# Patient Record
Sex: Male | Born: 1935
Health system: Southern US, Community
[De-identification: ages and names within clinical notes are randomized; demographics above are authoritative.]

## PROBLEM LIST (undated history)

## (undated) DIAGNOSIS — I2510 Atherosclerotic heart disease of native coronary artery without angina pectoris: Secondary | ICD-10-CM

## (undated) DIAGNOSIS — Z87442 Personal history of urinary calculi: Secondary | ICD-10-CM

## (undated) DIAGNOSIS — Z9289 Personal history of other medical treatment: Secondary | ICD-10-CM

## (undated) DIAGNOSIS — J189 Pneumonia, unspecified organism: Secondary | ICD-10-CM

## (undated) DIAGNOSIS — Z951 Presence of aortocoronary bypass graft: Secondary | ICD-10-CM

## (undated) DIAGNOSIS — E785 Hyperlipidemia, unspecified: Secondary | ICD-10-CM

## (undated) DIAGNOSIS — I10 Essential (primary) hypertension: Secondary | ICD-10-CM

## (undated) DIAGNOSIS — I4891 Unspecified atrial fibrillation: Secondary | ICD-10-CM

## (undated) HISTORY — DX: Unspecified atrial fibrillation: I48.91

## (undated) HISTORY — PX: ESOPHAGOGASTRODUODENOSCOPY: SHX1529

## (undated) HISTORY — DX: Atherosclerotic heart disease of native coronary artery without angina pectoris: I25.10

## (undated) HISTORY — DX: Essential (primary) hypertension: I10

## (undated) HISTORY — PX: TONSILLECTOMY AND ADENOIDECTOMY: SUR1326

## (undated) HISTORY — DX: Hyperlipidemia, unspecified: E78.5

## (undated) HISTORY — DX: Presence of aortocoronary bypass graft: Z95.1

---

## 1990-01-27 HISTORY — PX: CORONARY ARTERY BYPASS GRAFT: SHX141

## 2013-05-27 DIAGNOSIS — K922 Gastrointestinal hemorrhage, unspecified: Secondary | ICD-10-CM

## 2013-05-27 DIAGNOSIS — I639 Cerebral infarction, unspecified: Secondary | ICD-10-CM

## 2013-05-27 HISTORY — DX: Gastrointestinal hemorrhage, unspecified: K92.2

## 2013-05-27 HISTORY — DX: Cerebral infarction, unspecified: I63.9

## 2020-03-22 ENCOUNTER — Other Ambulatory Visit (HOSPITAL_COMMUNITY): Payer: Self-pay | Admitting: Internal Medicine

## 2020-03-22 DIAGNOSIS — R0600 Dyspnea, unspecified: Secondary | ICD-10-CM

## 2020-03-22 DIAGNOSIS — R0609 Other forms of dyspnea: Secondary | ICD-10-CM

## 2020-04-16 ENCOUNTER — Ambulatory Visit (HOSPITAL_COMMUNITY): Payer: Medicare Other | Attending: Cardiovascular Disease

## 2020-04-16 ENCOUNTER — Other Ambulatory Visit: Payer: Self-pay

## 2020-04-16 DIAGNOSIS — R0600 Dyspnea, unspecified: Secondary | ICD-10-CM | POA: Diagnosis present

## 2020-04-16 DIAGNOSIS — R0609 Other forms of dyspnea: Secondary | ICD-10-CM

## 2020-04-16 LAB — ECHOCARDIOGRAM COMPLETE
P 1/2 time: 588 msec
S' Lateral: 3.2 cm

## 2020-04-23 ENCOUNTER — Telehealth: Payer: Self-pay

## 2020-04-23 NOTE — Telephone Encounter (Signed)
This will need to be forwarded to the Medical records department at [facility] office.

## 2020-04-23 NOTE — Telephone Encounter (Signed)
Patient's wife called and wanted to see if we can fax over his Echo results to his cardiologist Dr. Daryel November. Fax # is [phone].

## 2020-05-15 ENCOUNTER — Other Ambulatory Visit: Payer: Self-pay | Admitting: Internal Medicine

## 2020-05-15 DIAGNOSIS — R0609 Other forms of dyspnea: Secondary | ICD-10-CM

## 2020-05-15 DIAGNOSIS — R0600 Dyspnea, unspecified: Secondary | ICD-10-CM

## 2020-05-28 ENCOUNTER — Other Ambulatory Visit: Payer: Self-pay

## 2020-05-28 ENCOUNTER — Ambulatory Visit
Admission: RE | Admit: 2020-05-28 | Discharge: 2020-05-28 | Disposition: A | Discharge status: Home / Self Care | Payer: Medicare Other | Source: Ambulatory Visit | Attending: Internal Medicine | Admitting: Internal Medicine

## 2020-05-28 DIAGNOSIS — R0609 Other forms of dyspnea: Secondary | ICD-10-CM

## 2020-05-28 DIAGNOSIS — R0600 Dyspnea, unspecified: Secondary | ICD-10-CM

## 2020-06-28 ENCOUNTER — Ambulatory Visit (INDEPENDENT_AMBULATORY_CARE_PROVIDER_SITE_OTHER): Payer: Medicare Other | Admitting: Pulmonary Disease

## 2020-06-28 ENCOUNTER — Encounter: Payer: Self-pay | Admitting: Pulmonary Disease

## 2020-06-28 ENCOUNTER — Other Ambulatory Visit: Payer: Self-pay

## 2020-06-28 VITALS — BP 120/72 | HR 58 | Temp 98.1°F | Ht 72.0 in | Wt 205.6 lb

## 2020-06-28 DIAGNOSIS — R269 Unspecified abnormalities of gait and mobility: Secondary | ICD-10-CM | POA: Insufficient documentation

## 2020-06-28 DIAGNOSIS — E785 Hyperlipidemia, unspecified: Secondary | ICD-10-CM | POA: Insufficient documentation

## 2020-06-28 DIAGNOSIS — D6859 Other primary thrombophilia: Secondary | ICD-10-CM | POA: Insufficient documentation

## 2020-06-28 DIAGNOSIS — E559 Vitamin D deficiency, unspecified: Secondary | ICD-10-CM | POA: Insufficient documentation

## 2020-06-28 DIAGNOSIS — I6990 Unspecified sequelae of unspecified cerebrovascular disease: Secondary | ICD-10-CM | POA: Insufficient documentation

## 2020-06-28 DIAGNOSIS — J84112 Idiopathic pulmonary fibrosis: Secondary | ICD-10-CM | POA: Diagnosis not present

## 2020-06-28 DIAGNOSIS — J449 Chronic obstructive pulmonary disease, unspecified: Secondary | ICD-10-CM

## 2020-06-28 DIAGNOSIS — N281 Cyst of kidney, acquired: Secondary | ICD-10-CM | POA: Insufficient documentation

## 2020-06-28 DIAGNOSIS — I4891 Unspecified atrial fibrillation: Secondary | ICD-10-CM | POA: Insufficient documentation

## 2020-06-28 DIAGNOSIS — R0600 Dyspnea, unspecified: Secondary | ICD-10-CM | POA: Insufficient documentation

## 2020-06-28 DIAGNOSIS — M21372 Foot drop, left foot: Secondary | ICD-10-CM | POA: Insufficient documentation

## 2020-06-28 DIAGNOSIS — N401 Enlarged prostate with lower urinary tract symptoms: Secondary | ICD-10-CM | POA: Insufficient documentation

## 2020-06-28 DIAGNOSIS — R0609 Other forms of dyspnea: Secondary | ICD-10-CM | POA: Insufficient documentation

## 2020-06-28 DIAGNOSIS — R609 Edema, unspecified: Secondary | ICD-10-CM | POA: Insufficient documentation

## 2020-06-28 DIAGNOSIS — D692 Other nonthrombocytopenic purpura: Secondary | ICD-10-CM | POA: Insufficient documentation

## 2020-06-28 DIAGNOSIS — I10 Essential (primary) hypertension: Secondary | ICD-10-CM | POA: Insufficient documentation

## 2020-06-28 MED ORDER — ANORO ELLIPTA 62.5-25 MCG/INH IN AEPB: 1.0000 | INHALATION_SPRAY | Freq: Every day | RESPIRATORY_TRACT | 0 refills | Status: DC

## 2020-06-28 NOTE — Progress Notes (Signed)
Subjective:   PATIENT ID: Calvin Brown GENDER: male DOB: 04/27/1933, MRN: [id number]   HPI  Chief Complaint  Patient presents with   Consult    Referred by PCP Dr. Thornell Mule for increased SOB for the last year. Had a CT scan back on 05/28/20 that was abnormal. Denies any coughing or chest pain.     Reason for Visit: New consult for ILD  [name] is a 87 year old male former smoker with CAD s/p CABG, COPD, HTN, CVA, atrial fibrillation Xarelto.  He was referred by his PCP, Dr. Thornell Mule, for findings of ILD on recent CT Chest. Note from 05/04/20 reviewed. Shortness suspected from deconditioning. Wife provides additional history documented below.  He reports gradually shortness of breath that worsened in the last year. Occasional wheezing at night. He has had limited activity including walking, biking. Less active in the last few years. He is participating in physical therapy for left foot drop (7 years ago) stumbling.   Reportedly had COPD on PFTs 10 years day. He has been taking Anoro 6 weeks ago and has not needed to use his albuterol. Wheezing may be improved on inhaler.  Social History: Retired Investment banker, operational  I have personally reviewed patient's past medical/family/social history, allergies, current medications.  Past Medical History:  Diagnosis Date   CAD (coronary artery disease)    CVA (cerebral vascular accident) (HCC) 05/27/2013   GI bleed 05/27/2013   HTN (hypertension)    Hx of CABG    Possibly 2015?     Family History  Problem Relation Age of Onset   Vision loss Mother    Breast cancer Mother 9   Hypertension Father    Heart attack Sister 19     Social History   Occupational History   Not on file  Tobacco Use   Smoking status: Former Smoker    Packs/day: 2.00    Types: Cigarettes    Start date: 01/28/1955    Quit date: 01/28/1967    Years since quitting: 53.4   Smokeless tobacco: Never Used  Substance and Sexual Activity   Alcohol use: Not on  file   Drug use: Not on file   Sexual activity: Not on file    No Known Allergies   Outpatient Medications Prior to Visit  Medication Sig Dispense Refill   amLODipine (NORVASC) 5 MG tablet 1 tablet     lisinopril-hydrochlorothiazide (ZESTORETIC) 20-12.5 MG tablet Take 1 tablet by mouth daily.     Rivaroxaban (XARELTO) 15 MG TABS tablet Take 15 mg by mouth daily.     rosuvastatin (CRESTOR) 20 MG tablet Take 20 mg by mouth daily.     tamsulosin (FLOMAX) 0.4 MG CAPS capsule Take 0.4 mg by mouth daily.     umeclidinium-vilanterol (ANORO ELLIPTA) 62.5-25 MCG/INH AEPB 1 puff daily.     No facility-administered medications prior to visit.    Review of Systems  Constitutional:  Negative for chills, diaphoresis, fever, malaise/fatigue and weight loss.  HENT:  Negative for congestion, ear pain and sore throat.   Respiratory:  Positive for shortness of breath. Negative for cough, hemoptysis, sputum production and wheezing.   Cardiovascular:  Positive for leg swelling. Negative for chest pain and palpitations.  Gastrointestinal:  Negative for abdominal pain, heartburn and nausea.  Genitourinary:  Negative for frequency.  Musculoskeletal:  Negative for joint pain and myalgias.  Skin:  Negative for itching and rash.  Neurological:  Negative for dizziness, weakness and headaches.  Endo/Heme/Allergies:  Does not bruise/bleed  easily.  Psychiatric/Behavioral:  Negative for depression. The patient is not nervous/anxious.     Objective:   Vitals:   06/28/20 0929  BP: 120/72  Pulse: (!) 58  Temp: 98.1 F (36.7 C)  TempSrc: Temporal  SpO2: 98%  Weight: 205 lb 9.6 oz (93.3 kg)  Height: 6' (1.829 m)   SpO2: 98 % (on RA)  Physical Exam: General: Well-appearing, no acute distress HENT: [place], AT Eyes: EOMI, no scleral icterus Respiratory: Clear to auscultation bilaterally.  No crackles, wheezing or rales Cardiovascular: RRR, -M/R/G, no JVD Extremities:-Edema,-tenderness Neuro: AAO x4,  CNII-XII grossly intact Skin: Intact, no rashes or bruising Psych: Normal mood, normal affect  Data Reviewed:  Imaging: CT chest 05/28/2020-mild subpleural groundglass predominantly in the bases.  Bibasilar reticulation and interstitial thickening associated with bronchiectasis.  Mild honeycombing in the lung bases bilaterally.  Posterior right upper lobe tree-in-bud with micro nodularity noted  PFT: None on file  Labs: OSH 03/13/20 Absolute Eos 500     Assessment & Plan:   Discussion: 28 year former smoker (24 pack-years) who presents with one year history of gradually worsening shortness of breath and wheezing. We reviewed his CT together in clinic which demonstrates mild fibrosis and honeycombing in the lung bases bilaterally associated with bronchiectasis consistent with IPF. He does have tree-in-bud nodularity in the RUL which could represent atypical infection. Will re-evaluate in 6-12 months pending bronchodilator response. If symptoms persistent, we will consider repeat imaging.  Suspected obstructive and restrictive defect --CONTINUE ANORO ONE puff ONCE a day. Sample provided. --CONTINUE Albuterol as needed for shortness of breath or wheezing  Idiopathic pulmonary fibrosis --Consider anti-fibrotics pending PFT results --Defer auto-immune work-up. No biopsy indicated based on imaging which is diagnostic  Health Maintenance  There is no immunization history on file for this patient. CT Lung Screen - not qualified. Quit 53 years ago  Orders Placed This Encounter  Procedures   Pulmonary function test    Standing Status:   Future    Standing Expiration Date:   06/28/2021    Order Specific Question:   Where should this test be performed?    Answer:   [facility]    Order Specific Question:   Full PFT: includes the following: basic spirometry, spirometry pre & post bronchodilator, diffusion capacity (DLCO), lung volumes    Answer:   Full PFT   Meds ordered this  encounter  Medications   umeclidinium-vilanterol (ANORO ELLIPTA) 62.5-25 MCG/INH AEPB    Sig: Inhale 1 puff into the lungs daily.    Dispense:  2 each    Refill:  0    Order Specific Question:   Lot Number?    Answer:   [id number]    Order Specific Question:   Expiration Date?    Answer:   07/26/2021    Order Specific Question:   Manufacturer?    Answer:   [employer] [[id number]]    Return in about 1 month (around 07/28/2020).  I have spent a total time of 45-minutes on the day of the appointment reviewing prior documentation, coordinating care and discussing medical diagnosis and plan with the patient/family. Imaging, labs and tests included in this note have been reviewed and interpreted independently by me.  Matisse Roskelley Mechele Collin, MD [facility] 06/28/2020 9:53 AM  Office Number [phone]

## 2020-06-28 NOTE — Patient Instructions (Addendum)
Suspected obstructive and restrictive defect --CONTINUE ANORO ONE puff ONCE a day. Sample provided. --CONTINUE Albuterol as needed for shortness of breath or wheezing  Idiopathic pulmonary fibrosis --Consider anti-fibrotics pending PFT results --Defer auto-immune work-up. No biopsy indicated based on imaging which is diagnostic  Follow-up after PFTs in July

## 2020-07-18 DIAGNOSIS — J84112 Idiopathic pulmonary fibrosis: Secondary | ICD-10-CM | POA: Insufficient documentation

## 2020-08-07 ENCOUNTER — Ambulatory Visit (INDEPENDENT_AMBULATORY_CARE_PROVIDER_SITE_OTHER): Payer: Medicare Other | Admitting: Pulmonary Disease

## 2020-08-07 ENCOUNTER — Encounter: Payer: Self-pay | Admitting: Pulmonary Disease

## 2020-08-07 ENCOUNTER — Other Ambulatory Visit: Payer: Self-pay

## 2020-08-07 VITALS — BP 114/68 | HR 58 | Temp 97.7°F | Ht 70.5 in | Wt 205.8 lb

## 2020-08-07 DIAGNOSIS — J449 Chronic obstructive pulmonary disease, unspecified: Secondary | ICD-10-CM | POA: Diagnosis not present

## 2020-08-07 DIAGNOSIS — J84112 Idiopathic pulmonary fibrosis: Secondary | ICD-10-CM

## 2020-08-07 DIAGNOSIS — R0600 Dyspnea, unspecified: Secondary | ICD-10-CM

## 2020-08-07 DIAGNOSIS — R0609 Other forms of dyspnea: Secondary | ICD-10-CM

## 2020-08-07 LAB — PULMONARY FUNCTION TEST
DL/VA % pred: 90 %
DL/VA: 3.44 ml/min/mmHg/L
DLCO cor % pred: 71 %
DLCO cor: 17.15 ml/min/mmHg
DLCO unc % pred: 71 %
DLCO unc: 17.15 ml/min/mmHg
FEF 25-75 Post: 2.17 L/sec
FEF 25-75 Pre: 1.59 L/sec
FEF2575-%Change-Post: 36 %
FEF2575-%Pred-Post: 122 %
FEF2575-%Pred-Pre: 89 %
FEV1-%Change-Post: 9 %
FEV1-%Pred-Post: 88 %
FEV1-%Pred-Pre: 81 %
FEV1-Post: 2.42 L
FEV1-Pre: 2.22 L
FEV1FVC-%Change-Post: 2 %
FEV1FVC-%Pred-Pre: 105 %
FEV6-%Change-Post: 5 %
FEV6-%Pred-Post: 86 %
FEV6-%Pred-Pre: 81 %
FEV6-Post: 3.12 L
FEV6-Pre: 2.95 L
FEV6FVC-%Change-Post: 0 %
FEV6FVC-%Pred-Post: 105 %
FEV6FVC-%Pred-Pre: 106 %
FVC-%Change-Post: 6 %
FVC-%Pred-Post: 81 %
FVC-%Pred-Pre: 76 %
FVC-Post: 3.19 L
FVC-Pre: 2.99 L
Post FEV1/FVC ratio: 76 %
Post FEV6/FVC ratio: 98 %
Pre FEV1/FVC ratio: 74 %
Pre FEV6/FVC Ratio: 99 %
RV % pred: 95 %
RV: 2.67 L
TLC % pred: 81 %
TLC: 5.82 L

## 2020-08-07 MED ORDER — FLUTICASONE FUROATE-VILANTEROL 100-25 MCG/INH IN AEPB: 1.0000 | INHALATION_SPRAY | Freq: Every day | RESPIRATORY_TRACT | 3 refills | Status: DC

## 2020-08-07 MED ORDER — FLUTICASONE FUROATE-VILANTEROL 200-25 MCG/INH IN AEPB: 1.0000 | INHALATION_SPRAY | Freq: Every day | RESPIRATORY_TRACT | 0 refills | Status: DC

## 2020-08-07 NOTE — Patient Instructions (Addendum)
Idiopathic pulmonary fibrosis PFTs with mildly reduced DLCO (gas exchange). No evidence of COPD. Removed COPD from problem list --Defer auto-immune work-up. No biopsy indicated based on imaging which is diagnostic --No indication for oxygen therapy --No indication for antifibrotics at this time --Recommend annual PFTs to monitor for progression  Chronic bronchitis manifesting as dyspnea and wheezing --START Breo 100-25 mcg ONE puff ONCE a day --CONTINUE Albuterol as needed for shortness of breath and wheezing  Follow-up with me in 3 months

## 2020-08-07 NOTE — Progress Notes (Signed)
Subjective:   PATIENT ID: Calvin Brown GENDER: male DOB: 07-Mar-1933, MRN: [id number]   HPI  Chief Complaint  Patient presents with   Follow-up    PFT performed today.  Pt states he has been doing fine since last visit and denies any complaints with breathing. Pt still becomes SOB with activities; denies any real complaints of cough.    Reason for Visit: Follow-up  [name] is a 87 year old male former smoker with CAD s/p CABG, COPD, HTN, CVA, atrial fibrillation Xarelto.  Synopsis: He was referred by his PCP, Dr. Thornell Mule, for findings of ILD on recent CT Chest. Note from 05/04/20 reviewed. Shortness suspected from deconditioning. Reportedly had COPD on PFTs 10 years ago.  He reports gradually shortness of breath that worsened in the last year. Occasional wheezing at night. He has had limited activity including walking, biking. Less active in the last few years. He is participating in physical therapy for left foot drop (7 years ago) stumbling.   Since our last visit, he does not feel anoro makes a difference. He has tired it for at least two months. He has shortness of breath with exertion. His wife reports that this limits his activity with short distances. Albuterol will help with wheezing sometimes  Social History: Retired Investment banker, operational  Past Medical History:  Diagnosis Date   CAD (coronary artery disease)    CVA (cerebral vascular accident) (HCC) 05/27/2013   GI bleed 05/27/2013   HTN (hypertension)    Hx of CABG    Possibly 2015?    No Known Allergies   Outpatient Medications Prior to Visit  Medication Sig Dispense Refill   amLODipine (NORVASC) 5 MG tablet 1 tablet     lisinopril-hydrochlorothiazide (ZESTORETIC) 20-12.5 MG tablet Take 1 tablet by mouth daily.     Rivaroxaban (XARELTO) 15 MG TABS tablet Take 15 mg by mouth daily.     rosuvastatin (CRESTOR) 20 MG tablet Take 20 mg by mouth daily.     tamsulosin (FLOMAX) 0.4 MG CAPS capsule Take 0.4 mg by mouth  daily.     umeclidinium-vilanterol (ANORO ELLIPTA) 62.5-25 MCG/INH AEPB 1 puff daily.     umeclidinium-vilanterol (ANORO ELLIPTA) 62.5-25 MCG/INH AEPB Inhale 1 puff into the lungs daily. 2 each 0   No facility-administered medications prior to visit.    Review of Systems  Constitutional:  Negative for chills, diaphoresis, fever, malaise/fatigue and weight loss.  HENT:  Negative for congestion.   Respiratory:  Positive for shortness of breath and wheezing. Negative for cough, hemoptysis and sputum production.   Cardiovascular:  Negative for chest pain, palpitations and leg swelling.    Objective:   Vitals:   08/07/20 1045  BP: 114/68  Pulse: (!) 58  Temp: 97.7 F (36.5 C)  TempSrc: Oral  SpO2: 98%  Weight: 205 lb 12.8 oz (93.4 kg)  Height: 5' 10.5" (1.791 m)      Physical Exam: General: Well-appearing, no acute distress HENT: [place], AT Eyes: EOMI, no scleral icterus Respiratory: Clear to auscultation bilaterally.  No crackles, wheezing or rales Cardiovascular: RRR, -M/R/G, no JVD Extremities:-Edema,-tenderness Neuro: AAO x4, CNII-XII grossly intact Psych: Normal mood, normal affect  Data Reviewed:  Imaging: CT chest 05/28/2020-mild subpleural groundglass predominantly in the bases.  Bibasilar reticulation and interstitial thickening associated with bronchiectasis.  Mild honeycombing in the lung bases bilaterally.  Posterior right upper lobe tree-in-bud with micro nodularity noted  PFT: 08/07/20 FVC 3.19 (81%) FEV1 2.42 (88%) Ratio 74  TLC 81% DLCO 71% Interpretation:  No evidence of obstructive or restrictive defect. However DLCO mildly reduced in setting of low-normal TLC. This would consistent with his known ILD   Echocardiogram: 07/15/16 [facility] report) - EF 55-60%. Normal valves. Mild AI, mild MR, mild PI and mild TR. Mild left atrial and mild left ventricular enlargement. Normal estimated PA systolic pressure  Labs: OSH 03/13/20 Absolute Eos 500  Assessment &  Plan:   Discussion: 87 year old male former smoker (24 pack-years) who presents for follow-up. Initially presented with one year history of gradually worsening shortness of breath and wheezing. CT with mild bibasilar fibrosis and honeycombing associated with traction bronchiectasis consistent with IPF. Tree-in-bud nodularity in RUL noted which could represent atypical infection. Tolerating bronchodilators however Anoro not effective. Will start ICS/LABA. If inhalers do not provide relief, consider bronchoscopy in 3-6 months  Idiopathic pulmonary fibrosis PFTs with mildly reduced DLCO (gas exchange). No evidence of COPD. Removed COPD from problem list --Defer auto-immune work-up. No biopsy indicated based on imaging which is diagnostic --No indication for oxygen therapy --No indication for antifibrotics at this time --Recommend annual PFTs to monitor for progression  Chronic bronchitis manifesting as dyspnea and wheezing --START Breo 100-25 mcg ONE puff ONCE a day --CONTINUE Albuterol as needed for shortness of breath and wheezing  Health Maintenance  There is no immunization history on file for this patient. CT Lung Screen - not qualified. Quit 53 years ago  No orders of the defined types were placed in this encounter.  Meds ordered this encounter  Medications   fluticasone furoate-vilanterol (BREO ELLIPTA) 100-25 MCG/INH AEPB    Sig: Inhale 1 puff into the lungs daily.    Dispense:  60 each    Refill:  3   fluticasone furoate-vilanterol (BREO ELLIPTA) 200-25 MCG/INH AEPB    Sig: Inhale 1 puff into the lungs daily.    Dispense:  14 each    Refill:  0    Order Specific Question:   Lot Number?    Answer:   [id number]    Order Specific Question:   Expiration Date?    Answer:   10/27/2020    Order Specific Question:   Manufacturer?    Answer:   [employer] [[id number]]    Order Specific Question:   Quantity    Answer:   1    No follow-ups on file.  I have spent a total time of  35-minutes on the day of the appointment reviewing prior documentation, coordinating care and discussing medical diagnosis and plan with the patient/family. Past medical history, allergies, medications were reviewed. Pertinent imaging, labs and tests included in this note have been reviewed and interpreted independently by me.  Shamonica Schadt Mechele Collin, MD [facility] 08/07/2020 8:29 AM  Office Number [phone]

## 2020-08-07 NOTE — Progress Notes (Signed)
PFT done today. 

## 2020-10-08 ENCOUNTER — Ambulatory Visit: Payer: Medicare Other | Admitting: Cardiology

## 2020-10-08 ENCOUNTER — Other Ambulatory Visit: Payer: Self-pay

## 2020-10-08 ENCOUNTER — Encounter: Payer: Self-pay | Admitting: Cardiology

## 2020-10-08 VITALS — BP 150/74 | HR 63 | Temp 98.0°F | Resp 16 | Ht 71.0 in | Wt 204.0 lb

## 2020-10-08 DIAGNOSIS — Z8719 Personal history of other diseases of the digestive system: Secondary | ICD-10-CM

## 2020-10-08 DIAGNOSIS — I2510 Atherosclerotic heart disease of native coronary artery without angina pectoris: Secondary | ICD-10-CM

## 2020-10-08 DIAGNOSIS — I10 Essential (primary) hypertension: Secondary | ICD-10-CM

## 2020-10-08 DIAGNOSIS — R0600 Dyspnea, unspecified: Secondary | ICD-10-CM

## 2020-10-08 DIAGNOSIS — J84112 Idiopathic pulmonary fibrosis: Secondary | ICD-10-CM

## 2020-10-08 DIAGNOSIS — Z87891 Personal history of nicotine dependence: Secondary | ICD-10-CM

## 2020-10-08 DIAGNOSIS — E782 Mixed hyperlipidemia: Secondary | ICD-10-CM

## 2020-10-08 DIAGNOSIS — R0609 Other forms of dyspnea: Secondary | ICD-10-CM

## 2020-10-08 DIAGNOSIS — Z7901 Long term (current) use of anticoagulants: Secondary | ICD-10-CM

## 2020-10-08 DIAGNOSIS — I700 Atherosclerosis of aorta: Secondary | ICD-10-CM

## 2020-10-08 DIAGNOSIS — Z951 Presence of aortocoronary bypass graft: Secondary | ICD-10-CM

## 2020-10-08 DIAGNOSIS — R0989 Other specified symptoms and signs involving the circulatory and respiratory systems: Secondary | ICD-10-CM

## 2020-10-08 DIAGNOSIS — I4891 Unspecified atrial fibrillation: Secondary | ICD-10-CM

## 2020-10-08 MED ORDER — LISINOPRIL 40 MG PO TABS
40.0000 mg | ORAL_TABLET | Freq: Every day | ORAL | 0 refills | Status: DC
Start: 2020-10-08 — End: 2020-12-31

## 2020-10-08 MED ORDER — HYDROCHLOROTHIAZIDE 25 MG PO TABS: 25.0000 mg | ORAL_TABLET | Freq: Every morning | ORAL | 0 refills | Status: DC

## 2020-10-08 MED ORDER — RIVAROXABAN 20 MG PO TABS: 20.0000 mg | ORAL_TABLET | Freq: Every day | ORAL | 0 refills | Status: DC

## 2020-10-08 NOTE — Progress Notes (Signed)
Date:  10/08/2020   ID:  [name], DOB Jan 15, 1934, MRN [id number]  PCP:  Charlane Ferretti, DO  Cardiologist:  Tessa Lerner, DO, [facility] (established care 10/08/2020) Former Cardiology Providers: Dr. Hyacinth Meeker  REASON FOR CONSULT: CAD and atrial fibrillation management  REQUESTING PHYSICIAN:  Charlane Ferretti, DO [address] [place],  [place] [address]  Chief Complaint  Patient presents with   Coronary Artery Disease   New Patient (Initial Visit)    HPI  Calvin Brown is a 87 y.o. male who presents to the office with a chief complaint of " management of heart disease, atrial fibrillation, hypertension and shortness of breath." Patient's past medical history and cardiovascular risk factors include: Hypertension,, hyperlipidemia, CVA in 2015, atrial fibrillation, CAD status post CABG, history of GI bleed secondary to AVM in 2015, aortic atherosclerosis, former smoker, idiopathic pulmonary fibrosis, advanced age.  He is referred to the office at the request of Charlane Ferretti, DO for evaluation of management of CAD and atrial fibrillation.  Patient presents today accompanied by his wife [name] at today's visit.  He is referred to the office for management of CAD and atrial fibrillation.  He is also requesting assistance with blood pressure management.  He originally lived in [place] and was under the care of Dr. Hyacinth Meeker his cardiologist and now is reestablishing care in [place] to be closer to his grandkids.  Patient has been experiencing shortness of breath for the last 1 year.  The symptoms are usually brought on by effort related activities such as walking and overexerting.  He has not had any near-syncope or syncopal events.  Denies orthopnea or paroxysmal nocturnal dyspnea.  He does have lower extremity swelling left worse than the right but he is also had vein grafting on the left side.  Given his shortness of breath with effort related activities he underwent myocardial perfusion imaging with  Dr. Hyacinth Meeker back in [place] in June 2022.  As per the patient he was recommended to undergo left heart catheterization to further evaluate his coronary arteries and patency of his grafts but he chose not to go forward.  Patient was diagnosed with coronary artery disease and underwent two-vessel bypass surgery back in 1992 at the age of 38.  He has not had any PCI's since then and has been managed medically.  His last left heart catheterization per patient was approximately 5 to 6 years ago.  In the work-up of his shortness of breath he has been evaluated by pulmonary medicine has been diagnosed with idiopathic pulmonary fibrosis.  He was prescribed a course of steroids; however, he has not completed it secondary to elevated blood pressures.  In addition, he was diagnosed with atrial fibrillation back in 2015 when he presented with CVA.  Since then he has been on Xarelto for thromboembolic prophylaxis.  He subsequently had a GI bleed secondary to AVMs and then the dose of Xarelto was reduced to 15 mg p.o. q. supper and he takes Pepcid with it for GI protection.  He denies prior cardioversion or atrial fibrillation ablation.  Besides that one episode of GI bleeding back in 2015 he denies any further episodes or history of intracranial bleeding.  Unfortunately, he does have a left foot drop and is predisposed to falls, last fall being June 2022.  With regards to blood pressure management medications reviewed.  Patient does bring his blood pressure log for review.  Morning SBP ranges between 130-143 mmHg and diastolic blood pressures are between 70 to 80 mmHg.  Evening SBP ranges between 545-625 mmHg and diastolic blood pressures range between 60-80 mmHg.  FUNCTIONAL STATUS: No structured exercise program or daily routine.   ALLERGIES: No Known Allergies  MEDICATION LIST PRIOR TO VISIT: Current Meds  Medication Sig   amLODipine (NORVASC) 10 MG tablet Take 10 mg by mouth daily.   hydrochlorothiazide  (HYDRODIURIL) 25 MG tablet Take 1 tablet (25 mg total) by mouth every morning.   lisinopril (ZESTRIL) 40 MG tablet Take 1 tablet (40 mg total) by mouth daily at 10 pm.   rivaroxaban (XARELTO) 20 MG TABS tablet Take 1 tablet (20 mg total) by mouth daily with supper.   rosuvastatin (CRESTOR) 20 MG tablet Take 20 mg by mouth daily.   tamsulosin (FLOMAX) 0.4 MG CAPS capsule Take 0.4 mg by mouth daily.   [DISCONTINUED] lisinopril-hydrochlorothiazide (ZESTORETIC) 20-12.5 MG tablet Take 1 tablet by mouth daily.   [DISCONTINUED] Rivaroxaban (XARELTO) 15 MG TABS tablet Take 15 mg by mouth daily.     PAST MEDICAL HISTORY: Past Medical History:  Diagnosis Date   Atrial fibrillation ([place])    CAD (coronary artery disease)    CVA (cerebral vascular accident) ([place]) 05/27/2013   GI bleed 05/27/2013   HTN (hypertension)    Hx of CABG    Possibly 2015?   Hyperlipidemia     PAST SURGICAL HISTORY: Past Surgical History:  Procedure Laterality Date   CORONARY ARTERY BYPASS GRAFT  1992   ESOPHAGOGASTRODUODENOSCOPY     TONSILLECTOMY AND ADENOIDECTOMY      FAMILY HISTORY: The patient family history includes Breast cancer (age of onset: 60) in his mother; Heart attack (age of onset: 21) in his sister; Hypertension in his father; Vision loss in his mother.  SOCIAL HISTORY:  The patient  reports that he quit smoking about 53 years ago. His smoking use included cigarettes. He started smoking about 65 years ago. He has a 24.00 pack-year smoking history. He has never used smokeless tobacco. He reports that he does not currently use alcohol. He reports that he does not use drugs.  REVIEW OF SYSTEMS: Review of Systems  Constitutional: Negative for chills and fever.  HENT:  Negative for hoarse voice and nosebleeds.   Eyes:  Negative for discharge, double vision and pain.  Cardiovascular:  Positive for dyspnea on exertion and leg swelling. Negative for chest pain, claudication, near-syncope, orthopnea,  palpitations, paroxysmal nocturnal dyspnea and syncope.  Respiratory:  Negative for hemoptysis and shortness of breath.   Musculoskeletal:  Negative for muscle cramps and myalgias.  Gastrointestinal:  Negative for abdominal pain, constipation, diarrhea, hematemesis, hematochezia, melena, nausea and vomiting.  Neurological:  Negative for dizziness and light-headedness.   PHYSICAL EXAM: Vitals with BMI 10/08/2020 08/07/2020 06/28/2020  Height $Remov'5\' 11"'beyLvt$  5' 10.5" $Remove'6\' 0"'RBjTDfv$   Weight 204 lbs 205 lbs 13 oz 205 lbs 10 oz  BMI 28.46 63.8 93.73  Systolic 428 768 115  Diastolic 74 68 72  Pulse 63 58 58    CONSTITUTIONAL: Well-developed and well-nourished. No acute distress.  SKIN: Skin is warm and dry. No rash noted. No cyanosis. No pallor. No jaundice HEAD: Normocephalic and atraumatic.  EYES: No scleral icterus MOUTH/THROAT: Moist oral membranes.  NECK: No JVD present. No thyromegaly noted. Right carotid bruits  LYMPHATIC: No visible cervical adenopathy.  CHEST Normal respiratory effort. No intercostal retractions.  Surgical scar is well-healed. LUNGS: Clear to auscultation bilaterally.  No stridor. No wheezes. No rales.  CARDIOVASCULAR: Irregularly irregular, variable B2-I2, soft holosystolic murmur heard at the apex, no gallops  or rubs. ABDOMINAL: Soft, nontender, nondistended, positive bowel sounds in all 4 quadrants, no apparent ascites.  EXTREMITIES: No peripheral edema, bilateral pitting edema left worse than the right, 2+ DP and PT pulses, chronic venous insufficiency.  So all these medications are going to [facility] HEMATOLOGIC: No significant bruising NEUROLOGIC: Oriented to person, place, and time. Nonfocal. Normal muscle tone.  PSYCHIATRIC: Normal mood and affect. Normal behavior. Cooperative  CARDIAC DATABASE: EKG: 10/08/2020 atrial fibrillation, 58 bpm, without underlying injury pattern.  Echocardiogram: 04/16/2020: LVEF 55-60%, inferior basal hypokinesis, mildly dilated LV cavity, mild LVH, RV  size and function normal, severely dilated left atrium, mild MR, moderate TR, mild AI, mild to moderate aortic valve sclerosis without stenosis, ascending aorta 39 mm.   Stress Testing: Last MPI June 2022 with Dr. Sabra Heck in [facility], will request records.   Heart Catheterization: 5 to 6 years ago, per patient we will request records.  LABORATORY DATA: No flowsheet data found.  No flowsheet data found.  Lipid Panel  No results found for: CHOL, TRIG, HDL, CHOLHDL, VLDL, LDLCALC, LDLDIRECT, LABVLDL  No components found for: NTPROBNP No results for input(s): PROBNP in the last 8760 hours. No results for input(s): TSH in the last 8760 hours.  BMP No results for input(s): NA, K, CL, CO2, GLUCOSE, BUN, CREATININE, CALCIUM, GFRNONAA, GFRAA in the last 8760 hours.  HEMOGLOBIN A1C No results found for: HGBA1C, MPG  External Labs: Collected: 09/03/2020 provided by referring physician Creatinine 1.2 mg/dL. eGFR: 57 mL/min per 1.73 m Sodium 138, potassium 4.2, chloride 102, bicarb 28 AST 22, ALT 17 Hemoglobin 13.5 g/dL, hematocrit 38.4% Lipid profile: Total cholesterol 143, triglycerides 70, HDL 47, LDL 82, non-HDL 96.   Apolipoprotein B 58  TSH 2.11  IMPRESSION:    ICD-10-CM   1. Dyspnea on exertion  R06.00 Pro b natriuretic peptide (BNP)    2. Coronary artery disease involving native coronary artery of native heart without angina pectoris  I25.10 EKG 12-Lead    3. Hx of CABG  Z95.1     4. Right carotid bruit  R09.89 PCV CAROTID DUPLEX (BILATERAL)    5. Atrial fibrillation, unspecified type ([place])  I48.91     6. Long term (current) use of anticoagulants  Z79.01 rivaroxaban (XARELTO) 20 MG TABS tablet    7. Mixed hyperlipidemia  E78.2     8. Benign hypertension  I10 hydrochlorothiazide (HYDRODIURIL) 25 MG tablet    lisinopril (ZESTRIL) 40 MG tablet    9. History of GI bleed  Z87.19     10. Atherosclerosis of aorta (H[facility])  I70.0     11. Former smoker  Z87.891      25. IPF (idiopathic pulmonary fibrosis) (H[facility])  J84.112        RECOMMENDATIONS: Zadyn Yardley is a 87 y.o. male whose past medical history and cardiac risk factors include: Hypertension,, hyperlipidemia, CVA in 2015, atrial fibrillation, CAD status post CABG, history of GI bleed secondary to AVM in 2015, aortic atherosclerosis, former smoker, idiopathic pulmonary fibrosis, advanced age.  Dyspnea on exertion Multifactorial: Idiopathic pulmonary fibrosis, questionable progressive CAD with a history of CABG, former smoker, elevated blood pressures Patient recently had an ischemic evaluation with Dr. Sabra Heck in [place] in June 2022.  We will request records Blood pressure medications titrated as noted below. Most recent echocardiogram from March 2022 independently reviewed and summarized above. Will defer management of pulmonary fibrosis to his pulmonologist. Check BNP  Coronary artery disease involving native artery of native heart without angina pectoris Echocardiogram  notes preserved LVEF with regional wall motion abnormalities. Patient states that his last cath about 5 to 6 years ago noted that the back artery is occluded. He had a recent MPI in June 2022 and was recommended to have a cath the patient did not proceed forward. I have asked him to seek medical attention by going to the closest ER via EMS if his symptoms increase in intensity, frequency, duration, or has typical chest pain. Not on beta-blockers as he is bradycardic. Not on aspirin given his history of GI bleed and AVMs.  Currently on Xarelto. Continue statin therapy  Right carotid bruit Check carotid duplex  Atrial fibrillation, unspecified type ([place]) Rate control: N/A, already bradycardic. Rhythm control: N/A. Thromboembolic prophylaxis: Xarelto CHA2DS2-VASc SCORE is 6 which correlates to 9.6% risk of stroke per year, ( Age, hypertension, history of stroke, aortic atherosclerosis).   Long term (current) use of  anticoagulants Currently on Xarelto 15 mg p.o. every morning. Given his creatinine clearance recommend Xarelto 20 mg p.o. q. supper.  New prescription provided. Does not endorse evidence of bleeding. History of GI bleeding back in 2015 due to AVMs. Recommend considering being evaluated for watchman device given his elevated CHA2DS2-VASc score and history of GI bleed.  Patient and wife to consider.  Mixed hyperlipidemia Outside labs independently reviewed. Currently on Crestor 20 mg p.o. nightly.  Does not endorse any myalgias. Recommend goal LDL less than 70 mg/dL.  Benign hypertension Patient is requesting assistance with blood pressure management. Currently taking all of his medication pills and nights. Recommended taking amlodipine in the morning.  And will increase hydrochlorothiazide to 25 mg p.o. every morning Increase lisinopril to 40 mg p.o. every afternoon Blood work in 1 week to evaluate kidney function and electrolytes  History of GI bleed History of AVMs. Last episode of GI bleed in 2015. Currently on oral anticoagulation due to elevated CHA2DS2-VASc score. With regards to alternatives to oral anticoagulation discussed left atrial appendage closure device i.e. watchman.  Patient and wife will consider.  IPF (idiopathic pulmonary fibrosis) ([place]) Recommended that he follows up with pulmonary medicine provider regularly as idiopathic pulmonary fibrosis can sometimes lead to pulmonary hypertension.  Most recent echocardiogram notes normal RVSP.  FINAL MEDICATION LIST END OF ENCOUNTER: Meds ordered this encounter  Medications   rivaroxaban (XARELTO) 20 MG TABS tablet    Sig: Take 1 tablet (20 mg total) by mouth daily with supper.    Dispense:  90 tablet    Refill:  0   hydrochlorothiazide (HYDRODIURIL) 25 MG tablet    Sig: Take 1 tablet (25 mg total) by mouth every morning.    Dispense:  90 tablet    Refill:  0   lisinopril (ZESTRIL) 40 MG tablet    Sig: Take 1 tablet  (40 mg total) by mouth daily at 10 pm.    Dispense:  90 tablet    Refill:  0     Medications Discontinued During This Encounter  Medication Reason   albuterol (VENTOLIN HFA) 108 (90 Base) MCG/ACT inhaler Error   fluticasone furoate-vilanterol (BREO ELLIPTA) 100-25 MCG/INH AEPB Error   fluticasone furoate-vilanterol (BREO ELLIPTA) 200-25 MCG/INH AEPB Error   Rivaroxaban (XARELTO) 15 MG TABS tablet Discontinued by provider   lisinopril-hydrochlorothiazide (ZESTORETIC) 20-12.5 MG tablet Discontinued by provider     Current Outpatient Medications:    amLODipine (NORVASC) 10 MG tablet, Take 10 mg by mouth daily., Disp: , Rfl:    hydrochlorothiazide (HYDRODIURIL) 25 MG tablet, Take 1 tablet (25 mg  total) by mouth every morning., Disp: 90 tablet, Rfl: 0   lisinopril (ZESTRIL) 40 MG tablet, Take 1 tablet (40 mg total) by mouth daily at 10 pm., Disp: 90 tablet, Rfl: 0   rivaroxaban (XARELTO) 20 MG TABS tablet, Take 1 tablet (20 mg total) by mouth daily with supper., Disp: 90 tablet, Rfl: 0   rosuvastatin (CRESTOR) 20 MG tablet, Take 20 mg by mouth daily., Disp: , Rfl:    tamsulosin (FLOMAX) 0.4 MG CAPS capsule, Take 0.4 mg by mouth daily., Disp: , Rfl:   Orders Placed This Encounter  Procedures   Pro b natriuretic peptide (BNP)   EKG 12-Lead   PCV CAROTID DUPLEX (BILATERAL)    There are no Patient Instructions on file for this visit.   --Continue cardiac medications as reconciled in final medication list. --Return in about 6 weeks (around 11/19/2020) for Follow up, Dyspnea, CAD, A. fib. Or sooner if needed. --Continue follow-up with your primary care physician regarding the management of your other chronic comorbid conditions.  Patient's questions and concerns were addressed to his satisfaction. He voices understanding of the instructions provided during this encounter.   This note was created using a voice recognition software as a result there may be grammatical errors inadvertently  enclosed that do not reflect the nature of this encounter. Every attempt is made to correct such errors.  Rex Kras, [place], [facility]  Pager: [phone] Office: [phone]

## 2020-10-09 ENCOUNTER — Other Ambulatory Visit: Payer: Self-pay | Admitting: Pulmonary Disease

## 2020-10-12 ENCOUNTER — Other Ambulatory Visit: Payer: Self-pay

## 2020-10-12 DIAGNOSIS — R0600 Dyspnea, unspecified: Secondary | ICD-10-CM

## 2020-10-12 DIAGNOSIS — R0609 Other forms of dyspnea: Secondary | ICD-10-CM

## 2020-10-18 LAB — PRO B NATRIURETIC PEPTIDE: NT-Pro BNP: 581 pg/mL — ABNORMAL HIGH (ref 0–486)

## 2020-10-23 ENCOUNTER — Ambulatory Visit: Payer: Medicare Other

## 2020-10-23 ENCOUNTER — Other Ambulatory Visit: Payer: Self-pay

## 2020-10-23 DIAGNOSIS — R0989 Other specified symptoms and signs involving the circulatory and respiratory systems: Secondary | ICD-10-CM

## 2020-11-22 ENCOUNTER — Other Ambulatory Visit: Payer: Self-pay

## 2020-11-22 ENCOUNTER — Encounter: Payer: Self-pay | Admitting: Cardiology

## 2020-11-22 ENCOUNTER — Other Ambulatory Visit: Payer: Self-pay | Admitting: Cardiology

## 2020-11-22 ENCOUNTER — Ambulatory Visit: Payer: Medicare Other | Admitting: Cardiology

## 2020-11-22 VITALS — BP 159/73 | HR 65 | Temp 98.0°F | Resp 16 | Ht 71.0 in | Wt 206.0 lb

## 2020-11-22 DIAGNOSIS — Z87891 Personal history of nicotine dependence: Secondary | ICD-10-CM

## 2020-11-22 DIAGNOSIS — R0609 Other forms of dyspnea: Secondary | ICD-10-CM

## 2020-11-22 DIAGNOSIS — J84112 Idiopathic pulmonary fibrosis: Secondary | ICD-10-CM

## 2020-11-22 DIAGNOSIS — I6523 Occlusion and stenosis of bilateral carotid arteries: Secondary | ICD-10-CM

## 2020-11-22 DIAGNOSIS — Z8719 Personal history of other diseases of the digestive system: Secondary | ICD-10-CM

## 2020-11-22 DIAGNOSIS — Z951 Presence of aortocoronary bypass graft: Secondary | ICD-10-CM

## 2020-11-22 DIAGNOSIS — I4819 Other persistent atrial fibrillation: Secondary | ICD-10-CM

## 2020-11-22 DIAGNOSIS — E782 Mixed hyperlipidemia: Secondary | ICD-10-CM

## 2020-11-22 DIAGNOSIS — I700 Atherosclerosis of aorta: Secondary | ICD-10-CM

## 2020-11-22 DIAGNOSIS — I2510 Atherosclerotic heart disease of native coronary artery without angina pectoris: Secondary | ICD-10-CM

## 2020-11-22 DIAGNOSIS — I10 Essential (primary) hypertension: Secondary | ICD-10-CM

## 2020-11-22 DIAGNOSIS — Z7901 Long term (current) use of anticoagulants: Secondary | ICD-10-CM

## 2020-11-22 MED ORDER — ROSUVASTATIN CALCIUM 40 MG PO TABS: 40.0000 mg | ORAL_TABLET | Freq: Every day | ORAL | 0 refills | Status: DC

## 2020-11-22 MED ORDER — ISOSORB DINITRATE-HYDRALAZINE 20-37.5 MG PO TABS: 1.0000 | ORAL_TABLET | Freq: Three times a day (TID) | ORAL | 2 refills | Status: DC

## 2020-11-22 NOTE — Progress Notes (Signed)
Date:  11/22/2020   ID:  Calvin Brown, DOB 1933/09/19, MRN [id number]  PCP:  Charlane Ferretti, DO  Cardiologist:  Tessa Lerner, DO, [facility] (established care 10/08/2020) Former Cardiology Providers: Dr. Hyacinth Meeker  Date: 11/22/20 Last Office Visit: 10/08/2020  Chief Complaint  Patient presents with   Dyspnea on exertion   Coronary Artery Disease   Atrial Fibrillation   Follow-up    6 week   Hypertension    HPI  Calvin Brown is a 87 y.o. male who presents to the office with a chief complaint of "reevaluation of shortness of breath and CAD." Patient's past medical history and cardiovascular risk factors include: Hypertension, hyperlipidemia, CVA in 2015, persistent atrial fibrillation, CAD status post CABG in 1992 (LIMA to the LAD and SVG to OM) at age of 71, history of GI bleed secondary to AVM in 2015, aortic atherosclerosis, former smoker, idiopathic pulmonary fibrosis, advanced age.  He is referred to the office at the request of Charlane Ferretti, DO for evaluation of management of CAD and atrial fibrillation.  Patient presents today accompanied by his wife Calvin Brown at today's visit.  He is referred to the office for management of CAD and atrial fibrillation.  He is also requesting assistance with blood pressure management.  While living in [place] he was under the care of Dr. Hyacinth Meeker and recently moved to [facility] to be closer to grandkids.  At the last office visit patient states that he was experiencing shortness of breath for approximately 1 year and recently underwent nuclear stress test with Dr. Hyacinth Meeker back in April 2022.  I did get outside records and reviewed them with the patient during today's encounter and the findings are noted below.  As per Dr. Frances Nickels last office note he had undergone two-vessel CABG LIMA to the LAD and SVG to OM.  His recent nuclear stress test from April 2022 was reported as mild anterolateral hypokinesis with fixed inferior lateral perfusion defect with some  reversibility suggestive of peri-infarct ischemia overall mildly abnormal scan.  Patient denies any chest pain at rest or with effort related activities.  But his shortness of breath are usually more pronounced with activity such as walking and overexertion.  In the interim, he has been evaluated pulmonary medicine and diagnosed with idiopathic pulmonary fibrosis.  Which may be contributing to his shortness of breath as well.  He was diagnosed with atrial fibrillation back in 2015 when he presented with CVA.  Since then has been on Xarelto for thromboembolic prophylaxis.  At the last office visit I increased the dose of Xarelto to 20 mg p.o. q. supper given his renal function.  He does have history of GI bleed secondary to AVMs and at the last office visit I did introduce the concept of left atrial appendage closure device such as watchman that he may consider being evaluated for given his history of GI bleed and history of left foot drop predisposing him to falls.  At the last office visit also uptitrated his antihypertensive medications and his systolic blood pressures have improved with SBP ranging between 130-135 mmHg.  FUNCTIONAL STATUS: No structured exercise program or daily routine.   ALLERGIES: No Known Allergies  MEDICATION LIST PRIOR TO VISIT: Current Meds  Medication Sig   amLODipine (NORVASC) 10 MG tablet Take 10 mg by mouth daily.   hydrochlorothiazide (HYDRODIURIL) 25 MG tablet Take 1 tablet (25 mg total) by mouth every morning.   lisinopril (ZESTRIL) 40 MG tablet Take 1 tablet (40 mg total) by mouth  daily at 10 pm.   rivaroxaban (XARELTO) 20 MG TABS tablet Take 1 tablet (20 mg total) by mouth daily with supper.   tamsulosin (FLOMAX) 0.4 MG CAPS capsule Take 0.4 mg by mouth daily.   [DISCONTINUED] rosuvastatin (CRESTOR) 20 MG tablet Take 20 mg by mouth daily.     PAST MEDICAL HISTORY: Past Medical History:  Diagnosis Date   Atrial fibrillation ([place])    CAD (coronary  artery disease)    CVA (cerebral vascular accident) ([place]) 05/27/2013   GI bleed 05/27/2013   HTN (hypertension)    Hx of CABG    Possibly 2015?   Hyperlipidemia     PAST SURGICAL HISTORY: Past Surgical History:  Procedure Laterality Date   CORONARY ARTERY BYPASS GRAFT  1992   ESOPHAGOGASTRODUODENOSCOPY     TONSILLECTOMY AND ADENOIDECTOMY      FAMILY HISTORY: The patient family history includes Breast cancer (age of onset: 64) in his mother; Heart attack (age of onset: 20) in his sister; Hypertension in his father; Vision loss in his mother.  SOCIAL HISTORY:  The patient  reports that he quit smoking about 53 years ago. His smoking use included cigarettes. He started smoking about 65 years ago. He has a 24.00 pack-year smoking history. He has never used smokeless tobacco. He reports that he does not currently use alcohol. He reports that he does not use drugs.  REVIEW OF SYSTEMS: Review of Systems  Constitutional: Negative for chills and fever.  HENT:  Negative for hoarse voice and nosebleeds.   Eyes:  Negative for discharge, double vision and pain.  Cardiovascular:  Positive for dyspnea on exertion and leg swelling. Negative for chest pain, claudication, near-syncope, orthopnea, palpitations, paroxysmal nocturnal dyspnea and syncope.  Respiratory:  Negative for hemoptysis and shortness of breath.   Musculoskeletal:  Negative for muscle cramps and myalgias.  Gastrointestinal:  Negative for abdominal pain, constipation, diarrhea, hematemesis, hematochezia, melena, nausea and vomiting.  Neurological:  Negative for dizziness and light-headedness.   PHYSICAL EXAM: Vitals with BMI 11/22/2020 10/08/2020 08/07/2020  Height $Remov'5\' 11"'SwVYwk$  $RemoveB'5\' 11"'EpfxSZOY$  5' 10.5"  Weight 206 lbs 204 lbs 205 lbs 13 oz  BMI 28.74 19.50 93.2  Systolic 671 245 809  Diastolic 73 74 68  Pulse 65 63 58    CONSTITUTIONAL: Well-developed and well-nourished. No acute distress.  SKIN: Skin is warm and dry. No rash noted. No  cyanosis. No pallor. No jaundice HEAD: Normocephalic and atraumatic.  EYES: No scleral icterus MOUTH/THROAT: Moist oral membranes.  NECK: No JVD present. No thyromegaly noted. Right carotid bruits  LYMPHATIC: No visible cervical adenopathy.  CHEST Normal respiratory effort. No intercostal retractions.  Surgical scar is well-healed. LUNGS: Clear to auscultation bilaterally.  No stridor. No wheezes. No rales.  CARDIOVASCULAR: Irregularly irregular, variable X8-P3, soft holosystolic murmur heard at the apex, no gallops or rubs. ABDOMINAL: Soft, nontender, nondistended, positive bowel sounds in all 4 quadrants, no apparent ascites.  EXTREMITIES: No peripheral edema, bilateral pitting edema left worse than the right, 2+ DP and PT pulses, chronic venous insufficiency.  So [facility] HEMATOLOGIC: No significant bruising NEUROLOGIC: Oriented to person, place, and time. Nonfocal. Normal muscle tone.  PSYCHIATRIC: Normal mood and affect. Normal behavior. Cooperative  CARDIAC DATABASE: EKG: 10/08/2020 atrial fibrillation, 58 bpm, without underlying injury pattern.  Echocardiogram: 04/16/2020: LVEF 55-60%, inferior basal hypokinesis, mildly dilated LV cavity, mild LVH, RV size and function normal, severely dilated left atrium, mild MR, moderate TR, mild AI, mild to moderate aortic  valve sclerosis without stenosis, ascending aorta 39 mm.   Stress Testing: Last MPI Jun April e 2022 with Dr. Sabra Heck in [place],: Per report: Perfusion study is abnormal, fixed perfusion defect seen on imaging, mild reversibility. Poor exercise tolerance. No exercise-induced arrhythmia. Normal exercise blood pressure response. No exercise-induced ECG changes. Anterolateral infarct. Mild anterolateral hypokinesis LVEF calculated 55%. Mild anterolateral hypokinesis.  Fixed inferior lateral perfusion defect with some reversibility peri-infarct.  Mildly abnormal scan  Heart  Catheterization: 5 to 6 years ago, per patient we will request records.  Carotid artery duplex 10/23/2020: Duplex suggests stenosis in the right internal carotid artery (16-49%).  Duplex suggests stenosis in the right external carotid artery (<50%). Duplex suggests stenosis in the left internal carotid artery (50-69%).  Duplex suggests stenosis in the left external carotid artery (<50%). Moderate heterogeneous plaque bilateral carotid arteries. Antegrade right vertebral artery flow. Antegrade left vertebral artery flow. Follow up in six months is appropriate if clinically indicated.  LABORATORY DATA: No flowsheet data found.  No flowsheet data found.  Lipid Panel  No results found for: CHOL, TRIG, HDL, CHOLHDL, VLDL, LDLCALC, LDLDIRECT, LABVLDL  No components found for: NTPROBNP Recent Labs    10/17/20 0952  PROBNP 581*   No results for input(s): TSH in the last 8760 hours.  BMP No results for input(s): NA, K, CL, CO2, GLUCOSE, BUN, CREATININE, CALCIUM, GFRNONAA, GFRAA in the last 8760 hours.  HEMOGLOBIN A1C No results found for: HGBA1C, MPG  External Labs: Collected: 09/03/2020 provided by referring physician Creatinine 1.2 mg/dL. eGFR: 57 mL/min per 1.73 m Sodium 138, potassium 4.2, chloride 102, bicarb 28 AST 22, ALT 17 Hemoglobin 13.5 g/dL, hematocrit 38.4% Lipid profile: Total cholesterol 143, triglycerides 70, HDL 47, LDL 82, non-HDL 96.   Apolipoprotein B 58  TSH 2.11  IMPRESSION:    ICD-10-CM   1. Bilateral carotid artery stenosis  I65.23 rosuvastatin (CRESTOR) 40 MG tablet    PCV CAROTID DUPLEX (BILATERAL)    Lipid Panel With LDL/HDL Ratio    LDL cholesterol, direct    CMP14+EGFR    2. Coronary artery disease involving native coronary artery of native heart without angina pectoris  I25.10 rosuvastatin (CRESTOR) 40 MG tablet    3. Hx of CABG  Z95.1 rosuvastatin (CRESTOR) 40 MG tablet    4. Dyspnea on exertion  R06.09     5. Persistent atrial  fibrillation (HCC)  I48.19     6. Long term (current) use of anticoagulants  Z79.01 Hemoglobin and hematocrit, blood    7. Mixed hyperlipidemia  E78.2 rosuvastatin (CRESTOR) 40 MG tablet    Lipid Panel With LDL/HDL Ratio    LDL cholesterol, direct    CMP14+EGFR    8. Benign hypertension  I10     9. History of GI bleed  Z87.19     10. Former smoker  Z87.891     71. Atherosclerosis of aorta (HCC)  I70.0     12. IPF (idiopathic pulmonary fibrosis) (HCC)  J84.112         RECOMMENDATIONS: Joshuan Bolander is a 87 y.o. male whose past medical history and cardiac risk factors include: Hypertension,, hyperlipidemia, CVA in 2015, atrial fibrillation, CAD status post CABG, history of GI bleed secondary to AVM in 2015, aortic atherosclerosis, former smoker, idiopathic pulmonary fibrosis, advanced age.  Bilateral carotid artery stenosis: Noted to have carotid bruits on physical examination and therefore underwent carotid duplex which notes bilateral stenosis left greater than right.  Follow-up study will be scheduled in 6 months  to evaluate disease progression. Increase rosuvastatin to 40 mg p.o. nightly Blood work in 6 weeks to reevaluate liver function, fasting lipid profile.  Coronary artery disease involving the native coronary arteries, history of CABG: Echo notes preserved LVEF with some degree of regional wall motion abnormality per report. MPI performed at outside facility in April 2022 notes mild reversible perfusion defect suggestive of peri-infarct ischemia. Patient has been offered to undergo left heart catheterization with Dr. Sabra Heck in the past which she has refused. Upon reviewing the nuclear stress test that was done at an outside facility, his current symptoms, patient would like to uptitrate GDMT prior to considering invasive angiography.  However, he verbalizes understanding that if his symptoms were to increase in frequency, intensity, duration, or has typical chest pain he will  go to the closest ER via EMS for further evaluation and management. Up titration of statin therapy as discussed above Currently not on aspirin given history of GI bleeds and AVM as he is on Xarelto for thromboembolic prophylaxis Start BiDil 20/37.5 mg p.o. 3 times daily Beta-blockers have been discontinued in the past secondary to bradycardia.  May reconsider the next office visit  Dyspnea on exertion Multifactorial: Idiopathic pulmonary fibrosis, questionable progressive CAD with a history of CABG, underlying atrial fibrillation, former smoker, elevated blood pressures. Ischemic work-up as discussed above Atrial fibrillation is rate controlled. Idiopathic pulmonary fibrosis management per pulmonary medicine Outside records including echo and nuclear stress test results reviewed and noted above for further reference Clinically not in congestive heart failure NT proBNP not suggestive of CHF  Atrial fibrillation, persistent Rate control: N/A, AV nodal blocking agents were discontinued in the past due to bradycardia. Rhythm control: N/A. Thromboembolic prophylaxis: Xarelto CHA2DS2-VASc SCORE is 6 which correlates to 9.6% risk of stroke per year, ( Age, hypertension, history of stroke, aortic atherosclerosis).   Long term (current) use of anticoagulants Currently on Xarelto 20 mg p.o. q. supper. Does not endorse evidence of bleeding. History of GI bleeding back in 2015 due to AVMs. Recommend considering being evaluated for watchman device given his elevated CHA2DS2-VASc score and history of GI bleed.  Patient and wife to consider.  Mixed hyperlipidemia Outside labs independently reviewed. In the setting of bilateral carotid artery stenosis discharge decision was to uptitrate statin therapy as discussed above.  Benign hypertension Patient is requesting assistance with blood pressure management. Home blood pressure log reviewed. Home blood pressure readings are improving. Medications  reconciled Will add BiDil as discussed above  History of GI bleed History of AVMs. Last episode of GI bleed in 2015. Currently on oral anticoagulation due to elevated CHA2DS2-VASc score. With regards to alternatives to oral anticoagulation discussed left atrial appendage closure device i.e. watchman.  Patient and wife will consider.  IPF (idiopathic pulmonary fibrosis) ([place]) Recommended that he follows up with pulmonary medicine provider regularly as idiopathic pulmonary fibrosis can sometimes lead to pulmonary hypertension.  Most recent echocardiogram notes normal RVSP.  FINAL MEDICATION LIST END OF ENCOUNTER: Meds ordered this encounter  Medications   rosuvastatin (CRESTOR) 40 MG tablet    Sig: Take 1 tablet (40 mg total) by mouth at bedtime for 90 doses.    Dispense:  90 tablet    Refill:  0      Medications Discontinued During This Encounter  Medication Reason   rosuvastatin (CRESTOR) 20 MG tablet Reorder      Current Outpatient Medications:    amLODipine (NORVASC) 10 MG tablet, Take 10 mg by mouth daily., Disp: , Rfl:  hydrochlorothiazide (HYDRODIURIL) 25 MG tablet, Take 1 tablet (25 mg total) by mouth every morning., Disp: 90 tablet, Rfl: 0   lisinopril (ZESTRIL) 40 MG tablet, Take 1 tablet (40 mg total) by mouth daily at 10 pm., Disp: 90 tablet, Rfl: 0   rivaroxaban (XARELTO) 20 MG TABS tablet, Take 1 tablet (20 mg total) by mouth daily with supper., Disp: 90 tablet, Rfl: 0   tamsulosin (FLOMAX) 0.4 MG CAPS capsule, Take 0.4 mg by mouth daily., Disp: , Rfl:    rosuvastatin (CRESTOR) 40 MG tablet, Take 1 tablet (40 mg total) by mouth at bedtime for 90 doses., Disp: 90 tablet, Rfl: 0  Orders Placed This Encounter  Procedures   Lipid Panel With LDL/HDL Ratio   LDL cholesterol, direct   CMP14+EGFR   Hemoglobin and hematocrit, blood   PCV CAROTID DUPLEX (BILATERAL)     There are no Patient Instructions on file for this visit.   --Continue cardiac medications as  reconciled in final medication list. --Return in about 7 weeks (around 01/10/2021) for Follow up, CAD, Lipid. Or sooner if needed. --Continue follow-up with your primary care physician regarding the management of your other chronic comorbid conditions.  Patient's questions and concerns were addressed to his satisfaction. He voices understanding of the instructions provided during this encounter.   This note was created using a voice recognition software as a result there may be grammatical errors inadvertently enclosed that do not reflect the nature of this encounter. Every attempt is made to correct such errors.  Rex Kras, [place], [facility]  Pager: [phone] Office: [phone]

## 2020-11-23 MED ORDER — ISOSORBIDE DINITRATE 20 MG PO TABS: 20.0000 mg | ORAL_TABLET | Freq: Three times a day (TID) | ORAL | 2 refills | Status: DC

## 2020-11-23 MED ORDER — HYDRALAZINE HCL 25 MG PO TABS: 25.0000 mg | ORAL_TABLET | Freq: Three times a day (TID) | ORAL | 2 refills | Status: DC

## 2020-11-23 NOTE — Telephone Encounter (Signed)
Per pharmacy "COMBO NOT COVERED BY INSURANCE. PLEASE SEND NEW RX FOR ALTERNATIVE OR BREAK INTO SEPERATE PRESCRIPTIONS. THANKS, [facility] [id number]."

## 2020-12-12 ENCOUNTER — Other Ambulatory Visit: Payer: Self-pay

## 2020-12-12 DIAGNOSIS — Z7901 Long term (current) use of anticoagulants: Secondary | ICD-10-CM

## 2020-12-12 DIAGNOSIS — E782 Mixed hyperlipidemia: Secondary | ICD-10-CM

## 2020-12-12 DIAGNOSIS — I6523 Occlusion and stenosis of bilateral carotid arteries: Secondary | ICD-10-CM

## 2020-12-27 ENCOUNTER — Encounter: Payer: Self-pay | Admitting: Pulmonary Disease

## 2020-12-27 DIAGNOSIS — R059 Cough, unspecified: Secondary | ICD-10-CM

## 2020-12-28 NOTE — Telephone Encounter (Signed)
I can see him on Monday or Tuesday in clinic for CXR and evaluation. Would not recommend further antibiotics at this time since he has completed a 10 day course. In the meantime I would recommend over the counter cough syrup.  If he is interested in something stronger like hycodan cough syrup, I can send that in but it can make him drowsy due to the narcotic.

## 2020-12-28 NOTE — Telephone Encounter (Signed)
JE please advise. Thanks  

## 2020-12-30 ENCOUNTER — Other Ambulatory Visit: Payer: Self-pay | Admitting: Cardiology

## 2020-12-30 DIAGNOSIS — I10 Essential (primary) hypertension: Secondary | ICD-10-CM

## 2020-12-31 ENCOUNTER — Ambulatory Visit (INDEPENDENT_AMBULATORY_CARE_PROVIDER_SITE_OTHER): Payer: Medicare Other

## 2020-12-31 ENCOUNTER — Ambulatory Visit (INDEPENDENT_AMBULATORY_CARE_PROVIDER_SITE_OTHER): Payer: Medicare Other | Admitting: Pulmonary Disease

## 2020-12-31 ENCOUNTER — Encounter: Payer: Self-pay | Admitting: Pulmonary Disease

## 2020-12-31 ENCOUNTER — Other Ambulatory Visit: Payer: Self-pay

## 2020-12-31 VITALS — BP 110/58 | HR 73 | Temp 98.0°F | Ht 71.0 in | Wt 202.2 lb

## 2020-12-31 DIAGNOSIS — R059 Cough, unspecified: Secondary | ICD-10-CM | POA: Diagnosis not present

## 2020-12-31 DIAGNOSIS — J159 Unspecified bacterial pneumonia: Secondary | ICD-10-CM

## 2020-12-31 DIAGNOSIS — J42 Unspecified chronic bronchitis: Secondary | ICD-10-CM

## 2020-12-31 DIAGNOSIS — J84112 Idiopathic pulmonary fibrosis: Secondary | ICD-10-CM

## 2020-12-31 MED ORDER — FLUTICASONE FUROATE-VILANTEROL 100-25 MCG/ACT IN AEPB: 1.0000 | INHALATION_SPRAY | Freq: Every day | RESPIRATORY_TRACT | 2 refills | Status: DC

## 2020-12-31 NOTE — Patient Instructions (Addendum)
Idiopathic pulmonary fibrosis --ARRANGE pulmonary function tests in July 2023  Chronic broncitis --REFILL Breo 100-25 mcg ONE puff ONCE a day  Follow-up with me in July 2023

## 2020-12-31 NOTE — Progress Notes (Signed)
Subjective:   PATIENT ID: Calvin Brown GENDER: male DOB: [birth year], MRN: [id number]   HPI  Chief Complaint  Patient presents with   Follow-up    Cough     Reason for Visit: Follow-up  Calvin Brown is a 87 year old male former smoker with CAD s/p CABG, COPD, HTN, CVA, atrial fibrillation Xarelto.  Synopsis: He was referred by his PCP, Dr. Thornell Mule, for findings of ILD on recent CT Chest. Note from 05/04/20 reviewed. Shortness suspected from deconditioning. Reportedly had COPD on PFTs 10 years ago.  He reports gradually shortness of breath that worsened in the last year. Occasional wheezing at night. He has had limited activity including walking, biking. Less active in the last few years. He is participating in physical therapy for left foot drop (7 years ago) stumbling.   Since our last visit, he does not feel anoro makes a difference. He has tired it for at least two months. He has shortness of breath with exertion.   12/31/20 After Thanksgiving, family had cold symptoms. He completed 10 days of pneumonia. Improved fatigue shortness of breath and cough. Cough has improved with some residual cough during the day. Took nyquil nighttime with improvement. He has been compliant with Breo. No wheezing.  Social History: Retired Investment banker, operational  Past Medical History:  Diagnosis Date   Atrial fibrillation (HCC)    CAD (coronary artery disease)    CVA (cerebral vascular accident) ([facility]) 05/27/2013   GI bleed 05/27/2013   HTN (hypertension)    Hx of CABG    Possibly 2015?   Hyperlipidemia     No Known Allergies   Outpatient Medications Prior to Visit  Medication Sig Dispense Refill   amLODipine (NORVASC) 10 MG tablet Take 10 mg by mouth daily.     hydrALAZINE (APRESOLINE) 25 MG tablet Take 1 tablet (25 mg total) by mouth 3 (three) times daily. 90 tablet 2   hydrochlorothiazide (HYDRODIURIL) 25 MG tablet Take 1 tablet (25 mg total) by mouth every morning. 90 tablet 0   isosorbide  dinitrate (ISORDIL) 20 MG tablet Take 1 tablet (20 mg total) by mouth 3 (three) times daily. 90 tablet 2   lisinopril (ZESTRIL) 40 MG tablet TAKE 1 TABLET (40 MG TOTAL) BY MOUTH DAILY AT 10 PM. 90 tablet 0   rivaroxaban (XARELTO) 20 MG TABS tablet Take 1 tablet (20 mg total) by mouth daily with supper. 90 tablet 0   rosuvastatin (CRESTOR) 40 MG tablet Take 1 tablet (40 mg total) by mouth at bedtime for 90 doses. 90 tablet 0   tamsulosin (FLOMAX) 0.4 MG CAPS capsule Take 0.4 mg by mouth daily.     No facility-administered medications prior to visit.    Review of Systems  Constitutional:  Negative for chills, diaphoresis, fever, malaise/fatigue and weight loss.  HENT:  Negative for congestion.   Respiratory:  Positive for cough and shortness of breath. Negative for hemoptysis, sputum production and wheezing.   Cardiovascular:  Negative for chest pain, palpitations and leg swelling.    Objective:   Vitals:   12/31/20 1044  BP: (!) 110/58  Pulse: 73  Temp: 98 F (36.7 C)  TempSrc: Oral  SpO2: 97%  Weight: 202 lb 3.2 oz (91.7 kg)  Height: 5\' 11"  (1.803 m)   SpO2: 97 % O2 Device: None (Room air)  Physical Exam: General: Well-appearing, no acute distress HENT: [place], AT Eyes: EOMI, no scleral icterus Respiratory: Clear to auscultation bilaterally.  No crackles, wheezing or rales Cardiovascular: RRR, -  M/R/G, no JVD Extremities:-Edema,-tenderness Neuro: AAO x4, CNII-XII grossly intact Psych: Normal mood, normal affect  Data Reviewed:  Imaging: CT chest 05/28/2020-mild subpleural groundglass predominantly in the bases.  Bibasilar reticulation and interstitial thickening associated with bronchiectasis.  Mild honeycombing in the lung bases bilaterally.  Posterior right upper lobe tree-in-bud with micro nodularity noted CXR 12/31/20 - Right perihilar and left basilar atelectasis/scarring  PFT: 08/07/20 FVC 3.19 (81%) FEV1 2.42 (88%) Ratio 74  TLC 81% DLCO 71% Interpretation: No  evidence of obstructive or restrictive defect. However DLCO mildly reduced in setting of low-normal TLC. This would consistent with his known ILD  Echocardiogram: 07/15/16 [facility] report) - EF 55-60%. Normal valves. Mild AI, mild MR, mild PI and mild TR. Mild left atrial and mild left ventricular enlargement. Normal estimated PA systolic pressure 04/16/20 - EF 55-60%. No valvular or WMA. Severe left atrial dilation. Mod TR regurg. Mild AI  Labs: OSH 03/13/20 [facility] 500  Assessment & Plan:   Discussion: 87 year old male former smoker (24 pack-years who presents for follow-up. Initially presented with one year history of gradually worsening shortness of breath and wheezing. CT with mild bibasilar fibrosis and honeycombing associated with traction bronchiectasis consistent with IPF. Tree-in-bud nodularity in RUL noted which could represent atypical infection.  Anoro ineffective. Breo with improved symptoms.  Idiopathic pulmonary fibrosis PFTs with mildly reduced DLCO (gas exchange). No evidence of COPD. Removed COPD from problem list --Defer auto-immune work-up. No biopsy indicated based on imaging which is diagnostic --No indication for oxygen therapy --No indication for antifibrotics at this time --Recommend annual PFTs to monitor for progression. Arrange for 07/2021  Chronic bronchitis manifesting as dyspnea and wheezing --REFILL Breo 100-25 mcg ONE puff ONCE a day --CONTINUE Albuterol as needed for shortness of breath and wheezing  Health Maintenance Immunization History  Administered Date(s) Administered   Fluad Quad(high Dose 65+) 11/22/2020   Influenza Split 11/16/2019   Moderna Sars-Covid-2 Vaccination 02/17/2019, 03/08/2019, 12/28/2019, 05/23/2020   CT Lung Screen - not qualified. Quit 53 years ago  Orders Placed This Encounter  Procedures   Pulmonary function test    Standing Status:   Future    Standing Expiration Date:   12/31/2021    Order Specific Question:    Where should this test be performed?    Answer:   [facility]    Order Specific Question:   Full PFT: includes the following: basic spirometry, spirometry pre & post bronchodilator, diffusion capacity (DLCO), lung volumes    Answer:   Full PFT   Meds ordered this encounter  Medications   fluticasone furoate-vilanterol (BREO ELLIPTA) 100-25 MCG/ACT AEPB    Sig: Inhale 1 puff into the lungs daily.    Dispense:  60 each    Refill:  2   No follow-ups on file.  I have spent a total time of 33-minutes on the day of the appointment reviewing prior documentation, coordinating care and discussing medical diagnosis and plan with the patient/family. Past medical history, allergies, medications were reviewed. Pertinent imaging, labs and tests included in this note have been reviewed and interpreted independently by me.  Shirlee Whitmire Mechele Collin, MD [facility] 12/31/2020 10:59 AM  Office Number [phone]

## 2021-01-01 LAB — CMP14+EGFR
ALT: 17 IU/L (ref 0–44)
AST: 27 IU/L (ref 0–40)
Albumin/Globulin Ratio: 1.6 (ref 1.2–2.2)
Albumin: 4.1 g/dL (ref 3.6–4.6)
Alkaline Phosphatase: 52 IU/L (ref 44–121)
BUN/Creatinine Ratio: 19 (ref 10–24)
BUN: 20 mg/dL (ref 8–27)
Bilirubin Total: 0.4 mg/dL (ref 0.0–1.2)
CO2: 26 mmol/L (ref 20–29)
Calcium: 9.2 mg/dL (ref 8.6–10.2)
Chloride: 96 mmol/L (ref 96–106)
Creatinine, Ser: 1.06 mg/dL (ref 0.76–1.27)
Globulin, Total: 2.6 g/dL (ref 1.5–4.5)
Glucose: 86 mg/dL (ref 70–99)
Potassium: 4.3 mmol/L (ref 3.5–5.2)
Sodium: 135 mmol/L (ref 134–144)
Total Protein: 6.7 g/dL (ref 6.0–8.5)
eGFR: 69 mL/min/{1.73_m2} (ref >59)

## 2021-01-01 LAB — LIPID PANEL WITH LDL/HDL RATIO
Cholesterol, Total: 96 mg/dL — ABNORMAL LOW (ref 100–199)
HDL: 41 mg/dL (ref >39)
LDL Chol Calc (NIH): 38 mg/dL (ref 0–99)
LDL/HDL Ratio: 0.9 ratio (ref 0.0–3.6)
Triglycerides: 83 mg/dL (ref 0–149)
VLDL Cholesterol Cal: 17 mg/dL (ref 5–40)

## 2021-01-01 LAB — LDL CHOLESTEROL, DIRECT: LDL Direct: 38 mg/dL (ref 0–99)

## 2021-01-01 LAB — HEMOGLOBIN AND HEMATOCRIT, BLOOD
Hematocrit: 35.9 % — ABNORMAL LOW (ref 37.5–51.0)
Hemoglobin: 12 g/dL — ABNORMAL LOW (ref 13.0–17.7)

## 2021-01-10 ENCOUNTER — Encounter: Payer: Self-pay | Admitting: Cardiology

## 2021-01-10 ENCOUNTER — Ambulatory Visit: Payer: Medicare Other | Admitting: Cardiology

## 2021-01-10 ENCOUNTER — Other Ambulatory Visit: Payer: Self-pay

## 2021-01-10 VITALS — BP 156/75 | HR 71 | Temp 98.0°F | Resp 16 | Ht 71.0 in | Wt 205.0 lb

## 2021-01-10 DIAGNOSIS — Z8719 Personal history of other diseases of the digestive system: Secondary | ICD-10-CM

## 2021-01-10 DIAGNOSIS — Z87891 Personal history of nicotine dependence: Secondary | ICD-10-CM

## 2021-01-10 DIAGNOSIS — Z951 Presence of aortocoronary bypass graft: Secondary | ICD-10-CM

## 2021-01-10 DIAGNOSIS — I6523 Occlusion and stenosis of bilateral carotid arteries: Secondary | ICD-10-CM

## 2021-01-10 DIAGNOSIS — E782 Mixed hyperlipidemia: Secondary | ICD-10-CM

## 2021-01-10 DIAGNOSIS — R0609 Other forms of dyspnea: Secondary | ICD-10-CM

## 2021-01-10 DIAGNOSIS — I2510 Atherosclerotic heart disease of native coronary artery without angina pectoris: Secondary | ICD-10-CM

## 2021-01-10 DIAGNOSIS — I700 Atherosclerosis of aorta: Secondary | ICD-10-CM

## 2021-01-10 DIAGNOSIS — J84112 Idiopathic pulmonary fibrosis: Secondary | ICD-10-CM

## 2021-01-10 DIAGNOSIS — I4819 Other persistent atrial fibrillation: Secondary | ICD-10-CM

## 2021-01-10 DIAGNOSIS — I10 Essential (primary) hypertension: Secondary | ICD-10-CM

## 2021-01-10 DIAGNOSIS — Z7901 Long term (current) use of anticoagulants: Secondary | ICD-10-CM

## 2021-01-10 MED ORDER — CARVEDILOL 3.125 MG PO TABS: 3.1250 mg | ORAL_TABLET | Freq: Two times a day (BID) | ORAL | 2 refills | Status: DC

## 2021-01-10 MED ORDER — HYDROCHLOROTHIAZIDE 12.5 MG PO TABS: 12.5000 mg | ORAL_TABLET | Freq: Every morning | ORAL | 0 refills | Status: DC

## 2021-01-10 NOTE — Progress Notes (Signed)
Date:  01/10/2021   ID:  [name], DOB 10-31-1933, MRN [id number]  PCP:  Charlane Ferretti, DO  Cardiologist:  Tessa Lerner, DO, [facility] (established care 10/08/2020) Former Cardiology Providers: Dr. Hyacinth Meeker  Date: 01/10/21 Last Office Visit: 11/22/2020  Chief Complaint  Patient presents with   Coronary Artery Disease   Hyperlipidemia   Follow-up    Carotid artery disease    HPI  Calvin Brown is a 87 y.o. male who presents to the office with a chief complaint of "CAD and carotid disease management." Patient's past medical history and cardiovascular risk factors include: Hypertension, hyperlipidemia, CVA in 2015, persistent atrial fibrillation, CAD status post CABG in 1992 (LIMA to the LAD and SVG to OM) at age of 44, history of GI bleed secondary to AVM in 2015, aortic atherosclerosis, former smoker, idiopathic pulmonary fibrosis, advanced age.  He is referred to the office at the request of Charlane Ferretti, DO for evaluation of management of CAD and atrial fibrillation.  He is referred to the office for management of CAD and atrial fibrillation.  He is also requesting assistance with blood pressure management.  While living in [place] he was under the care of Dr. Hyacinth Meeker and recently moved to [facility] to be closer to grandkids.  Patient had a nuclear stress test with Dr. Hyacinth Meeker back in April 2022 and based on the report there is mild anterolateral hypokinesis with fixed inferior and lateral perfusion defect with some reversibility suggesting of peri-infarct ischemia overall mildly abnormal scan.  Patient denies any chest pain at rest or with effort related activities.  His shortness of breath is chronic and stable and more pronounced with effort related activities/overexertion.  In the past we have discussed undergoing additional cardiovascular testing however the shared decision was to continue medical management given his age and being asymptomatic.  At the last office visit patient  was started on isosorbide dinitrate and hydralazine and Crestor was increased to 40 mg p.o. nightly.  Patient did well with BiDil but was not able to tolerate the higher dose of Crestor.  Therefore he is back down to his original dose of Crestor 20 mg p.o. nightly along with Zetia 10 mg p.o. daily.  Recent lab work from 12/31/2020 independently reviewed notes an LDL level of 38 mg/dL.  He also has history of atrial fibrillation diagnosed in 2015 when he presented with CVA.  He has been on Xarelto for thromboembolic prophylaxis.  He has history of GI bleed secondary to AVM which were addressed in the past.  During prior office visits we have discussed considering left atrial appendage closure device given his elevated CHA2DS2-VASc score and history of GI bleed.  However, patient would like to hold off on additional testing for now.  Patient brings in his blood pressure log for review and his systolic blood pressures are consistently around 130 mmHg.  They have been elevated today as he ran out of hydrochlorothiazide.  FUNCTIONAL STATUS: No structured exercise program or daily routine.   ALLERGIES: No Known Allergies  MEDICATION LIST PRIOR TO VISIT: Current Meds  Medication Sig   carvedilol (COREG) 3.125 MG tablet Take 1 tablet (3.125 mg total) by mouth 2 (two) times daily with a meal. Hold if systolic blood pressure (top number) less than 100 mmHg or pulse less than 55 bpm.   ezetimibe (ZETIA) 10 MG tablet Take 10 mg by mouth daily.   famotidine (PEPCID) 20 MG tablet Take 20 mg by mouth 2 (two) times daily.   fluticasone  furoate-vilanterol (BREO ELLIPTA) 100-25 MCG/ACT AEPB Inhale 1 puff into the lungs daily.   hydrALAZINE (APRESOLINE) 25 MG tablet Take 1 tablet (25 mg total) by mouth 3 (three) times daily.   isosorbide dinitrate (ISORDIL) 20 MG tablet Take 1 tablet (20 mg total) by mouth 3 (three) times daily.   lisinopril (ZESTRIL) 40 MG tablet TAKE 1 TABLET (40 MG TOTAL) BY MOUTH DAILY AT 10  PM.   naproxen (NAPROSYN) 375 MG tablet Take 375 mg by mouth 2 (two) times daily with a meal.   rivaroxaban (XARELTO) 20 MG TABS tablet Take 1 tablet (20 mg total) by mouth daily with supper.   rosuvastatin (CRESTOR) 40 MG tablet Take 1 tablet (40 mg total) by mouth at bedtime for 90 doses. (Patient taking differently: Take 20 mg by mouth at bedtime.)   tamsulosin (FLOMAX) 0.4 MG CAPS capsule Take 0.4 mg by mouth daily.   [DISCONTINUED] amLODipine (NORVASC) 10 MG tablet Take 10 mg by mouth daily.   [DISCONTINUED] hydrochlorothiazide (HYDRODIURIL) 25 MG tablet Take 1 tablet (25 mg total) by mouth every morning.     PAST MEDICAL HISTORY: Past Medical History:  Diagnosis Date   Atrial fibrillation ([place])    CAD (coronary artery disease)    CVA (cerebral vascular accident) ([place]) 05/27/2013   GI bleed 05/27/2013   HTN (hypertension)    Hx of CABG    Possibly 2015?   Hyperlipidemia     PAST SURGICAL HISTORY: Past Surgical History:  Procedure Laterality Date   CORONARY ARTERY BYPASS GRAFT  1992   ESOPHAGOGASTRODUODENOSCOPY     TONSILLECTOMY AND ADENOIDECTOMY      FAMILY HISTORY: The patient family history includes Breast cancer (age of onset: 78) in his mother; Heart attack (age of onset: 58) in his sister; Hypertension in his father; Vision loss in his mother.  SOCIAL HISTORY:  The patient  reports that he quit smoking about 53 years ago. His smoking use included cigarettes. He started smoking about 65 years ago. He has a 24.00 pack-year smoking history. He has never used smokeless tobacco. He reports that he does not currently use alcohol. He reports that he does not use drugs.  REVIEW OF SYSTEMS: Review of Systems  Constitutional: Negative for chills and fever.  HENT:  Negative for hoarse voice and nosebleeds.   Eyes:  Negative for discharge, double vision and pain.  Cardiovascular:  Positive for dyspnea on exertion (chronic and stable) and leg swelling (chronic and stable).  Negative for chest pain, claudication, near-syncope, orthopnea, palpitations, paroxysmal nocturnal dyspnea and syncope.  Respiratory:  Negative for hemoptysis and shortness of breath.   Musculoskeletal:  Negative for muscle cramps and myalgias.  Gastrointestinal:  Negative for abdominal pain, constipation, diarrhea, hematemesis, hematochezia, melena, nausea and vomiting.  Neurological:  Negative for dizziness and light-headedness.   PHYSICAL EXAM: Vitals with BMI 01/10/2021 12/31/2020 11/22/2020  Height $Remov'5\' 11"'oKBKDy$  $RemoveB'5\' 11"'xJAAVqLM$  $RemoveBef'5\' 11"'GqZrnNImSd$   Weight 205 lbs 202 lbs 3 oz 206 lbs  BMI 28.6 10.93 23.55  Systolic 732 202 542  Diastolic 75 58 73  Pulse 71 73 65    CONSTITUTIONAL: Well-developed and well-nourished. No acute distress.  SKIN: Skin is warm and dry. No rash noted. No cyanosis. No pallor. No jaundice HEAD: Normocephalic and atraumatic.  EYES: No scleral icterus MOUTH/THROAT: Moist oral membranes.  NECK: No JVD present. No thyromegaly noted. Right carotid bruits  LYMPHATIC: No visible cervical adenopathy.  CHEST Normal respiratory effort. No intercostal retractions.  Surgical scar is well-healed. LUNGS: Clear to  auscultation bilaterally.  No stridor. No wheezes. No rales.  CARDIOVASCULAR: Irregularly irregular, variable F7-P1, soft holosystolic murmur heard at the apex, no gallops or rubs. ABDOMINAL: Soft, nontender, nondistended, positive bowel sounds in all 4 quadrants, no apparent ascites.  EXTREMITIES: No peripheral edema, bilateral pitting edema left worse than the right, 2+ DP and PT pulses, chronic venous insufficiency.  So all these medications are going to [facility] HEMATOLOGIC: No significant bruising NEUROLOGIC: Oriented to person, place, and time. Nonfocal. Normal muscle tone.  PSYCHIATRIC: Normal mood and affect. Normal behavior. Cooperative  CARDIAC DATABASE: EKG: 10/08/2020 atrial fibrillation, 58 bpm, without underlying injury pattern.  Echocardiogram: 04/16/2020: LVEF 55-60%, inferior  basal hypokinesis, mildly dilated LV cavity, mild LVH, RV size and function normal, severely dilated left atrium, mild MR, moderate TR, mild AI, mild to moderate aortic valve sclerosis without stenosis, ascending aorta 39 mm.   Stress Testing: Last MPI Jun April e 2022 with Dr. Sabra Heck in [place],: Per report: Perfusion study is abnormal, fixed perfusion defect seen on imaging, mild reversibility. Poor exercise tolerance. No exercise-induced arrhythmia. Normal exercise blood pressure response. No exercise-induced ECG changes. Anterolateral infarct. Mild anterolateral hypokinesis LVEF calculated 55%. Mild anterolateral hypokinesis.  Fixed inferior lateral perfusion defect with some reversibility peri-infarct.  Mildly abnormal scan  Heart Catheterization: 5 to 6 years ago, per patient we will request records.  Carotid artery duplex 10/23/2020: Duplex suggests stenosis in the right internal carotid artery (16-49%).  Duplex suggests stenosis in the right external carotid artery (<50%). Duplex suggests stenosis in the left internal carotid artery (50-69%).  Duplex suggests stenosis in the left external carotid artery (<50%). Moderate heterogeneous plaque bilateral carotid arteries. Antegrade right vertebral artery flow. Antegrade left vertebral artery flow. Follow up in six months is appropriate if clinically indicated.  LABORATORY DATA: CBC Latest Ref Rng & Units 12/31/2020  Hemoglobin 13.0 - 17.7 g/dL 12.0(L)  Hematocrit 37.5 - 51.0 % 35.9(L)    CMP Latest Ref Rng & Units 12/31/2020  Glucose 70 - 99 mg/dL 86  BUN 8 - 27 mg/dL 20  Creatinine 0.76 - 1.27 mg/dL 1.06  Sodium 134 - 144 mmol/L 135  Potassium 3.5 - 5.2 mmol/L 4.3  Chloride 96 - 106 mmol/L 96  CO2 20 - 29 mmol/L 26  Calcium 8.6 - 10.2 mg/dL 9.2  Total Protein 6.0 - 8.5 g/dL 6.7  Total Bilirubin 0.0 - 1.2 mg/dL 0.4  Alkaline Phos 44 - 121 IU/L 52  AST 0 - 40 IU/L 27  ALT 0 - 44 IU/L 17    Lipid Panel      Component Value Date/Time   CHOL 96 (L) 12/31/2020 0919   TRIG 83 12/31/2020 0919   HDL 41 12/31/2020 0919   LDLCALC 38 12/31/2020 0919   LDLDIRECT 38 12/31/2020 0922   LABVLDL 17 12/31/2020 0919    No components found for: NTPROBNP Recent Labs    10/17/20 0952  PROBNP 581*   No results for input(s): TSH in the last 8760 hours.  BMP Recent Labs    12/31/20 0920  NA 135  K 4.3  CL 96  CO2 26  GLUCOSE 86  BUN 20  CREATININE 1.06  CALCIUM 9.2    HEMOGLOBIN A1C No results found for: HGBA1C, MPG  External Labs: Collected: 09/03/2020 provided by referring physician Creatinine 1.2 mg/dL. eGFR: 57 mL/min per 1.73 m Sodium 138, potassium 4.2, chloride 102, bicarb 28 AST 22, ALT 17 Hemoglobin 13.5 g/dL, hematocrit 38.4% Lipid profile: Total cholesterol 143, triglycerides 70,  HDL 47, LDL 82, non-HDL 96.   Apolipoprotein B 58  TSH 2.11  IMPRESSION:    ICD-10-CM   1. Coronary artery disease involving native coronary artery of native heart without angina pectoris  I25.10 carvedilol (COREG) 3.125 MG tablet    2. Hx of CABG  Z95.1     3. Dyspnea on exertion  R06.09     4. Persistent atrial fibrillation (HCC)  I48.19     5. Long term (current) use of anticoagulants  Z79.01     6. History of GI bleed  Z87.19     7. Benign hypertension  I10 hydrochlorothiazide (HYDRODIURIL) 12.5 MG tablet    carvedilol (COREG) 3.125 MG tablet    8. Bilateral carotid artery stenosis  I65.23     9. Mixed hyperlipidemia  E78.2     10. Atherosclerosis of aorta (HCC)  I70.0     11. IPF (idiopathic pulmonary fibrosis) ([place])  J84.112     12. Former smoker  Z87.891         RECOMMENDATIONS: Calvin Brown is a 87 y.o. male whose past medical history and cardiac risk factors include: Hypertension,, hyperlipidemia, CVA in 2015, atrial fibrillation, CAD status post CABG, history of GI bleed secondary to AVM in 2015, aortic atherosclerosis, former smoker, idiopathic pulmonary fibrosis,  advanced age.  Coronary artery disease involving the native coronary arteries, history of CABG: Echo notes preserved LVEF with some degree of regional wall motion abnormality per report. MPI performed at outside facility in April 2022 notes mild reversible perfusion defect suggestive of peri-infarct ischemia. Patient has been offered to undergo left heart catheterization with Dr. Sabra Heck in the past which she has refused. Upon reviewing the nuclear stress test that was done at an outside facility, his current symptoms, patient and wife would like to uptitrate GDMT prior to considering invasive angiography which is quite reasonable.  He remains asymptomatic over the last 2 months.  However, he verbalizes understanding that if his symptoms were to increase in frequency, intensity, duration, or has typical chest pain he will go to the closest ER via EMS for further evaluation and management. Has tolerated the initiation of BiDil well without any side effects or intolerances. Did not tolerate up titration of Crestor to 40 mg p.o. nightly due to GI upset.  Now on Crestor 20 mg p.o. nightly plus Zetia.  Currently not on aspirin given history of GI bleeds and AVM as he is on Xarelto for thromboembolic prophylaxis  Dyspnea on exertion Multifactorial: Idiopathic pulmonary fibrosis, questionable progressive CAD with a history of CABG, underlying atrial fibrillation, former smoker, elevated blood pressures. Ischemic work-up as discussed above Atrial fibrillation is rate controlled. Idiopathic pulmonary fibrosis management per pulmonary medicine Outside records including echo and nuclear stress test results reviewed and noted above for further reference Clinically not in congestive heart failure NT proBNP not suggestive of CHF  Atrial fibrillation, persistent Rate control: Start Coreg 3.125 mg p.o. twice daily Rhythm control: N/A. Thromboembolic prophylaxis: Xarelto CHA2DS2-VASc SCORE is 6 which  correlates to 9.6% risk of stroke per year, ( Age, hypertension, history of stroke, aortic atherosclerosis).  Of note, in the past AV nodal blocking agents were discontinued due to bradycardia.  Long term (current) use of anticoagulants /history of GI bleed: Currently on Xarelto 20 mg p.o. q. supper. Does not endorse evidence of bleeding. History of GI bleeding back in 2015 due to AVMs. Recommend considering being evaluated for watchman device given his elevated CHA2DS2-VASc score and history of GI bleed.  Patient and wife to consider.  Bilateral carotid artery stenosis: Carotid Duplex scheudled for April 2023 Currently on Crestor $RemoveBe'20mg'JiipvoqrY$  po qhs and Zeita.  Recent LDL $RemoveBe'38mg'aGXVflzkm$ /dL.  Did not tolerate Crestor $RemoveBeforeDE'40mg'drncQHHUIVCkqxd$  po qhs due to GI upset.   Mixed hyperlipidemia Outside labs independently reviewed. LDL currently at goal.  Benign hypertension Patient is requesting assistance with blood pressure management. Home blood pressure log reviewed -within good control. Patient ran out of his hydrochlorothiazide and is requesting a refill. Will decrease his hydrochlorothiazide from 25 mg p.o. daily to 12.5 mg p.o. daily and add carvedilol 3.125 mg p.o. twice daily  IPF (idiopathic pulmonary fibrosis) ([place]) Recommended that he follows up with pulmonary medicine provider regularly as idiopathic pulmonary fibrosis can sometimes lead to pulmonary hypertension.  Most recent echocardiogram notes normal RVSP.  FINAL MEDICATION LIST END OF ENCOUNTER: Meds ordered this encounter  Medications   hydrochlorothiazide (HYDRODIURIL) 12.5 MG tablet    Sig: Take 1 tablet (12.5 mg total) by mouth every morning.    Dispense:  90 tablet    Refill:  0   carvedilol (COREG) 3.125 MG tablet    Sig: Take 1 tablet (3.125 mg total) by mouth 2 (two) times daily with a meal. Hold if systolic blood pressure (top number) less than 100 mmHg or pulse less than 55 bpm.    Dispense:  60 tablet    Refill:  2      Medications  Discontinued During This Encounter  Medication Reason   amLODipine (NORVASC) 10 MG tablet Change in therapy   hydrochlorothiazide (HYDRODIURIL) 25 MG tablet Reorder      Current Outpatient Medications:    carvedilol (COREG) 3.125 MG tablet, Take 1 tablet (3.125 mg total) by mouth 2 (two) times daily with a meal. Hold if systolic blood pressure (top number) less than 100 mmHg or pulse less than 55 bpm., Disp: 60 tablet, Rfl: 2   ezetimibe (ZETIA) 10 MG tablet, Take 10 mg by mouth daily., Disp: , Rfl:    famotidine (PEPCID) 20 MG tablet, Take 20 mg by mouth 2 (two) times daily., Disp: , Rfl:    fluticasone furoate-vilanterol (BREO ELLIPTA) 100-25 MCG/ACT AEPB, Inhale 1 puff into the lungs daily., Disp: 60 each, Rfl: 2   hydrALAZINE (APRESOLINE) 25 MG tablet, Take 1 tablet (25 mg total) by mouth 3 (three) times daily., Disp: 90 tablet, Rfl: 2   isosorbide dinitrate (ISORDIL) 20 MG tablet, Take 1 tablet (20 mg total) by mouth 3 (three) times daily., Disp: 90 tablet, Rfl: 2   lisinopril (ZESTRIL) 40 MG tablet, TAKE 1 TABLET (40 MG TOTAL) BY MOUTH DAILY AT 10 PM., Disp: 90 tablet, Rfl: 0   naproxen (NAPROSYN) 375 MG tablet, Take 375 mg by mouth 2 (two) times daily with a meal., Disp: , Rfl:    rivaroxaban (XARELTO) 20 MG TABS tablet, Take 1 tablet (20 mg total) by mouth daily with supper., Disp: 90 tablet, Rfl: 0   rosuvastatin (CRESTOR) 40 MG tablet, Take 1 tablet (40 mg total) by mouth at bedtime for 90 doses. (Patient taking differently: Take 20 mg by mouth at bedtime.), Disp: 90 tablet, Rfl: 0   tamsulosin (FLOMAX) 0.4 MG CAPS capsule, Take 0.4 mg by mouth daily., Disp: , Rfl:    hydrochlorothiazide (HYDRODIURIL) 12.5 MG tablet, Take 1 tablet (12.5 mg total) by mouth every morning., Disp: 90 tablet, Rfl: 0  No orders of the defined types were placed in this encounter.    There are no Patient Instructions on file  for this visit.   --Continue cardiac medications as reconciled in final  medication list. --Return in about 6 months (around 07/11/2021) for Follow up, CAD. Or sooner if needed. --Continue follow-up with your primary care physician regarding the management of your other chronic comorbid conditions.  Patient's questions and concerns were addressed to his satisfaction. He voices understanding of the instructions provided during this encounter.   This note was created using a voice recognition software as a result there may be grammatical errors inadvertently enclosed that do not reflect the nature of this encounter. Every attempt is made to correct such errors.  Rex Kras, [place], [facility]  Pager: [phone] Office: [phone]

## 2021-01-11 ENCOUNTER — Encounter: Payer: Self-pay | Admitting: Pulmonary Disease

## 2021-01-11 DIAGNOSIS — J42 Unspecified chronic bronchitis: Secondary | ICD-10-CM | POA: Insufficient documentation

## 2021-01-24 ENCOUNTER — Encounter: Payer: Self-pay | Admitting: Cardiology

## 2021-01-25 ENCOUNTER — Telehealth: Payer: Self-pay | Admitting: Student

## 2021-01-25 DIAGNOSIS — R0609 Other forms of dyspnea: Secondary | ICD-10-CM

## 2021-01-25 NOTE — Telephone Encounter (Signed)
Call and spoke to patient and his wife. Patient has recently been treated for pneumonia and and since being diagnosed has noticed continued worsening fatigue and dyspnea. He has been followed by pulmonology. Patient's wife expressed concern that patient has gradually been "getting weaker" over the last several months. Denies chest pain, leg swelling, orthopnea.   Also concerned as morning blood pressures are well controlled at home but evening blood pressure is elevated at 150/80-90 mmHg. Patient reports medication compliance. He is currently taking lisinopril at 10PM, advised patient to take lisinopril with dinner instead.   In regard to fatigue and dyspnea this is likely multifactorial. Will obtain BNP and BMP and schedule patient for office visit within the next 2 weeks.

## 2021-01-27 ENCOUNTER — Encounter (HOSPITAL_COMMUNITY): Payer: Self-pay | Admitting: *Deleted

## 2021-01-27 ENCOUNTER — Other Ambulatory Visit: Payer: Self-pay

## 2021-01-27 ENCOUNTER — Ambulatory Visit (INDEPENDENT_AMBULATORY_CARE_PROVIDER_SITE_OTHER): Payer: Medicare Other

## 2021-01-27 ENCOUNTER — Ambulatory Visit (HOSPITAL_COMMUNITY)
Admission: EM | Admit: 2021-01-27 | Discharge: 2021-01-27 | Disposition: A | Discharge status: Home / Self Care | Payer: Medicare Other | Attending: Family Medicine | Admitting: Family Medicine

## 2021-01-27 DIAGNOSIS — R0602 Shortness of breath: Secondary | ICD-10-CM

## 2021-01-27 DIAGNOSIS — R059 Cough, unspecified: Secondary | ICD-10-CM | POA: Diagnosis not present

## 2021-01-27 MED ORDER — HYDROCHLOROTHIAZIDE 25 MG PO TABS: 25.0000 mg | ORAL_TABLET | Freq: Every day | ORAL | 0 refills | Status: DC

## 2021-01-27 NOTE — ED Triage Notes (Signed)
Pt reports SHOB worse for past 2 weeks. Pt was seen at [facility] on 01-10-21. Pt had the dose of HCTZ  decreased at that visit . Since then pt has been more SHOB .  Pt had a recent PNA.

## 2021-01-27 NOTE — Discharge Instructions (Addendum)
Your chest xray was clear. Hydrochlorothiazide prescription has been sent for 25 mg 1 daily. Touch base with your cardiologist later this week.

## 2021-01-27 NOTE — ED Provider Notes (Signed)
[facility]    CSN: [id number] Arrival date & time: 01/27/21  1036      History   Chief Complaint Chief Complaint  Patient presents with   Shortness of Breath    HPI Calvin Brown is a 88 y.o. male.    Shortness of Breath Here for DOE, has become more noticeable in the last 2 weeks.  Seen at cardiology 12/15, that note reviewed today. Has CAD/CHF/Afib and dose of HCTZ was reduced that day from 25  down to 12.5 mg, and carvedilol 3.125 mg was added. BP here today is 138/74. At home in the AM has been 112/70,  but in the evenings has been 638V-564P systolic.  BNP 581 in September. Cr 1.06/eGFR 69 earlier this month.  Past Medical History:  Diagnosis Date   Atrial fibrillation ([place])    CAD (coronary artery disease)    CVA (cerebral vascular accident) ([place]) 05/27/2013   GI bleed 05/27/2013   HTN (hypertension)    Hx of CABG    Possibly 2015?   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 Authorizing Provider  hydrochlorothiazide (HYDRODIURIL) 25 MG tablet Take 1 tablet (25 mg total) by mouth daily.  01/27/21  Yes Barrett Henle, MD  carvedilol (COREG) 3.125 MG tablet Take 1 tablet (3.125 mg total) by mouth 2 (two) times daily with a meal. Hold if systolic blood pressure (top number) less than 100 mmHg or pulse less than 55 bpm. 01/10/21 04/10/21  Tolia, Sunit, DO  ezetimibe (ZETIA) 10 MG tablet Take 10 mg by mouth daily.    [provider]  famotidine (PEPCID) 20 MG tablet Take 20 mg by mouth 2 (two) times daily.    [provider]  fluticasone furoate-vilanterol (BREO ELLIPTA) 100-25 MCG/ACT AEPB Inhale 1 puff into the lungs daily. 12/31/20   Margaretha Seeds, MD  hydrALAZINE (APRESOLINE) 25 MG tablet Take 1 tablet (25 mg total) by mouth 3 (three) times daily. 11/23/20 02/21/21  Tolia, Sunit, DO  isosorbide dinitrate (ISORDIL) 20 MG tablet Take 1 tablet (20 mg total) by mouth 3 (three) times daily. 11/23/20 02/21/21  Tolia, Sunit, DO  lisinopril (ZESTRIL) 40 MG tablet TAKE 1 TABLET (40 MG TOTAL) BY MOUTH DAILY AT 10 PM. 12/31/20 03/31/21  Tolia, Sunit, DO  naproxen (NAPROSYN) 375 MG tablet Take 375 mg by mouth 2 (two) times daily with a meal.    [provider]  rivaroxaban (XARELTO) 20 MG TABS tablet Take 1 tablet (20 mg total) by mouth daily with supper. 10/08/20 01/10/21  Tolia, Sunit, DO  rosuvastatin (CRESTOR) 40 MG tablet Take 1  tablet (40 mg total) by mouth at bedtime for 90 doses. Patient taking differently: Take 20 mg by mouth at bedtime. 11/22/20 02/20/21  Tolia, Sunit, DO  tamsulosin (FLOMAX) 0.4 MG CAPS capsule Take 0.4 mg by mouth daily. 06/28/20   [provider]    Family History Family History  Problem Relation Age of Onset   Vision loss Mother    Breast cancer Mother 54   Hypertension Father    Heart attack Sister 37    Social History Social History   Tobacco Use   Smoking status: Former    Packs/day: 2.00    Years: 12.00    Pack years: 24.00    Types: Cigarettes    Start date: 01/28/1955    Quit date: 01/28/1967    Years since  quitting: 54.0   Smokeless tobacco: Never  Vaping Use   Vaping Use: Never used  Substance Use Topics   Alcohol use: Not Currently   Drug use: Never     Allergies   Patient has no known allergies.   Review of Systems Review of Systems  Respiratory:  Positive for shortness of breath.     Physical Exam Triage Vital Signs ED Triage Vitals  Enc Vitals Group     BP 01/27/21 1153 138/74     Pulse Rate 01/27/21 1153 68     Resp 01/27/21 1153 20     Temp 01/27/21 1153 97.9 F (36.6 C)     Temp src --      SpO2 01/27/21 1153 97 %     Weight --      Height --      Head Circumference --      Peak Flow --      Pain Score 01/27/21 1152 0     Pain Loc --      Pain Edu? --      Excl. in [place]? --    No data found.  Updated Vital Signs BP 138/74    Pulse 68    Temp 97.9 F (36.6 C)    Resp 20    SpO2 97%   Visual Acuity Right Eye Distance:   Left Eye Distance:   Bilateral Distance:    Right Eye Near:   Left Eye Near:    Bilateral Near:     Physical Exam Vitals reviewed.  HENT:     Mouth/Throat:     Mouth: Mucous membranes are moist.  Eyes:     Extraocular Movements: Extraocular movements intact.     Pupils: Pupils are equal, round, and reactive to light.  Cardiovascular:     Rate and Rhythm: Normal rate.     Comments: Mildly irregular Pulmonary:     Effort: Pulmonary effort is normal.     Breath sounds: No decreased breath sounds, wheezing, rhonchi or rales (extreme lateral right lower lung base).  Musculoskeletal:     Right lower leg: Edema (2+) present.     Left lower leg: Edema (2+) present.  Skin:    Capillary Refill: Capillary refill takes less than 2 seconds.  Neurological:     General: No focal deficit present.     Mental Status: He is alert.  Psychiatric:        Behavior: Behavior normal.     UC Treatments / Results  Labs (all labs ordered are listed, but only abnormal results are displayed) Labs Reviewed - No data to  display  EKG   Radiology DG Chest 2 View  Result Date:  01/27/2021 CLINICAL DATA:  Cough and shortness of breath for 2 weeks. EXAM: CHEST - 2 VIEW COMPARISON:  December 31, 2020 FINDINGS: The heart size and mediastinal contours are stable. Mild scar of the right mid lung is unchanged. There is no focal infiltrate, pulmonary edema, or pleural effusion. The visualized skeletal structures are unremarkable. IMPRESSION: No active cardiopulmonary disease. Electronically Signed   By: Abelardo Diesel M.D.   On: 01/27/2021 13:23    Procedures Procedures (including critical care time)  Medications Ordered in UC Medications - No data to display  Initial Impression / Assessment and Plan / UC Course  I have reviewed the triage vital signs and the nursing notes.  Pertinent labs & imaging results that were available during my care of the patient were reviewed by me and considered in my medical decision making (see chart for details).     CXR read as clear. Will increase hctz back to 25 mg. They will follow bp. Final Clinical Impressions(s) / UC Diagnoses   Final diagnoses:  Shortness of breath     Discharge Instructions      Your chest xray was clear. Hydrochlorothiazide prescription has been sent for 25 mg 1 daily. Touch base with your cardiologist later this week.     ED Prescriptions     Medication Sig Dispense Auth. Provider   hydrochlorothiazide (HYDRODIURIL) 25 MG tablet Take 1 tablet (25 mg total) by mouth daily. 30 tablet Tamiya Colello, Gwenlyn Perking, MD      PDMP not reviewed this encounter.   Barrett Henle, MD 01/27/21 1355

## 2021-01-29 ENCOUNTER — Ambulatory Visit: Payer: Medicare Other | Admitting: Cardiology

## 2021-01-29 ENCOUNTER — Other Ambulatory Visit: Payer: Self-pay

## 2021-01-29 ENCOUNTER — Ambulatory Visit: Payer: Medicare Other

## 2021-01-29 ENCOUNTER — Encounter: Payer: Self-pay | Admitting: Cardiology

## 2021-01-29 VITALS — BP 136/75 | HR 61 | Temp 97.2°F | Resp 16 | Ht 71.0 in | Wt 200.8 lb

## 2021-01-29 DIAGNOSIS — I6523 Occlusion and stenosis of bilateral carotid arteries: Secondary | ICD-10-CM

## 2021-01-29 DIAGNOSIS — R0609 Other forms of dyspnea: Secondary | ICD-10-CM

## 2021-01-29 DIAGNOSIS — I2510 Atherosclerotic heart disease of native coronary artery without angina pectoris: Secondary | ICD-10-CM

## 2021-01-29 DIAGNOSIS — E782 Mixed hyperlipidemia: Secondary | ICD-10-CM

## 2021-01-29 DIAGNOSIS — Z87891 Personal history of nicotine dependence: Secondary | ICD-10-CM

## 2021-01-29 DIAGNOSIS — Z7901 Long term (current) use of anticoagulants: Secondary | ICD-10-CM

## 2021-01-29 DIAGNOSIS — Z951 Presence of aortocoronary bypass graft: Secondary | ICD-10-CM

## 2021-01-29 DIAGNOSIS — J84112 Idiopathic pulmonary fibrosis: Secondary | ICD-10-CM

## 2021-01-29 DIAGNOSIS — Z8719 Personal history of other diseases of the digestive system: Secondary | ICD-10-CM

## 2021-01-29 DIAGNOSIS — I700 Atherosclerosis of aorta: Secondary | ICD-10-CM

## 2021-01-29 DIAGNOSIS — I4819 Other persistent atrial fibrillation: Secondary | ICD-10-CM

## 2021-01-29 DIAGNOSIS — I10 Essential (primary) hypertension: Secondary | ICD-10-CM

## 2021-01-30 ENCOUNTER — Other Ambulatory Visit: Payer: Self-pay | Admitting: Cardiology

## 2021-01-30 DIAGNOSIS — Z7901 Long term (current) use of anticoagulants: Secondary | ICD-10-CM

## 2021-01-30 DIAGNOSIS — R0609 Other forms of dyspnea: Secondary | ICD-10-CM

## 2021-01-30 LAB — BASIC METABOLIC PANEL
BUN/Creatinine Ratio: 17 (ref 10–24)
BUN: 16 mg/dL (ref 8–27)
CO2: 26 mmol/L (ref 20–29)
Calcium: 9.2 mg/dL (ref 8.6–10.2)
Chloride: 91 mmol/L — ABNORMAL LOW (ref 96–106)
Creatinine, Ser: 0.95 mg/dL (ref 0.76–1.27)
Glucose: 95 mg/dL (ref 70–99)
Potassium: 4.3 mmol/L (ref 3.5–5.2)
Sodium: 130 mmol/L — ABNORMAL LOW (ref 134–144)
eGFR: 78 mL/min/{1.73_m2} (ref >59)

## 2021-01-30 LAB — BRAIN NATRIURETIC PEPTIDE: BNP: 162.7 pg/mL — ABNORMAL HIGH (ref 0.0–100.0)

## 2021-01-30 MED ORDER — LOSARTAN POTASSIUM 50 MG PO TABS: 50.0000 mg | ORAL_TABLET | Freq: Every day | ORAL | 0 refills | Status: DC

## 2021-01-30 MED ORDER — TORSEMIDE 5 MG PO TABS: 5.0000 mg | ORAL_TABLET | Freq: Every morning | ORAL | 0 refills | Status: DC

## 2021-01-30 MED ORDER — ROSUVASTATIN CALCIUM 20 MG PO TABS: 20.0000 mg | ORAL_TABLET | Freq: Every day | ORAL | Status: DC

## 2021-01-30 NOTE — Progress Notes (Signed)
I know you saw him yesterday

## 2021-01-30 NOTE — Progress Notes (Signed)
ID:  [name], DOB Jun 18, 1933, MRN [id number]  PCP:  Charlane Ferretti, DO  Cardiologist:  Tessa Lerner, DO, [facility] (established care 10/08/2020) Former Cardiology Providers: Dr. 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  He is referred to the office at the request of Charlane Ferretti, DO for evaluation of management of CAD and atrial fibrillation.  He is referred to the office for management of CAD and atrial fibrillation.  He is also requesting assistance with blood pressure management.  While living in [place] he was under the care of Dr. Hyacinth Meeker and recently moved to [facility] to be closer to grandkids.  Patient had a nuclear stress test with Dr. Hyacinth Meeker back in April 2022 and based on the report there is mild anterolateral hypokinesis with fixed inferior and lateral perfusion defect with some reversibility suggesting of peri-infarct ischemia overall mildly abnormal scan.  Patient denies any chest pain at rest or with effort related activities.  His shortness of breath is chronic and stable and more pronounced with effort related activities/overexertion. In the past we have discussed undergoing additional cardiovascular testing however the shared decision was to continue medical management given his age and being asymptomatic.  Since establishing care he was started on  isosorbide dinitrate and hydralazine which has helped to manage his blood pressures well.  At the last office visit the shared decision was to reduce hydrochlorothiazide to 12.5 mg p.o. daily and start carvedilol to help achieve better rate control given his underlying atrial fibrillation.  However, patient states that since reducing his hydrochlorothiazide he thinks he has gained weight (his weight is actually lower compared to his December visit), lower extremity swelling, more short of breath.  In addition, he is feeling more tired, fatigue, and easily falls asleep per his wife.  Patient's wife also states that he is restless at night.  Given the symptoms he went to urgent care over the holidays and his hydrochlorothiazide was increased back to his original dose of 25 mg p.o. daily.   Due to urgent care visits, him not feeling well, and repeat correspondence back-and-forth via telephone conversations he was asked to come in for sick visit to be reevaluated.  Patient's wife would like to hold off on going to the emergency room due to prolonged wait times.  Patient denies any active chest pain at rest or with effort related activities.  Since last office visit patient states that his dyspnea on exertion and lower extremity swelling are both a bit worse.  He denies orthopnea or paroxysmal nocturnal dyspnea.  He recently also is recovering from pneumonia.  Of note he has underlying idiopathic pulmonary fibrosis and he used to be a former smoker.   FUNCTIONAL STATUS: No structured exercise program or daily routine.   ALLERGIES: No Known Allergies  MEDICATION LIST PRIOR TO VISIT: Current Meds  Medication Sig   ezetimibe (ZETIA) 10 MG tablet Take  10 mg by mouth daily.   famotidine (PEPCID) 20 MG tablet Take 20 mg by mouth 2 (two) times daily.   fluticasone furoate-vilanterol (BREO ELLIPTA) 100-25 MCG/ACT AEPB Inhale 1 puff into the lungs daily.   hydrALAZINE (APRESOLINE) 25 MG tablet Take 1 tablet  (25 mg total) by mouth 3 (three) times daily.   hydrochlorothiazide (HYDRODIURIL) 25 MG tablet Take 1 tablet (25 mg total) by mouth daily.   isosorbide dinitrate (ISORDIL) 20 MG tablet Take 1 tablet (20 mg total) by mouth 3 (three) times daily.   lisinopril (ZESTRIL) 40 MG tablet TAKE 1 TABLET (40 MG TOTAL) BY MOUTH DAILY AT 10 PM.   naproxen (NAPROSYN) 375 MG tablet Take 375 mg by mouth 2 (two) times daily with a meal.   rivaroxaban (XARELTO) 20 MG TABS tablet Take 1 tablet (20 mg total) by mouth daily with supper.   rosuvastatin (CRESTOR) 40 MG tablet Take 1 tablet (40 mg total) by mouth at bedtime for 90 doses. (Patient taking differently: Take 20 mg by mouth at bedtime.)   tamsulosin (FLOMAX) 0.4 MG CAPS capsule Take 0.4 mg by mouth daily.   [DISCONTINUED] carvedilol (COREG) 3.125 MG tablet Take 1 tablet (3.125 mg total) by mouth 2 (two) times daily with a meal. Hold if systolic blood pressure (top number) less than 100 mmHg or pulse less than 55 bpm.     PAST MEDICAL HISTORY: Past Medical History:  Diagnosis Date   Atrial fibrillation ([place])    CAD (coronary artery disease)    CVA (cerebral vascular accident) ([place]) 05/27/2013   GI bleed 05/27/2013   HTN (hypertension)    Hx of CABG    Possibly 2015?   Hyperlipidemia     PAST SURGICAL HISTORY: Past Surgical History:  Procedure Laterality Date   CORONARY ARTERY BYPASS GRAFT  1992   ESOPHAGOGASTRODUODENOSCOPY     TONSILLECTOMY AND ADENOIDECTOMY      FAMILY HISTORY: The patient family history includes Breast cancer (age of onset: 35) in his mother; Heart attack (age of onset: 5) in his sister; Hypertension in his father; Vision loss in his mother.  SOCIAL HISTORY:  The patient  reports that he quit smoking about 54 years ago. His smoking use included cigarettes. He started smoking about 66 years ago. He has a 24.00 pack-year smoking history. He has never used smokeless tobacco. He reports that he does not currently use alcohol.  He reports that he does not use drugs.  REVIEW OF SYSTEMS: Review of Systems  Constitutional: Positive for malaise/fatigue. Negative for chills and fever.  HENT:  Negative for hoarse voice and nosebleeds.   Eyes:  Negative for discharge, double vision and pain.  Cardiovascular:  Positive for dyspnea on exertion and leg swelling. Negative for chest pain, claudication, near-syncope, orthopnea, palpitations, paroxysmal nocturnal dyspnea and syncope.  Respiratory:  Negative for hemoptysis and shortness of breath.   Musculoskeletal:  Negative for muscle cramps and myalgias.  Gastrointestinal:  Negative for abdominal pain, constipation, diarrhea, hematemesis, hematochezia, melena, nausea and vomiting.  Neurological:  Negative for dizziness and light-headedness.   PHYSICAL EXAM: Vitals with BMI 01/29/2021 01/27/2021 01/10/2021  Height $Remov'5\' 11"'yBXmCz$  - $'5\' 11"'h$   Weight 200 lbs 13 oz - 205 lbs  BMI 16.10 - 96.0  Systolic 454 098 119  Diastolic 75 74 75  Pulse 61 68 71   Orthostatic VS for the past 72 hrs (Last 3 readings):  Orthostatic BP Patient Position BP Location Cuff Size Orthostatic Pulse  01/29/21 1150 129/62 Standing Left  Arm Normal 67  01/29/21 1149 127/71 Sitting Left Arm Normal 56  01/29/21 1147 128/66 Supine Left Arm Normal 57    CONSTITUTIONAL: Well-developed and well-nourished. No acute distress.  SKIN: Skin is warm and dry. No rash noted. No cyanosis. No pallor. No jaundice HEAD: Normocephalic and atraumatic.  EYES: No scleral icterus MOUTH/THROAT: Moist oral membranes.  NECK: No JVD present. No thyromegaly noted. Right carotid bruits  LYMPHATIC: No visible cervical adenopathy.  CHEST Normal respiratory effort. No intercostal retractions.  Surgical scar is well-healed. LUNGS: Clear to auscultation bilaterally.  No stridor. No wheezes. No rales.  CARDIOVASCULAR: Irregularly irregular, variable R4-E3, soft holosystolic murmur heard at the apex, no gallops or rubs. ABDOMINAL: Soft,  nontender, nondistended, positive bowel sounds in all 4 quadrants, no apparent ascites.  EXTREMITIES: No peripheral edema, bilateral pitting edema left worse than the right, 2+ DP and PT pulses, chronic venous insufficiency.  So all these medications are going to [facility] HEMATOLOGIC: No significant bruising NEUROLOGIC: Oriented to person, place, and time. Nonfocal. Normal muscle tone.  PSYCHIATRIC: Normal mood and affect. Normal behavior. Cooperative  CARDIAC DATABASE: EKG: 10/08/2020 atrial fibrillation, 58 bpm, without underlying injury pattern. 01/29/2021: Atrial fibrillation, 49 bpm, nonspecific T wave abnormality.   Echocardiogram: 04/16/2020: LVEF 55-60%, inferior basal hypokinesis, mildly dilated LV cavity, mild LVH, RV size and function normal, severely dilated left atrium, mild MR, moderate TR, mild AI, mild to moderate aortic valve sclerosis without stenosis, ascending aorta 39 mm.   Stress Testing: Last MPI Jun April 2022 with Dr. Sabra Heck in [place],: Per report: Perfusion study is abnormal, fixed perfusion defect seen on imaging, mild reversibility. Poor exercise tolerance. No exercise-induced arrhythmia. Normal exercise blood pressure response. No exercise-induced ECG changes. Anterolateral infarct. Mild anterolateral hypokinesis LVEF calculated 55%. Mild anterolateral hypokinesis.  Fixed inferior lateral perfusion defect with some reversibility peri-infarct.  Mildly abnormal scan  Heart Catheterization: 5 to 6 years ago, per patient we will request records.  Carotid artery duplex 10/23/2020: Duplex suggests stenosis in the right internal carotid artery (16-49%).  Duplex suggests stenosis in the right external carotid artery (<50%). Duplex suggests stenosis in the left internal carotid artery (50-69%).  Duplex suggests stenosis in the left external carotid artery (<50%). Moderate heterogeneous plaque bilateral carotid arteries. Antegrade right vertebral artery flow.  Antegrade left vertebral artery flow. Follow up in six months is appropriate if clinically indicated.  LABORATORY DATA: CBC Latest Ref Rng & Units 12/31/2020  Hemoglobin 13.0 - 17.7 g/dL 12.0(L)  Hematocrit 37.5 - 51.0 % 35.9(L)    CMP Latest Ref Rng & Units 01/29/2021 12/31/2020  Glucose 70 - 99 mg/dL 95 86  BUN 8 - 27 mg/dL 16 20  Creatinine 0.76 - 1.27 mg/dL 0.95 1.06  Sodium 134 - 144 mmol/L 130(L) 135  Potassium 3.5 - 5.2 mmol/L 4.3 4.3  Chloride 96 - 106 mmol/L 91(L) 96  CO2 20 - 29 mmol/L 26 26  Calcium 8.6 - 10.2 mg/dL 9.2 9.2  Total Protein 6.0 - 8.5 g/dL - 6.7  Total Bilirubin 0.0 - 1.2 mg/dL - 0.4  Alkaline Phos 44 - 121 IU/L - 52  AST 0 - 40 IU/L - 27  ALT 0 - 44 IU/L - 17    Lipid Panel     Component Value Date/Time   CHOL 96 (L) 12/31/2020 0919   TRIG 83 12/31/2020 0919   HDL 41 12/31/2020 0919   LDLCALC 38 12/31/2020 0919   LDLDIRECT 38 12/31/2020 0922   LABVLDL 17 12/31/2020 0919  No components found for: NTPROBNP Recent Labs    10/17/20 0952  PROBNP 581*   No results for input(s): TSH in the last 8760 hours.  BMP Recent Labs    12/31/20 0920 01/29/21 0930  NA 135 130*  K 4.3 4.3  CL 96 91*  CO2 26 26  GLUCOSE 86 95  BUN 20 16  CREATININE 1.06 0.95  CALCIUM 9.2 9.2    HEMOGLOBIN A1C No results found for: HGBA1C, MPG  External Labs: Collected: 09/03/2020 provided by referring physician Creatinine 1.2 mg/dL. eGFR: 57 mL/min per 1.73 m Sodium 138, potassium 4.2, chloride 102, bicarb 28 AST 22, ALT 17 Hemoglobin 13.5 g/dL, hematocrit 38.4% Lipid profile: Total cholesterol 143, triglycerides 70, HDL 47, LDL 82, non-HDL 96.   Apolipoprotein B 58  TSH 2.11  IMPRESSION:    ICD-10-CM   1. Dyspnea on exertion  R06.09 PCV ECHOCARDIOGRAM COMPLETE    2. Coronary artery disease involving native coronary artery of native heart without angina pectoris  I25.10 EKG 12-Lead    3. Hx of CABG  Z95.1     4. Persistent atrial fibrillation (HCC)   I48.19     5. Long term (current) use of anticoagulants  Z79.01     6. History of GI bleed  Z87.19     7. Benign hypertension  I10     8. Bilateral carotid artery stenosis  I65.23     9. Mixed hyperlipidemia  E78.2     10. Atherosclerosis of aorta (HCC)  I70.0     11. Former smoker  Z87.891     39. IPF (idiopathic pulmonary fibrosis) (HCC)  J84.112         RECOMMENDATIONS: Elery Cadenhead is a 88 y.o. male whose past medical history and cardiac risk factors include: Hypertension,, hyperlipidemia, CVA in 2015, atrial fibrillation, CAD status post CABG, history of GI bleed secondary to AVM in 2015, aortic atherosclerosis, former smoker, idiopathic pulmonary fibrosis, advanced age.  Dyspnea on exertion Multifactorial: Idiopathic pulmonary fibrosis, known history of CAD with a history of CABG, underlying atrial fibrillation, former smoker, elevated blood pressures, recent pneumonia. Labs from 01/29/2021 were pending at the time of the office visit. Echocardiogram will be ordered to evaluate for structural heart disease and left ventricular systolic function.   Recommend discontinuation of carvedilol, will recommend medication changes if needed based on lab work and echocardiographic findings.  However, if symptoms continue despite such interventions recommend repeating stress test to evaluate for reversible ischemia given his complex cardiovascular history. EKG nonischemic. Orthostatic vital signs negative. Atrial fibrillation is rate controlled. Idiopathic pulmonary fibrosis management per pulmonary medicine  Coronary artery disease involving the native coronary arteries, history of CABG: Echo 03/2020: LVEF 55-60%, with inferior basal hypokinesis, additional details noted above.   MPI performed at outside facility in April 2022 notes mild reversible perfusion defect suggestive of peri-infarct ischemia.   In the past patient wanted to continue medical therapy and has refused heart  catheterization with both Dr. Sabra Heck and myself. Plan echocardiogram as discussed above. Either reduce change in LVEF or if his symptoms continue despite interventions mentioned above repeat ischemic evaluation would be appropriate.  However, in the interim if he has new onset of chest pain or his current symptoms increase in intensity, frequency, and/or duration patient's wife is educated on seeking medical attention by going to the closest ER via EMS for further evaluation and management. Continue Crestor and Zetia. Currently not on aspirin given history of GI bleeds and AVM as he  is on Xarelto for thromboembolic prophylaxis  Atrial fibrillation, persistent Rate control: Discontinue Coreg 3.125 mg p.o. twice daily given his symptoms of bradycardia Rhythm control: N/A. Thromboembolic prophylaxis: Xarelto CHA2DS2-VASc SCORE is 6 which correlates to 9.6% risk of stroke per year, ( Age, HTN, hx stroke, aortic atherosclerosis).   Long term (current) use of anticoagulants /history of GI bleed: Currently on Xarelto 20 mg p.o. q. supper. Does not endorse evidence of bleeding. History of GI bleeding back in 2015 due to AVMs. In the past have recommended him to be considered for watchman device given his elevated CHA2DS2-VASc score and history of GI bleed.  However patient and wife wanted to hold off.    Bilateral carotid artery stenosis: Carotid Duplex scheudled for April 2023 Currently on Crestor $RemoveBe'20mg'eyzXBvHJQ$  po qhs and Zeita.  Recent LDL $RemoveBe'38mg'RYINRqeJF$ /dL.   Mixed hyperlipidemia Outside labs independently reviewed. LDL currently at goal.  Benign hypertension Patient is requesting assistance with blood pressure management. Recently home blood pressures are not well controlled based on the blood pressure log that the patient provides. Patient had labs earlier this morning which were pending at the time of this visit. Discontinue carvedilol Further recommendations to follow.  IPF (idiopathic pulmonary  fibrosis) ([place]) Recommended that he follows up with pulmonary medicine provider regularly as idiopathic pulmonary fibrosis can sometimes lead to pulmonary hypertension.  Most recent echocardiogram notes normal RVSP.   FINAL MEDICATION LIST END OF ENCOUNTER: No orders of the defined types were placed in this encounter.   Medications Discontinued During This Encounter  Medication Reason   carvedilol (COREG) 3.125 MG tablet Discontinued by provider     Current Outpatient Medications:    ezetimibe (ZETIA) 10 MG tablet, Take 10 mg by mouth daily., Disp: , Rfl:    famotidine (PEPCID) 20 MG tablet, Take 20 mg by mouth 2 (two) times daily., Disp: , Rfl:    fluticasone furoate-vilanterol (BREO ELLIPTA) 100-25 MCG/ACT AEPB, Inhale 1 puff into the lungs daily., Disp: 60 each, Rfl: 2   hydrALAZINE (APRESOLINE) 25 MG tablet, Take 1 tablet (25 mg total) by mouth 3 (three) times daily., Disp: 90 tablet, Rfl: 2   hydrochlorothiazide (HYDRODIURIL) 25 MG tablet, Take 1 tablet (25 mg total) by mouth daily., Disp: 30 tablet, Rfl: 0   isosorbide dinitrate (ISORDIL) 20 MG tablet, Take 1 tablet (20 mg total) by mouth 3 (three) times daily., Disp: 90 tablet, Rfl: 2   lisinopril (ZESTRIL) 40 MG tablet, TAKE 1 TABLET (40 MG TOTAL) BY MOUTH DAILY AT 10 PM., Disp: 90 tablet, Rfl: 0   naproxen (NAPROSYN) 375 MG tablet, Take 375 mg by mouth 2 (two) times daily with a meal., Disp: , Rfl:    rivaroxaban (XARELTO) 20 MG TABS tablet, Take 1 tablet (20 mg total) by mouth daily with supper., Disp: 90 tablet, Rfl: 0   rosuvastatin (CRESTOR) 40 MG tablet, Take 1 tablet (40 mg total) by mouth at bedtime for 90 doses. (Patient taking differently: Take 20 mg by mouth at bedtime.), Disp: 90 tablet, Rfl: 0   tamsulosin (FLOMAX) 0.4 MG CAPS capsule, Take 0.4 mg by mouth daily., Disp: , Rfl:   Orders Placed This Encounter  Procedures   EKG 12-Lead   PCV ECHOCARDIOGRAM COMPLETE   There are no Patient Instructions on file for this  visit.   --Continue cardiac medications as reconciled in final medication list. --Return in about 2 weeks (around 02/12/2021) for Follow up, Dyspnea. Or sooner if needed. --Continue follow-up with your primary care physician regarding  the management of your other chronic comorbid conditions.  Patient's questions and concerns were addressed to his satisfaction. He voices understanding of the instructions provided during this encounter.   This note was created using a voice recognition software as a result there may be grammatical errors inadvertently enclosed that do not reflect the nature of this encounter. Every attempt is made to correct such errors.  Mechele Claude [facility]  Pager: [phone] Office: [phone]  ADDENDUM: Prior to the completion of the office note patient's echocardiogram results and labs were available as of 01/30/2021. Spoke to both the patient and his wife over the phone.   He remains relatively stable compared to yesterday according to his wife.  PCV ECHOCARDIOGRAM COMPLETE 01/29/2021  Narrative Echocardiogram 01/29/2021: Left ventricle cavity is normal in size. Normal left ventricular wall thickness. Normal global wall motion. Normal LV systolic function with visual EF 55%. Unable to evaluate diastolic function due to atrial fibrillation. Left atrial cavity is severely dilated. Structurally normal trileaflet aortic valve. No evidence of aortic stenosis. Moderate (Grade II) aortic regurgitation. Mild (Grade I) mitral regurgitation. Moderate tricuspid regurgitation. Estimated pulmonary artery systolic pressure 50 mmHg. Mild to moderate pulmonic regurgitation.   Lab Results  Component Value Date   NA 130 (L) 01/29/2021   K 4.3 01/29/2021   CO2 26 01/29/2021   GLUCOSE 95 01/29/2021   BUN 16 01/29/2021   CREATININE 0.95 01/29/2021   CALCIUM 9.2 01/29/2021   EGFR 78 01/29/2021   BNP 162.7  Personally reviewed the echocardiogram performed on 01/29/2021.   Patient's LVEF is preserved.  Diastolic dysfunction is difficult to obtain due to underlying atrial fibrillation.  IVC is dilated and noncompressible suggestive of RAP to be approximately 15 mmHg.  In addition, MR and TR at least moderate resulting in PASP of approximately 50 mmHg.  Recommendation:  Patient and his wife were informed that the safest way to take care of his symptoms was to go to the ED.  However they would like to manage this as outpatient as much as possible.  Patient and his wife are educated to seek medical attention if he has new onset of chest pain, worsening shortness of breath, orthopnea, paroxysmal nocturnal dyspnea or lower extremity swelling.  They are also informed that he has low sodium levels and therefore if he has symptoms of increased somnolence, altered mental status, or instability or change in oral wellbeing that he should go to the ED for further evaluation and management.  For now would recommend Discontinuation of lisinopril 40 mg p.o. daily. And will start losartan 50 mg p.o. daily Discontinue hydrochlorothiazide and start torsemide 5 mg p.o. every morning. Blood work on 02/04/2021 to reevaluate BMP, magnesium, NT proBNP.  Total time spent: 60 minutes at least given the change in clinical status prompting a sick visit.  Obtaining an echocardiogram ordered after today's office visit, lab work.  Following up on 01/30/2021 to reevaluate patient, discuss results of the echo and lab work,  medication changes as discussed above, and spoke to his wife to update her plans of care.  Mechele Claude [facility]  Pager: [phone] Office: [phone] 01/30/21 6:17 PM

## 2021-02-01 NOTE — Telephone Encounter (Signed)
From pt

## 2021-02-04 ENCOUNTER — Telehealth: Payer: Self-pay | Admitting: Cardiology

## 2021-02-04 NOTE — Telephone Encounter (Signed)
Patient's son Dr. Michaell Cowing reached out via epic chat and I called him today as well.   The call would just hang up; unable to leave a voicemail. Tried calling twice.   Tessa Lerner, [place], [facility]  Pager: [phone] Office: [phone]

## 2021-02-05 LAB — HEMOGLOBIN AND HEMATOCRIT, BLOOD
Hematocrit: 38.1 % (ref 37.5–51.0)
Hemoglobin: 12.9 g/dL — ABNORMAL LOW (ref 13.0–17.7)

## 2021-02-05 LAB — BASIC METABOLIC PANEL
BUN/Creatinine Ratio: 19 (ref 10–24)
BUN: 18 mg/dL (ref 8–27)
CO2: 25 mmol/L (ref 20–29)
Calcium: 9.4 mg/dL (ref 8.6–10.2)
Chloride: 100 mmol/L (ref 96–106)
Creatinine, Ser: 0.95 mg/dL (ref 0.76–1.27)
Glucose: 94 mg/dL (ref 70–99)
Potassium: 4.7 mmol/L (ref 3.5–5.2)
Sodium: 139 mmol/L (ref 134–144)
eGFR: 78 mL/min/{1.73_m2} (ref >59)

## 2021-02-05 LAB — PRO B NATRIURETIC PEPTIDE: NT-Pro BNP: 1077 pg/mL — ABNORMAL HIGH (ref 0–486)

## 2021-02-05 LAB — MAGNESIUM: Magnesium: 1.9 mg/dL (ref 1.6–2.3)

## 2021-02-08 ENCOUNTER — Other Ambulatory Visit: Payer: Self-pay | Admitting: Cardiology

## 2021-02-08 DIAGNOSIS — R0609 Other forms of dyspnea: Secondary | ICD-10-CM

## 2021-02-08 DIAGNOSIS — I5031 Acute diastolic (congestive) heart failure: Secondary | ICD-10-CM

## 2021-02-08 MED ORDER — ENTRESTO 49-51 MG PO TABS: 1.0000 | ORAL_TABLET | Freq: Two times a day (BID) | ORAL | 0 refills | Status: DC

## 2021-02-08 NOTE — Progress Notes (Signed)
TELEPHONE ENCOUNTER CARDIOLOGY 02/08/21  Patient's name: Calvin Brown.   MRN: [id number].    DOB: 1934/01/10 Primary care provider: Sueanne Margarita, DO. Primary cardiologist: Rex Kras, [facility]  Interaction regarding this patient's care today: As per patient and wife wishes we are managing his shortness of breath and lower extremity swelling as outpatient.  At the last encounter hydrochlorothiazide was discontinued and transitioned to torsemide.  He had blood work done which was reviewed with his wife and himself over the phone today.  Sodium levels have improved.  NT proBNP continues to be elevated.  Clinically, lower extremity swelling has improved secondary to wearing compression stockings.  His blood pressure is higher in the evening hours compared to the morning.  And his heart rate has improved after stopping carvedilol.  Overall the patient's wife states that he is doing well but not back to baseline.  LABORATORY DATA: CBC Latest Ref Rng & Units 02/04/2021 12/31/2020  Hemoglobin 13.0 - 17.7 g/dL 12.9(L) 12.0(L)  Hematocrit 37.5 - 51.0 % 38.1 35.9(L)    CMP Latest Ref Rng & Units 02/04/2021 01/29/2021 12/31/2020  Glucose 70 - 99 mg/dL 94 95 86  BUN 8 - 27 mg/dL 18 16 20   Creatinine 0.76 - 1.27 mg/dL 0.95 0.95 1.06  Sodium 134 - 144 mmol/L 139 130(L) 135  Potassium 3.5 - 5.2 mmol/L 4.7 4.3 4.3  Chloride 96 - 106 mmol/L 100 91(L) 96  CO2 20 - 29 mmol/L 25 26 26   Calcium 8.6 - 10.2 mg/dL 9.4 9.2 9.2  Total Protein 6.0 - 8.5 g/dL - - 6.7  Total Bilirubin 0.0 - 1.2 mg/dL - - 0.4  Alkaline Phos 44 - 121 IU/L - - 52  AST 0 - 40 IU/L - - 27  ALT 0 - 44 IU/L - - 17    Lipid Panel     Component Value Date/Time   CHOL 96 (L) 12/31/2020 0919   TRIG 83 12/31/2020 0919   HDL 41 12/31/2020 0919   LDLCALC 38 12/31/2020 0919   LDLDIRECT 38 12/31/2020 0922   LABVLDL 17 12/31/2020 0919    No components found for: NTPROBNP Recent Labs    10/17/20 0952 02/04/21 0939  PROBNP 581*  1,077*   No results for input(s): TSH in the last 8760 hours.  BMP Recent Labs    12/31/20 0920 01/29/21 0930 02/04/21 0939  NA 135 130* 139  K 4.3 4.3 4.7  CL 96 91* 100  CO2 26 26 25   GLUCOSE 86 95 94  BUN 20 16 18   CREATININE 1.06 0.95 0.95  CALCIUM 9.2 9.2 9.4    HEMOGLOBIN A1C No results found for: HGBA1C, MPG  Cardiac Panel (last 3 results) No results for input(s): CKTOTAL, CKMB, TROPONINIHS, RELINDX in the last 72 hours.  CHOLESTEROL Recent Labs    12/31/20 0919  CHOL 96*    Hepatic Function Panel Recent Labs    12/31/20 0920  PROT 6.7  ALBUMIN 4.1  AST 27  ALT 17  ALKPHOS 52  BILITOT 0.4   Impression:   ICD-10-CM   1. Dyspnea on exertion  R06.09 sacubitril-valsartan (ENTRESTO) 49-51 MG    Pro b natriuretic peptide (BNP)    Basic metabolic panel    Magnesium    2. Acute heart failure with preserved ejection fraction (HFpEF) (HCC)  I50.31 sacubitril-valsartan (ENTRESTO) 49-51 MG    Pro b natriuretic peptide (BNP)    Basic metabolic panel    Magnesium     Medications Discontinued During This Encounter  Medication Reason   losartan (COZAAR) 50 MG tablet Change in therapy    Meds ordered this encounter  Medications   sacubitril-valsartan (ENTRESTO) 49-51 MG    Sig: Take 1 tablet by mouth 2 (two) times daily.    Dispense:  60 tablet    Refill:  0    Orders Placed This Encounter  Procedures   Pro b natriuretic peptide (BNP)   Basic metabolic panel   Magnesium    Recommendations: Independently reviewed the echocardiogram.  Based on filling pressures patient does have diastolic dysfunction which contributes to his underlying HFpEF.  Given the symptoms and elevated NT proBNP we will transition him from losartan to Entresto 49/51 mg p.o. twice daily.  Blood work in 1 week to evaluate kidney function and electrolytes.  Plan of care discussed with Dr. Johney Maine and his wife over the phone.  I also informed them that I did reach out to his son  twice but had to leave a voicemail.  I have been informed that he is out of country and will reach back to me if needed.  Telephone encounter total time: 15 minutes  [facility]  Pager: [phone] Office: [phone]

## 2021-02-14 ENCOUNTER — Ambulatory Visit: Payer: Medicare Other | Admitting: Cardiology

## 2021-02-19 LAB — BASIC METABOLIC PANEL
BUN/Creatinine Ratio: 21 (ref 10–24)
BUN: 20 mg/dL (ref 8–27)
CO2: 24 mmol/L (ref 20–29)
Calcium: 9.7 mg/dL (ref 8.6–10.2)
Chloride: 105 mmol/L (ref 96–106)
Creatinine, Ser: 0.96 mg/dL (ref 0.76–1.27)
Glucose: 96 mg/dL (ref 70–99)
Potassium: 4.4 mmol/L (ref 3.5–5.2)
Sodium: 143 mmol/L (ref 134–144)
eGFR: 77 mL/min/{1.73_m2} (ref >59)

## 2021-02-19 LAB — MAGNESIUM: Magnesium: 1.8 mg/dL (ref 1.6–2.3)

## 2021-02-19 LAB — PRO B NATRIURETIC PEPTIDE: NT-Pro BNP: 600 pg/mL — ABNORMAL HIGH (ref 0–486)

## 2021-02-20 ENCOUNTER — Other Ambulatory Visit: Payer: Self-pay | Admitting: Cardiology

## 2021-02-20 DIAGNOSIS — I2510 Atherosclerotic heart disease of native coronary artery without angina pectoris: Secondary | ICD-10-CM

## 2021-02-20 DIAGNOSIS — I10 Essential (primary) hypertension: Secondary | ICD-10-CM

## 2021-02-20 DIAGNOSIS — Z951 Presence of aortocoronary bypass graft: Secondary | ICD-10-CM

## 2021-02-20 DIAGNOSIS — R0609 Other forms of dyspnea: Secondary | ICD-10-CM

## 2021-02-20 NOTE — Progress Notes (Signed)
Spoke to patient's wife she is aware and she said they are good on the [place] for now

## 2021-02-21 ENCOUNTER — Other Ambulatory Visit: Payer: Self-pay | Admitting: Cardiology

## 2021-02-21 DIAGNOSIS — R0609 Other forms of dyspnea: Secondary | ICD-10-CM

## 2021-02-26 ENCOUNTER — Other Ambulatory Visit: Payer: Self-pay

## 2021-02-26 ENCOUNTER — Ambulatory Visit: Payer: Medicare Other | Admitting: Cardiology

## 2021-02-26 ENCOUNTER — Encounter: Payer: Self-pay | Admitting: Cardiology

## 2021-02-26 VITALS — BP 146/74 | HR 68 | Temp 97.6°F | Resp 16 | Ht 71.0 in | Wt 196.6 lb

## 2021-02-26 DIAGNOSIS — I5032 Chronic diastolic (congestive) heart failure: Secondary | ICD-10-CM

## 2021-02-26 DIAGNOSIS — Z7901 Long term (current) use of anticoagulants: Secondary | ICD-10-CM

## 2021-02-26 DIAGNOSIS — R0609 Other forms of dyspnea: Secondary | ICD-10-CM

## 2021-02-26 DIAGNOSIS — I700 Atherosclerosis of aorta: Secondary | ICD-10-CM

## 2021-02-26 DIAGNOSIS — E782 Mixed hyperlipidemia: Secondary | ICD-10-CM

## 2021-02-26 DIAGNOSIS — Z951 Presence of aortocoronary bypass graft: Secondary | ICD-10-CM

## 2021-02-26 DIAGNOSIS — I6523 Occlusion and stenosis of bilateral carotid arteries: Secondary | ICD-10-CM

## 2021-02-26 DIAGNOSIS — I4819 Other persistent atrial fibrillation: Secondary | ICD-10-CM

## 2021-02-26 DIAGNOSIS — Z8719 Personal history of other diseases of the digestive system: Secondary | ICD-10-CM

## 2021-02-26 DIAGNOSIS — I10 Essential (primary) hypertension: Secondary | ICD-10-CM

## 2021-02-26 DIAGNOSIS — I2510 Atherosclerotic heart disease of native coronary artery without angina pectoris: Secondary | ICD-10-CM

## 2021-02-26 NOTE — Progress Notes (Signed)
ID:  Calvin Brown, DOB 02-07-1933, MRN [id number]  PCP:  Sueanne Margarita, DO  Cardiologist:  Rex Kras, DO, [facility] (established care 10/08/2020) Former Cardiology Providers: Dr. 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 fib, HFpEF" Patient's past medical history and cardiovascular risk factors include: Hypertension, hyperlipidemia, CVA in 2015, persistent atrial fibrillation, CAD status post CABG in 1992 (LIMA to the LAD and SVG to OM) at age of 34, HFpEF, history of GI bleed secondary to AVM in 2015, aortic atherosclerosis, former smoker, idiopathic pulmonary fibrosis, advanced age.  Referred to the practice by PCP for evaluation and management of CAD and atrial fibrillation.  Patient and wife have also requested assistance with blood pressure management.  He recently moved from [place] to [place] to be closer to family and grandkids.  While he was in [place] he was under the care of Dr. Sabra Heck.  Based on the records obtained he had a nuclear stress test in April 2022 with Dr. Sabra Heck which noted mild anterolateral hypokinesis with fixed inferior and lateral perfusion defects with some reversibility suggesting peri-infarct ischemia overall mildly abnormal stress test.  At that time patient refused invasive angiography and recommended to continue medical management.  Patient's medications have been uptitrated and blood pressures have improved.  However, he presented to the office in January 2023 as a sick visit due to increased tired, fatigue, shortness of breath at rest and with effort related activities, lower extremity swelling.  Lab work noted elevated NT proBNP and echocardiogram suggestive of elevated filling pressures concerning for HFpEF.  Since last visit he  was started on Entresto which he has tolerated well NT proBNP has improved based on the most recent labs from 02/20/2021.  Lower extremity swelling has improved markedly but still present and he denies orthopnea or paroxysmal nocturnal dyspnea.  Patient continues to feel tired, fatigued, and at times short of breath.  Had a long discussion with the patient and his wife that he has multiple comorbid conditions contributing to his symptoms which include but not limited to persistent atrial fibrillation, underlying idiopathic pulmonary fibrosis, HFpEF, CAD.  Compared to last office visit both patient and wife are agreeable that he has more energy, is less short of breath, leg swelling has improved.  Blood pressure log reviewed -numbers tend to be lower in the morning as compared to the evening hours.  Medications reconciled.  FUNCTIONAL STATUS: No structured exercise program or daily routine.   ALLERGIES: No Known Allergies  MEDICATION LIST PRIOR TO VISIT: Current Meds  Medication Sig   ezetimibe (ZETIA) 10 MG tablet Take 10 mg by mouth daily.   famotidine (PEPCID) 20 MG tablet Take 20 mg by mouth 2 (two) times daily.   fluticasone furoate-vilanterol (BREO ELLIPTA) 100-25 MCG/ACT AEPB Inhale 1 puff into the lungs daily.   hydrALAZINE (APRESOLINE) 25 MG tablet TAKE 1 TABLET BY MOUTH THREE TIMES A DAY   isosorbide dinitrate (ISORDIL) 20 MG tablet TAKE 1 TABLET BY MOUTH THREE TIMES A DAY   naproxen (NAPROSYN) 375 MG tablet Take 375 mg by mouth 2 (two) times daily with a meal.   rivaroxaban (XARELTO) 20 MG TABS tablet Take 1 tablet (20 mg total) by mouth daily with supper.   rosuvastatin (CRESTOR) 20  MG tablet Take 1 tablet (20 mg total) by mouth at bedtime for 90 doses.   sacubitril-valsartan (ENTRESTO) 49-51 MG Take 1 tablet by mouth 2 (two) times daily.   tamsulosin (FLOMAX) 0.4 MG CAPS capsule Take 0.4 mg by mouth daily.   torsemide (DEMADEX) 5 MG tablet TAKE 1 TABLET BY MOUTH EVERY DAY IN THE  MORNING     PAST MEDICAL HISTORY: Past Medical History:  Diagnosis Date   Atrial fibrillation ([place])    CAD (coronary artery disease)    CVA (cerebral vascular accident) ([place]) 05/27/2013   GI bleed 05/27/2013   HTN (hypertension)    Hx of CABG    Possibly 2015?   Hyperlipidemia     PAST SURGICAL HISTORY: Past Surgical History:  Procedure Laterality Date   CORONARY ARTERY BYPASS GRAFT  1992   ESOPHAGOGASTRODUODENOSCOPY     TONSILLECTOMY AND ADENOIDECTOMY      FAMILY HISTORY: The patient family history includes Breast cancer (age of onset: 62) in his mother; Heart attack (age of onset: 4) in his sister; Hypertension in his father; Vision loss in his mother.  SOCIAL HISTORY:  The patient  reports that he quit smoking about 54 years ago. His smoking use included cigarettes. He started smoking about 66 years ago. He has a 24.00 pack-year smoking history. He has never used smokeless tobacco. He reports that he does not currently use alcohol. He reports that he does not use drugs.  REVIEW OF SYSTEMS: Review of Systems  Constitutional: Positive for malaise/fatigue. Negative for chills and fever.  HENT:  Negative for hoarse voice and nosebleeds.   Eyes:  Negative for discharge, double vision and pain.  Cardiovascular:  Positive for dyspnea on exertion and leg swelling. Negative for chest pain, claudication, near-syncope, orthopnea, palpitations, paroxysmal nocturnal dyspnea and syncope.  Respiratory:  Negative for hemoptysis and shortness of breath.   Musculoskeletal:  Negative for muscle cramps and myalgias.  Gastrointestinal:  Negative for abdominal pain, constipation, diarrhea, hematemesis, hematochezia, melena, nausea and vomiting.  Neurological:  Negative for dizziness and light-headedness.   PHYSICAL EXAM: Vitals with BMI 02/26/2021 01/29/2021 01/27/2021  Height $Remov'5\' 11"'RkZXGD$  $RemoveB'5\' 11"'BanwgJZG$  -  Weight 196 lbs 10 oz 200 lbs 13 oz -  BMI 50.53 97.67 -  Systolic 341 937 902  Diastolic 74 75 74   Pulse 68 61 68    CONSTITUTIONAL: Well-developed and well-nourished. No acute distress.  SKIN: Skin is warm and dry. No rash noted. No cyanosis. No pallor. No jaundice HEAD: Normocephalic and atraumatic.  EYES: No scleral icterus MOUTH/THROAT: Moist oral membranes.  NECK: No JVD present. No thyromegaly noted. Right carotid bruits  LYMPHATIC: No visible cervical adenopathy.  CHEST Normal respiratory effort. No intercostal retractions.  Surgical scar is well-healed. LUNGS: Clear to auscultation bilaterally.  No stridor. No wheezes. No rales.  CARDIOVASCULAR: Irregularly irregular, variable I0-X7, soft holosystolic murmur heard at the apex, no gallops or rubs. ABDOMINAL: Soft, nontender, nondistended, positive bowel sounds in all 4 quadrants, no apparent ascites.  EXTREMITIES: +1 bilateral pitting edema, 2+ DP and PT pulses, chronic venous insufficiency HEMATOLOGIC: No significant bruising NEUROLOGIC: Oriented to person, place, and time. Nonfocal. Normal muscle tone.  PSYCHIATRIC: Normal mood and affect. Normal behavior. Cooperative  CARDIAC DATABASE: EKG: 10/08/2020 atrial fibrillation, 58 bpm, without underlying injury pattern. 01/29/2021: Atrial fibrillation, 49 bpm, nonspecific T wave abnormality.   Echocardiogram: 04/16/2020: LVEF 55-60%, inferior basal hypokinesis, mildly dilated LV cavity, mild LVH, RV size and function normal, severely dilated left atrium, mild MR, moderate  TR, mild AI, mild to moderate aortic valve sclerosis without stenosis, ascending aorta 39 mm.  01/29/2021:  Left ventricle cavity is normal in size. Normal left ventricular wall thickness. Normal global wall motion. Normal LV systolic function with visual EF 55%. Unable to evaluate diastolic function due to atrial fibrillation.  Left atrial cavity is severely dilated.  Structurally normal trileaflet aortic valve. No evidence of aortic stenosis. Moderate (Grade II) aortic regurgitation.  Mild (Grade I) mitral  regurgitation.  Moderate tricuspid regurgitation. Estimated pulmonary artery systolic pressure 50 mmHg.  Mild to moderate pulmonic regurgitation.   Stress Testing: Last MPI Jun April 2022 with Dr. Sabra Heck in [place],: Per report: Perfusion study is abnormal, fixed perfusion defect seen on imaging, mild reversibility. Poor exercise tolerance. No exercise-induced arrhythmia. Normal exercise blood pressure response. No exercise-induced ECG changes. Anterolateral infarct. Mild anterolateral hypokinesis LVEF calculated 55%. Mild anterolateral hypokinesis.  Fixed inferior lateral perfusion defect with some reversibility peri-infarct.  Mildly abnormal scan  Heart Catheterization: 5 to 6 years ago, per patient we will request records.  Carotid artery duplex 10/23/2020: Duplex suggests stenosis in the right internal carotid artery (16-49%).  Duplex suggests stenosis in the right external carotid artery (<50%). Duplex suggests stenosis in the left internal carotid artery (50-69%).  Duplex suggests stenosis in the left external carotid artery (<50%). Moderate heterogeneous plaque bilateral carotid arteries. Antegrade right vertebral artery flow. Antegrade left vertebral artery flow. Follow up in six months is appropriate if clinically indicated.  LABORATORY DATA: CBC Latest Ref Rng & Units 02/04/2021 12/31/2020  Hemoglobin 13.0 - 17.7 g/dL 12.9(L) 12.0(L)  Hematocrit 37.5 - 51.0 % 38.1 35.9(L)    CMP Latest Ref Rng & Units 02/18/2021 02/04/2021 01/29/2021  Glucose 70 - 99 mg/dL 96 94 95  BUN 8 - 27 mg/dL $Remove'20 18 16  'vsnGJHS$ Creatinine 0.76 - 1.27 mg/dL 0.96 0.95 0.95  Sodium 134 - 144 mmol/L 143 139 130(L)  Potassium 3.5 - 5.2 mmol/L 4.4 4.7 4.3  Chloride 96 - 106 mmol/L 105 100 91(L)  CO2 20 - 29 mmol/L $RemoveB'24 25 26  'KHYrxTIr$ Calcium 8.6 - 10.2 mg/dL 9.7 9.4 9.2  Total Protein 6.0 - 8.5 g/dL - - -  Total Bilirubin 0.0 - 1.2 mg/dL - - -  Alkaline Phos 44 - 121 IU/L - - -  AST 0 - 40 IU/L - - -  ALT 0  - 44 IU/L - - -    Lipid Panel     Component Value Date/Time   CHOL 96 (L) 12/31/2020 0919   TRIG 83 12/31/2020 0919   HDL 41 12/31/2020 0919   LDLCALC 38 12/31/2020 0919   LDLDIRECT 38 12/31/2020 0922   LABVLDL 17 12/31/2020 0919    No components found for: NTPROBNP Recent Labs    10/17/20 0952 02/04/21 0939 02/18/21 0920  PROBNP 581* 1,077* 600*   No results for input(s): TSH in the last 8760 hours.  BMP Recent Labs    01/29/21 0930 02/04/21 0939 02/18/21 0920  NA 130* 139 143  K 4.3 4.7 4.4  CL 91* 100 105  CO2 $Re'26 25 24  'bwI$ GLUCOSE 95 94 96  BUN $Re'16 18 20  'wsU$ CREATININE 0.95 0.95 0.96  CALCIUM 9.2 9.4 9.7    HEMOGLOBIN A1C No results found for: HGBA1C, MPG  External Labs: Collected: 09/03/2020 provided by referring physician Creatinine 1.2 mg/dL. eGFR: 57 mL/min per 1.73 m Sodium 138, potassium 4.2, chloride 102, bicarb 28 AST 22, ALT 17 Hemoglobin 13.5 g/dL, hematocrit 38.4% Lipid profile:  Total cholesterol 143, triglycerides 70, HDL 47, LDL 82, non-HDL 96.   Apolipoprotein B 58  TSH 2.11  IMPRESSION:    ICD-10-CM   1. Chronic heart failure with preserved ejection fraction (HCC)  I50.32     2. Dyspnea on exertion  R06.09     3. Persistent atrial fibrillation (HCC)  I48.19     4. Long term (current) use of anticoagulants  Z79.01     5. Coronary artery disease involving native coronary artery of native heart without angina pectoris  I25.10     6. Hx of CABG  Z95.1     7. Bilateral carotid artery stenosis  I65.23     8. Mixed hyperlipidemia  E78.2     9. Atherosclerosis of aorta (HCC)  I70.0     10. Benign hypertension  I10     11. History of GI bleed  Z87.19         RECOMMENDATIONS: Coty Larsh is a 88 y.o. male whose past medical history and cardiac risk factors include: Hypertension, hyperlipidemia, CVA in 2015, persistent atrial fibrillation, CAD status post CABG in 1992 (LIMA to the LAD and SVG to OM) at age of 55, HFpEF, history of GI  bleed secondary to AVM in 2015, aortic atherosclerosis, former smoker, idiopathic pulmonary fibrosis, advanced age.  Chronic heart failure with preserved ejection fraction (HCC) Diagnosis based on clinical presentation of worsening shortness of breath, lower extremity swelling, dyspnea on exertion elevated NT proBNP, elevated filling pressures on echocardiogram. Started on Entresto 49/51 mg p.o. twice daily Symptoms have improved markedly and NT proBNP downtrending Medications reconciled Will enroll the patient into principal care management for longitudinal follow-up and up titration of GDMT Most recent labs from 02/20/2021 independently reviewed with the patient and his wife at today's visit.  Dyspnea on exertion Multifactorial: HFpEF, idiopathic pulmonary fibrosis, known history of CAD with a history of CABG, underlying atrial fibrillation, former smoker, hypertension, recent pneumonia. Overall improving compared to last visit Echo results reviewed with the patient and his wife at today's visit. Discussed undergoing left and right heart catheterization to reevaluate for obstructive CAD and hemodynamics to evaluate for pulmonary hypertension in the setting of elevated RVSP and diagnosis of idiopathic pulmonary fibrosis.  However, currently both patient and wife would like to continue with medical management. Atrial fibrillation is rate controlled. Idiopathic pulmonary fibrosis management per pulmonary medicine  Persistent atrial fibrillation (HCC) Rate control: Ventricular rate is well controlled, did not tolerated carvedilol in the past. Rhythm control: N/A Thromboembolic prophylaxis: Xarelto CHA2DS2-VASc SCORE is 6 which correlates to 9.6% risk of stroke per year, ( Age, HTN, hx stroke, aortic atherosclerosis).   Long term (current) use of anticoagulants Indication: Persistent atrial fibrillation. Currently on Xarelto. Does not endorse evidence of bleeding. Reemphasized the risks,  benefits, and alternatives to oral anticoagulation  Coronary artery disease status post CABG  without angina pectoris Echo 01/29/2021: LVEF 55% MPI outside facility April 2022: Mild reversible perfusion defect per report suggestive of peri-infarct ischemia.  In the past and today would like to hold off on invasive angiography. Medications reconciled. Chest pain-free. Current LDL 38 mg/dL Not on aspirin given his history of GI bleeds due to AVMs and already being on Xarelto for thromboembolic prophylaxis.  Bilateral carotid artery stenosis Continue aspirin and statin therapy. Repeat carotid duplex April 2023  Mixed hyperlipidemia Currently on Crestor and Zetia.   He denies myalgia or other side effects. Most recent lipids dated 12/31/2020 reviewed as noted above.  Current LDL  38 mg/dL Currently managed by primary care provider.  Atherosclerosis of aorta (HCC) Continue aspirin and statin therapy  Benign hypertension Home blood pressure log reviewed. Continue current medical therapy.   Did not uptitrate antihypertensive medication during today's office visit as he already feels tired, fatigued at the current numbers. Enrolled into principal care management.  Follow longitudinally  IPF (idiopathic pulmonary fibrosis) ([place]) Follows with pulmonary medicine.  FINAL MEDICATION LIST END OF ENCOUNTER: No orders of the defined types were placed in this encounter.   There are no discontinued medications.    Current Outpatient Medications:    ezetimibe (ZETIA) 10 MG tablet, Take 10 mg by mouth daily., Disp: , Rfl:    famotidine (PEPCID) 20 MG tablet, Take 20 mg by mouth 2 (two) times daily., Disp: , Rfl:    fluticasone furoate-vilanterol (BREO ELLIPTA) 100-25 MCG/ACT AEPB, Inhale 1 puff into the lungs daily., Disp: 60 each, Rfl: 2   hydrALAZINE (APRESOLINE) 25 MG tablet, TAKE 1 TABLET BY MOUTH THREE TIMES A DAY, Disp: 270 tablet, Rfl: 0   isosorbide dinitrate (ISORDIL) 20 MG tablet, TAKE 1  TABLET BY MOUTH THREE TIMES A DAY, Disp: 270 tablet, Rfl: 0   naproxen (NAPROSYN) 375 MG tablet, Take 375 mg by mouth 2 (two) times daily with a meal., Disp: , Rfl:    rivaroxaban (XARELTO) 20 MG TABS tablet, Take 1 tablet (20 mg total) by mouth daily with supper., Disp: 90 tablet, Rfl: 0   rosuvastatin (CRESTOR) 20 MG tablet, Take 1 tablet (20 mg total) by mouth at bedtime for 90 doses., Disp: , Rfl:    sacubitril-valsartan (ENTRESTO) 49-51 MG, Take 1 tablet by mouth 2 (two) times daily., Disp: 60 tablet, Rfl: 0   tamsulosin (FLOMAX) 0.4 MG CAPS capsule, Take 0.4 mg by mouth daily., Disp: , Rfl:    torsemide (DEMADEX) 5 MG tablet, TAKE 1 TABLET BY MOUTH EVERY DAY IN THE MORNING, Disp: 30 tablet, Rfl: 0  No orders of the defined types were placed in this encounter.  There are no Patient Instructions on file for this visit.   --Continue cardiac medications as reconciled in final medication list. --Return in about 3 months (around 05/26/2021) for Follow up, A. fib, CAD, heart failure management.. Or sooner if needed. --Continue follow-up with your primary care physician regarding the management of your other chronic comorbid conditions.  Patient's questions and concerns were addressed to his satisfaction. He voices understanding of the instructions provided during this encounter.   This note was created using a voice recognition software as a result there may be grammatical errors inadvertently enclosed that do not reflect the nature of this encounter. Every attempt is made to correct such errors.  Rex Kras, [place], [facility]  Pager: [phone] Office: [phone]

## 2021-03-06 ENCOUNTER — Other Ambulatory Visit: Payer: Self-pay | Admitting: Cardiology

## 2021-03-06 DIAGNOSIS — I5031 Acute diastolic (congestive) heart failure: Secondary | ICD-10-CM

## 2021-03-06 DIAGNOSIS — R0609 Other forms of dyspnea: Secondary | ICD-10-CM

## 2021-03-06 MED ORDER — ENTRESTO 49-51 MG PO TABS: 1.0000 | ORAL_TABLET | Freq: Two times a day (BID) | ORAL | 0 refills | Status: DC

## 2021-03-17 ENCOUNTER — Other Ambulatory Visit: Payer: Self-pay | Admitting: Cardiology

## 2021-03-17 DIAGNOSIS — R0609 Other forms of dyspnea: Secondary | ICD-10-CM

## 2021-03-28 ENCOUNTER — Other Ambulatory Visit: Payer: Self-pay | Admitting: Pulmonary Disease

## 2021-04-07 ENCOUNTER — Other Ambulatory Visit: Payer: Self-pay | Admitting: Cardiology

## 2021-04-07 DIAGNOSIS — I2510 Atherosclerotic heart disease of native coronary artery without angina pectoris: Secondary | ICD-10-CM

## 2021-04-07 DIAGNOSIS — I10 Essential (primary) hypertension: Secondary | ICD-10-CM

## 2021-04-08 ENCOUNTER — Other Ambulatory Visit: Payer: Self-pay | Admitting: Cardiology

## 2021-04-08 DIAGNOSIS — I5031 Acute diastolic (congestive) heart failure: Secondary | ICD-10-CM

## 2021-04-08 DIAGNOSIS — R0609 Other forms of dyspnea: Secondary | ICD-10-CM

## 2021-04-09 ENCOUNTER — Other Ambulatory Visit: Payer: Self-pay | Admitting: Student

## 2021-04-09 MED ORDER — ENTRESTO 49-51 MG PO TABS: 1.0000 | ORAL_TABLET | Freq: Two times a day (BID) | ORAL | 3 refills | Status: DC

## 2021-04-10 ENCOUNTER — Other Ambulatory Visit: Payer: Self-pay | Admitting: Cardiology

## 2021-04-10 DIAGNOSIS — R0609 Other forms of dyspnea: Secondary | ICD-10-CM

## 2021-04-15 ENCOUNTER — Emergency Department (HOSPITAL_COMMUNITY): Payer: Medicare Other

## 2021-04-15 ENCOUNTER — Other Ambulatory Visit: Payer: Self-pay

## 2021-04-15 ENCOUNTER — Encounter (HOSPITAL_COMMUNITY): Payer: Self-pay | Admitting: Emergency Medicine

## 2021-04-15 ENCOUNTER — Emergency Department (HOSPITAL_COMMUNITY)
Admission: EM | Admit: 2021-04-15 | Discharge: 2021-04-15 | Disposition: A | Discharge status: Home / Self Care | Payer: Medicare Other | Attending: Emergency Medicine | Admitting: Emergency Medicine

## 2021-04-15 DIAGNOSIS — I110 Hypertensive heart disease with heart failure: Secondary | ICD-10-CM | POA: Diagnosis not present

## 2021-04-15 DIAGNOSIS — I5030 Unspecified diastolic (congestive) heart failure: Secondary | ICD-10-CM | POA: Insufficient documentation

## 2021-04-15 DIAGNOSIS — Z87891 Personal history of nicotine dependence: Secondary | ICD-10-CM | POA: Diagnosis not present

## 2021-04-15 DIAGNOSIS — Z951 Presence of aortocoronary bypass graft: Secondary | ICD-10-CM | POA: Diagnosis not present

## 2021-04-15 DIAGNOSIS — S0990XA Unspecified injury of head, initial encounter: Secondary | ICD-10-CM | POA: Diagnosis present

## 2021-04-15 DIAGNOSIS — I4891 Unspecified atrial fibrillation: Secondary | ICD-10-CM | POA: Insufficient documentation

## 2021-04-15 DIAGNOSIS — W1809XA Striking against other object with subsequent fall, initial encounter: Secondary | ICD-10-CM | POA: Diagnosis not present

## 2021-04-15 DIAGNOSIS — Z7901 Long term (current) use of anticoagulants: Secondary | ICD-10-CM | POA: Insufficient documentation

## 2021-04-15 DIAGNOSIS — I2510 Atherosclerotic heart disease of native coronary artery without angina pectoris: Secondary | ICD-10-CM | POA: Diagnosis not present

## 2021-04-15 DIAGNOSIS — W19XXXA Unspecified fall, initial encounter: Secondary | ICD-10-CM

## 2021-04-15 DIAGNOSIS — Z8616 Personal history of COVID-19: Secondary | ICD-10-CM | POA: Insufficient documentation

## 2021-04-15 DIAGNOSIS — J189 Pneumonia, unspecified organism: Secondary | ICD-10-CM

## 2021-04-15 DIAGNOSIS — S0003XA Contusion of scalp, initial encounter: Secondary | ICD-10-CM | POA: Diagnosis not present

## 2021-04-15 DIAGNOSIS — I10 Essential (primary) hypertension: Secondary | ICD-10-CM | POA: Insufficient documentation

## 2021-04-15 DIAGNOSIS — Z79899 Other long term (current) drug therapy: Secondary | ICD-10-CM | POA: Insufficient documentation

## 2021-04-15 DIAGNOSIS — J181 Lobar pneumonia, unspecified organism: Secondary | ICD-10-CM | POA: Insufficient documentation

## 2021-04-15 LAB — PROTIME-INR
INR: 2.2 — ABNORMAL HIGH (ref 0.8–1.2)
Prothrombin Time: 24.3 seconds — ABNORMAL HIGH (ref 11.4–15.2)

## 2021-04-15 LAB — CBC WITH DIFFERENTIAL/PLATELET
Abs Immature Granulocytes: 0.12 10*3/uL — ABNORMAL HIGH (ref 0.00–0.07)
Basophils Absolute: 0 10*3/uL (ref 0.0–0.1)
Basophils Relative: 0 %
Eosinophils Absolute: 0 10*3/uL (ref 0.0–0.5)
Eosinophils Relative: 0 %
HCT: 36.8 % — ABNORMAL LOW (ref 39.0–52.0)
Hemoglobin: 12.3 g/dL — ABNORMAL LOW (ref 13.0–17.0)
Immature Granulocytes: 1 %
Lymphocytes Relative: 4 %
Lymphs Abs: 0.7 10*3/uL (ref 0.7–4.0)
MCH: 31.6 pg (ref 26.0–34.0)
MCHC: 33.4 g/dL (ref 30.0–36.0)
MCV: 94.6 fL (ref 80.0–100.0)
Monocytes Absolute: 1.9 10*3/uL — ABNORMAL HIGH (ref 0.1–1.0)
Monocytes Relative: 12 %
Neutro Abs: 13.5 10*3/uL — ABNORMAL HIGH (ref 1.7–7.7)
Neutrophils Relative %: 83 %
Platelets: 203 10*3/uL (ref 150–400)
RBC: 3.89 MIL/uL — ABNORMAL LOW (ref 4.22–5.81)
RDW: 13.1 % (ref 11.5–15.5)
WBC: 16.2 10*3/uL — ABNORMAL HIGH (ref 4.0–10.5)
nRBC: 0 % (ref 0.0–0.2)

## 2021-04-15 LAB — COMPREHENSIVE METABOLIC PANEL
ALT: 14 U/L (ref 0–44)
AST: 21 U/L (ref 15–41)
Albumin: 3.3 g/dL — ABNORMAL LOW (ref 3.5–5.0)
Alkaline Phosphatase: 49 U/L (ref 38–126)
Anion gap: 10 (ref 5–15)
BUN: 23 mg/dL (ref 8–23)
CO2: 22 mmol/L (ref 22–32)
Calcium: 8.9 mg/dL (ref 8.9–10.3)
Chloride: 101 mmol/L (ref 98–111)
Creatinine, Ser: 0.95 mg/dL (ref 0.61–1.24)
GFR, Estimated: >60 mL/min (ref >60)
Glucose, Bld: 132 mg/dL — ABNORMAL HIGH (ref 70–99)
Potassium: 4 mmol/L (ref 3.5–5.1)
Sodium: 133 mmol/L — ABNORMAL LOW (ref 135–145)
Total Bilirubin: 0.5 mg/dL (ref 0.3–1.2)
Total Protein: 6.8 g/dL (ref 6.5–8.1)

## 2021-04-15 LAB — BRAIN NATRIURETIC PEPTIDE: B Natriuretic Peptide: 224.3 pg/mL — ABNORMAL HIGH (ref 0.0–100.0)

## 2021-04-15 LAB — APTT: aPTT: 46 seconds — ABNORMAL HIGH (ref 24–36)

## 2021-04-15 MED ORDER — AMOXICILLIN-POT CLAVULANATE 875-125 MG PO TABS
1.0000 | ORAL_TABLET | Freq: Two times a day (BID) | ORAL | 0 refills | Status: DC
Start: 2021-04-15 — End: 2021-04-24

## 2021-04-15 MED ORDER — DOXYCYCLINE HYCLATE 100 MG PO CAPS: 100.0000 mg | ORAL_CAPSULE | Freq: Two times a day (BID) | ORAL | 0 refills | Status: DC

## 2021-04-15 MED ORDER — SODIUM CHLORIDE 0.9 % IV SOLN
3.0000 g | Freq: Once | INTRAVENOUS | Status: AC
Start: 2021-04-15 — End: 2021-04-15
  Administered 2021-04-15: 3 g via INTRAVENOUS
  Filled 2021-04-15: qty 8

## 2021-04-15 NOTE — ED Provider Notes (Signed)
?[facility] ?Provider Note ? ? ?CSN: [id number] ?Arrival date & time: 04/15/21  1615 ? ?  ? ?History ?Chief Complaint  ?Patient presents with  ? Fall  ? ? ?Calvin Brown is a 88 y.o. male w/ Hypertension, hyperlipidemia, CVA in 2015, persistent atrial fibrillation, CAD status post CABG in 1992 (LIMA to the LAD and SVG to OM) at age of 62, HFpEF, history of GI bleed secondary to AVM in 2015, aortic atherosclerosis, former smoker, idiopathic pulmonary fibrosis, advanced age presenting for fall where he fell and hit his head.  Patient takes Xarelto.  Initially unable to tell wife the details of the fall, but now remembering all of them.  Wife found him confused about the details of the event when she got home.  Patient has been going to the get the mail and all of this when her clothing was on his way back in holding the mail in his cane and states that he felt unsteady walking up the steps into the house and fell backwards hitting his head.  He remembers trying to protect his hip.  Patient reports feeling like himself at this time.  ? ?Patient did have COVID 3 weeks ago and has been slowly recovering.  Last few days his cough has seemed to come back and be worse.  He is also feeling fatigued and the fatigue has worsened over the last 3 days. ? ?HPI ? ?  ? ?Home Medications ?Prior to Admission medications   ?Medication Sig Start Date End Date Taking? Authorizing Provider  ?amoxicillin-clavulanate (AUGMENTIN) 875-125 MG tablet Take 1 tablet by mouth every 12 (twelve) hours. 04/15/21  Yes Lupita Dawn, MD  ?doxycycline (VIBRAMYCIN) 100 MG capsule Take 1 capsule (100 mg total) by mouth 2 (two) times daily. 04/15/21  Yes Lupita Dawn, MD  ?[place]air Patter 100-25 MCG/ACT AEPB TAKE 1 PUFF BY MOUTH EVERY DAY 03/28/21   Margaretha Seeds, MD  ?ENTRESTO 49-51 MG TAKE 1 TABLET BY MOUTH TWICE A DAY 04/10/21   Tolia, Sunit, DO  ?ezetimibe (ZETIA) 10 MG tablet Take 10 mg by mouth daily.    [provider]  ?famotidine (PEPCID) 20 MG tablet Take 20 mg by mouth 2 (two) times daily.    [provider]  ?hydrALAZINE (APRESOLINE) 25 MG tablet TAKE 1 TABLET BY MOUTH THREE TIMES A DAY 02/20/21   Tolia, Sunit, DO  ?isosorbide dinitrate (ISORDIL) 20 MG tablet TAKE 1 TABLET BY MOUTH THREE TIMES A DAY 02/20/21   Tolia, Sunit, DO  ?naproxen (NAPROSYN) 375 MG tablet Take 375 mg by mouth 2 (two) times daily with a meal.    [provider]  ?rivaroxaban (XARELTO) 20 MG TABS tablet Take 1 tablet (20 mg total) by mouth daily with supper. 10/08/20 02/26/21  Tolia, Sunit, DO  ?rosuvastatin (CRESTOR) 20 MG tablet Take 1 tablet (20 mg total) by mouth at bedtime for 90 doses. 01/30/21 04/30/21  Tolia, Sunit, DO  ?sacubitril-valsartan (ENTRESTO) 49-51 MG Take 1 tablet by mouth 2 (two) times daily. 04/09/21   Cantwell, Celeste C, PA-C  ?tamsulosin (FLOMAX) 0.4 MG CAPS capsule Take 0.4 mg by mouth daily. 06/28/20   [provider]  ?torsemide (DEMADEX) 5 MG tablet TAKE 1 TABLET BY MOUTH EVERY DAY IN THE MORNING 04/10/21   Rex Kras, DO  ?   ? ?Allergies    ?Patient has no known allergies.   ? ?Review of Systems   ?Review of Systems  ?Constitutional:  Positive for fatigue. Negative  for chills and fever.  ?Respiratory:  Positive for cough.   ?Cardiovascular:  Negative for chest pain and palpitations.  ?Gastrointestinal:  Negative for abdominal pain, nausea and vomiting.  ?Skin:  Positive for wound.  ?Neurological:  Negative for numbness and headaches.  ? ?Physical Exam ?Updated Vital Signs ?BP (!) 150/80   Pulse 82   Temp 98.2 ?F (36.8 ?C) (Oral)   Resp 18   Ht 5\' 11"  (1.803 m)   Wt 83.9 kg   SpO2 96%   BMI 25.80 kg/m?  ?Physical Exam ?Vitals and nursing note reviewed.  ?HENT:  ?   Head:  ?   Comments: Large posterior parietal hematoma  ?Eyes:  ?   Extraocular Movements: Extraocular movements intact.  ?   Pupils: Pupils are equal, round, and reactive to light.  ?Cardiovascular:  ?   Rate and Rhythm:  Normal rate and regular rhythm.  ?Pulmonary:  ?   Effort: Pulmonary effort is normal. No respiratory distress.  ?Abdominal:  ?   Tenderness: There is no abdominal tenderness. There is no guarding or rebound.  ?Musculoskeletal:     ?   General: No swelling, tenderness, deformity or signs of injury.  ?   Cervical back: Normal range of motion. No rigidity or tenderness.  ?   Right lower leg: No edema.  ?   Left lower leg: No edema.  ?Neurological:  ?   Mental Status: He is alert and oriented to person, place, and time.  ?Psychiatric:     ?   Mood and Affect: Mood normal.     ?   Behavior: Behavior normal.  ? ?MENTAL STATUS EXAM:    ?Orientation: Alert and oriented to person, place and time.  ?Memory: Cooperative, follows commands well.  ?Language: Speech is clear and language is normal.  ? ?CRANIAL NERVES:    ?CN 2 (Optic): Visual fields intact to confrontation.  ?CN 3,4,6 (EOM): Pupils equal and reactive to light. Full extraocular eye movement without nystagmus.  ?CN 5 (Trigeminal): Facial sensation is normal, no weakness of masticatory muscles.  ?CN 7 (Facial): No facial weakness or asymmetry.  ?CN 8 (Auditory): Auditory acuity grossly normal.  ?CN 9,10 (Glossophar): The uvula is midline, the palate elevates symmetrically.  ?CN 11 (spinal access): Normal sternocleidomastoid and trapezius strength.  ?CN 12 (Hypoglossal): The tongue is midline. No atrophy or fasciculations..  ? ?MOTOR:  Muscle Strength: 5/5 RUE, LUE, RLE, LLE ? ?REFLEXES: No clonus.  ? ?COORDINATION:   Intact finger-to-nose, no tremor, no pronator drift.  ? ?SENSATION:   Intact to light touch all four extremities. ? ?GAIT: Gait normal without ataxia ? ? ?ED Results / Procedures / Treatments   ?Labs ?(all labs ordered are listed, but only abnormal results are displayed) ?Labs Reviewed  ?CBC WITH DIFFERENTIAL/PLATELET - Abnormal; Notable for the following components:  ?    Result Value  ? WBC 16.2 (*)   ? RBC 3.89 (*)   ? Hemoglobin 12.3 (*)   ? HCT 36.8  (*)   ? Neutro Abs 13.5 (*)   ? Monocytes Absolute 1.9 (*)   ? Abs Immature Granulocytes 0.12 (*)   ? All other components within normal limits  ?COMPREHENSIVE METABOLIC PANEL - Abnormal; Notable for the following components:  ? Sodium 133 (*)   ? Glucose, Bld 132 (*)   ? Albumin 3.3 (*)   ? All other components within normal limits  ?BRAIN NATRIURETIC PEPTIDE - Abnormal; Notable for the following components:  ? B Natriuretic  Peptide 224.3 (*)   ? All other components within normal limits  ?PROTIME-INR - Abnormal; Notable for the following components:  ? Prothrombin Time 24.3 (*)   ? INR 2.2 (*)   ? All other components within normal limits  ?APTT - Abnormal; Notable for the following components:  ? aPTT 46 (*)   ? All other components within normal limits  ? ? ?EKG ?None ? ?Radiology ?DG Chest 2 View ? ?Result Date: 04/15/2021 ?CLINICAL DATA:  Fall, found on ground floor. Hematoma to posterior head. EXAM: CHEST - 2 VIEW COMPARISON:  01/27/2021, chest CT 05/28/2020 FINDINGS: Post median sternotomy and CABG. Stable heart size and mediastinal contours. There is patchy opacity in the right middle and right upper lobes. This was not seen on prior exam. No pneumothorax or pleural effusion. No visualized rib fracture or acute osseous abnormalities. IMPRESSION: Patchy opacity in the right middle and right upper lobes may represent pneumonia or progression of chronic lung disease. Electronically Signed   By: Keith Rake M.D.   On: 04/15/2021 17:42  ? ?DG Pelvis 1-2 Views ? ?Result Date: 04/15/2021 ?CLINICAL DATA:  Fall, found on ground floor. EXAM: PELVIS - 1-2 VIEW COMPARISON:  None. FINDINGS: The cortical margins of the bony pelvis are intact. No fracture. Pubic symphysis and sacroiliac joints are congruent. Mild bilateral hip joint space narrowing. Both femoral heads are well-seated in the respective acetabula. IMPRESSION: No pelvic fracture. Electronically Signed   By: Keith Rake M.D.   On: 04/15/2021 17:43   ? ?CT Head Wo Contrast ? ?Result Date: 04/15/2021 ?CLINICAL DATA:  Trauma EXAM: CT HEAD WITHOUT CONTRAST TECHNIQUE: Contiguous axial images were obtained from the base of the skull through the vertex without intraveno

## 2021-04-15 NOTE — Progress Notes (Signed)
?   04/15/21 1615  ?Clinical Encounter Type  ?Visited With Patient not available;Health care provider  ?Visit Type Initial;Trauma  ?Referral From Nurse  ?Consult/Referral To Chaplain  ? ?Chaplain responded to a level two trauma alert.  Patient was receiving care, nurse informed chaplain that the patient was coherent and now resting.  ?No family was present.  ? ?Valerie Roys ?Chaplain Resident  ?[facility]  ?[phone] ? ?

## 2021-04-15 NOTE — ED Triage Notes (Signed)
Patient BIB family for evaluation after patient was found on garage floor, hematoma to posterior head, takes xarelto. Patient does not remember fall. Is alert and in no apparent distress at this time. No visible hemorrhage, no obvious deformity, pupils are  30mm equal, round, and reactive to light. ?

## 2021-04-15 NOTE — ED Notes (Signed)
Trauma Response Nurse Documentation ? ? ?Calvin Brown is a 88 y.o. male arriving to [facility] ED via EMS ? ?On Xarelto (rivaroxaban) daily. Trauma was activated as a Level 2 by ED Charge RN based on the following trauma criteria Elderly patients > 65 with head trauma on anti-coagulation (excluding ASA). Patient cleared for CT by Dr. Kathrynn Humble. Patient to CT with team. GCS 15. ? ?History  ? Past Medical History:  ?Diagnosis Date  ? Atrial fibrillation ([place])   ? CAD (coronary artery disease)   ? CVA (cerebral vascular accident) ([place]) 05/27/2013  ? GI bleed 05/27/2013  ? HTN (hypertension)   ? Hx of CABG   ? Possibly 2015?  ? Hyperlipidemia   ?  ? Past Surgical History:  ?Procedure Laterality Date  ? CORONARY ARTERY BYPASS GRAFT  1992  ? ESOPHAGOGASTRODUODENOSCOPY    ? TONSILLECTOMY AND ADENOIDECTOMY    ?  ? ? ? ?Initial Focused Assessment (If applicable, or please see trauma documentation): ?- Hematoma to posterior head ?- PERRLA ? ?CT's Completed:   ?CT Head and CT C-Spine  ? ?Interventions:  ?CTH and c-spine ?- delay in CT due to TRN having another trauma at the same time. ? ?Plan for disposition:  ?Other  ? ?Consults completed:  ?none at 1745 ? ? ?Avaree Gilberti W  ?Trauma Response RN ? ?Please call TRN at [phone] for further assistance. ? ? ?

## 2021-04-15 NOTE — Discharge Instructions (Addendum)
You were evaluated in the Emergency Department and after careful evaluation, we did not find any emergent condition requiring admission or further testing in the hospital. ? ?Your exam/testing today showed a pneumonia in your post-COVID timeframe.  You need to take your antibiotics daily as prescribed and follow-up closely with your doctor.  If you get any worse, please come to the ED are to be admitted to the hospital. ? ?Please return to the Emergency Department if you experience any worsening of your condition.  Thank you for allowing [place] to be a part of your care.  ?

## 2021-04-15 NOTE — ED Notes (Signed)
Patient transported to X-ray 

## 2021-04-15 NOTE — ED Notes (Signed)
Pt ambulated and spo2 stayed above 95%. Tolerateed well ?

## 2021-04-16 ENCOUNTER — Telehealth: Payer: Self-pay

## 2021-04-16 NOTE — Telephone Encounter (Signed)
-----   Message from Chi Mechele Collin, MD sent at 04/11/2021  5:02 PM EDT ----- ?Regarding: Follow-up and PFTs ?Please schedule patient for PFTs and follow-up in June - JE ? ?

## 2021-04-16 NOTE — Telephone Encounter (Signed)
Spk with pt's wife PFT and ov scheduled for 6/12 at 1p and 2p ?

## 2021-04-21 ENCOUNTER — Emergency Department (HOSPITAL_COMMUNITY): Payer: Medicare Other

## 2021-04-21 ENCOUNTER — Inpatient Hospital Stay (HOSPITAL_COMMUNITY)
Admission: EM | Admit: 2021-04-21 | Discharge: 2021-04-24 | DRG: 085 | Disposition: A | Discharge status: Rehabilitation Facility | Payer: Medicare Other | Attending: Internal Medicine | Admitting: Internal Medicine

## 2021-04-21 ENCOUNTER — Other Ambulatory Visit: Payer: Self-pay

## 2021-04-21 ENCOUNTER — Encounter (HOSPITAL_COMMUNITY): Payer: Self-pay | Admitting: *Deleted

## 2021-04-21 DIAGNOSIS — Z8249 Family history of ischemic heart disease and other diseases of the circulatory system: Secondary | ICD-10-CM

## 2021-04-21 DIAGNOSIS — Z20822 Contact with and (suspected) exposure to covid-19: Secondary | ICD-10-CM | POA: Diagnosis present

## 2021-04-21 DIAGNOSIS — K5900 Constipation, unspecified: Secondary | ICD-10-CM | POA: Diagnosis present

## 2021-04-21 DIAGNOSIS — E871 Hypo-osmolality and hyponatremia: Secondary | ICD-10-CM | POA: Diagnosis present

## 2021-04-21 DIAGNOSIS — J189 Pneumonia, unspecified organism: Secondary | ICD-10-CM | POA: Diagnosis present

## 2021-04-21 DIAGNOSIS — M810 Age-related osteoporosis without current pathological fracture: Secondary | ICD-10-CM | POA: Diagnosis present

## 2021-04-21 DIAGNOSIS — Z7951 Long term (current) use of inhaled steroids: Secondary | ICD-10-CM

## 2021-04-21 DIAGNOSIS — I110 Hypertensive heart disease with heart failure: Secondary | ICD-10-CM | POA: Diagnosis present

## 2021-04-21 DIAGNOSIS — Z803 Family history of malignant neoplasm of breast: Secondary | ICD-10-CM

## 2021-04-21 DIAGNOSIS — U099 Post covid-19 condition, unspecified: Secondary | ICD-10-CM | POA: Diagnosis present

## 2021-04-21 DIAGNOSIS — Z821 Family history of blindness and visual loss: Secondary | ICD-10-CM

## 2021-04-21 DIAGNOSIS — W109XXD Fall (on) (from) unspecified stairs and steps, subsequent encounter: Secondary | ICD-10-CM | POA: Diagnosis present

## 2021-04-21 DIAGNOSIS — R2681 Unsteadiness on feet: Secondary | ICD-10-CM | POA: Diagnosis present

## 2021-04-21 DIAGNOSIS — I4819 Other persistent atrial fibrillation: Secondary | ICD-10-CM | POA: Diagnosis present

## 2021-04-21 DIAGNOSIS — R4182 Altered mental status, unspecified: Secondary | ICD-10-CM | POA: Diagnosis present

## 2021-04-21 DIAGNOSIS — I5032 Chronic diastolic (congestive) heart failure: Secondary | ICD-10-CM | POA: Diagnosis present

## 2021-04-21 DIAGNOSIS — K922 Gastrointestinal hemorrhage, unspecified: Secondary | ICD-10-CM | POA: Diagnosis present

## 2021-04-21 DIAGNOSIS — Z87891 Personal history of nicotine dependence: Secondary | ICD-10-CM

## 2021-04-21 DIAGNOSIS — I2510 Atherosclerotic heart disease of native coronary artery without angina pectoris: Secondary | ICD-10-CM | POA: Diagnosis present

## 2021-04-21 DIAGNOSIS — S065X0S Traumatic subdural hemorrhage without loss of consciousness, sequela: Secondary | ICD-10-CM | POA: Diagnosis not present

## 2021-04-21 DIAGNOSIS — I69898 Other sequelae of other cerebrovascular disease: Secondary | ICD-10-CM | POA: Diagnosis not present

## 2021-04-21 DIAGNOSIS — I952 Hypotension due to drugs: Secondary | ICD-10-CM | POA: Diagnosis not present

## 2021-04-21 DIAGNOSIS — M109 Gout, unspecified: Secondary | ICD-10-CM | POA: Diagnosis present

## 2021-04-21 DIAGNOSIS — S064X0A Epidural hemorrhage without loss of consciousness, initial encounter: Principal | ICD-10-CM | POA: Diagnosis present

## 2021-04-21 DIAGNOSIS — R443 Hallucinations, unspecified: Secondary | ICD-10-CM

## 2021-04-21 DIAGNOSIS — Z8673 Personal history of transient ischemic attack (TIA), and cerebral infarction without residual deficits: Secondary | ICD-10-CM

## 2021-04-21 DIAGNOSIS — I69822 Dysarthria following other cerebrovascular disease: Secondary | ICD-10-CM | POA: Diagnosis not present

## 2021-04-21 DIAGNOSIS — E663 Overweight: Secondary | ICD-10-CM | POA: Diagnosis present

## 2021-04-21 DIAGNOSIS — N401 Enlarged prostate with lower urinary tract symptoms: Secondary | ICD-10-CM | POA: Diagnosis present

## 2021-04-21 DIAGNOSIS — I9581 Postprocedural hypotension: Secondary | ICD-10-CM | POA: Diagnosis not present

## 2021-04-21 DIAGNOSIS — J84112 Idiopathic pulmonary fibrosis: Secondary | ICD-10-CM | POA: Diagnosis present

## 2021-04-21 DIAGNOSIS — R269 Unspecified abnormalities of gait and mobility: Secondary | ICD-10-CM

## 2021-04-21 DIAGNOSIS — W1830XA Fall on same level, unspecified, initial encounter: Secondary | ICD-10-CM | POA: Diagnosis present

## 2021-04-21 DIAGNOSIS — G9389 Other specified disorders of brain: Secondary | ICD-10-CM | POA: Diagnosis present

## 2021-04-21 DIAGNOSIS — Z79899 Other long term (current) drug therapy: Secondary | ICD-10-CM

## 2021-04-21 DIAGNOSIS — R32 Unspecified urinary incontinence: Secondary | ICD-10-CM | POA: Diagnosis present

## 2021-04-21 DIAGNOSIS — S069X0S Unspecified intracranial injury without loss of consciousness, sequela: Secondary | ICD-10-CM | POA: Diagnosis not present

## 2021-04-21 DIAGNOSIS — D62 Acute posthemorrhagic anemia: Secondary | ICD-10-CM | POA: Diagnosis present

## 2021-04-21 DIAGNOSIS — Z9889 Other specified postprocedural states: Secondary | ICD-10-CM | POA: Diagnosis not present

## 2021-04-21 DIAGNOSIS — D6832 Hemorrhagic disorder due to extrinsic circulating anticoagulants: Secondary | ICD-10-CM | POA: Diagnosis present

## 2021-04-21 DIAGNOSIS — K921 Melena: Secondary | ICD-10-CM | POA: Diagnosis present

## 2021-04-21 DIAGNOSIS — I4891 Unspecified atrial fibrillation: Secondary | ICD-10-CM | POA: Diagnosis present

## 2021-04-21 DIAGNOSIS — I69892 Facial weakness following other cerebrovascular disease: Secondary | ICD-10-CM | POA: Diagnosis not present

## 2021-04-21 DIAGNOSIS — R440 Auditory hallucinations: Secondary | ICD-10-CM | POA: Diagnosis present

## 2021-04-21 DIAGNOSIS — Z951 Presence of aortocoronary bypass graft: Secondary | ICD-10-CM | POA: Diagnosis not present

## 2021-04-21 DIAGNOSIS — M21372 Foot drop, left foot: Secondary | ICD-10-CM | POA: Diagnosis present

## 2021-04-21 DIAGNOSIS — R351 Nocturia: Secondary | ICD-10-CM | POA: Diagnosis present

## 2021-04-21 DIAGNOSIS — Z7901 Long term (current) use of anticoagulants: Secondary | ICD-10-CM

## 2021-04-21 DIAGNOSIS — Z8616 Personal history of COVID-19: Secondary | ICD-10-CM | POA: Diagnosis not present

## 2021-04-21 DIAGNOSIS — E785 Hyperlipidemia, unspecified: Secondary | ICD-10-CM | POA: Diagnosis present

## 2021-04-21 DIAGNOSIS — S064XAA Epidural hemorrhage with loss of consciousness status unknown, initial encounter: Secondary | ICD-10-CM

## 2021-04-21 DIAGNOSIS — S065XAD Traumatic subdural hemorrhage with loss of consciousness status unknown, subsequent encounter: Secondary | ICD-10-CM | POA: Diagnosis present

## 2021-04-21 DIAGNOSIS — S069X9S Unspecified intracranial injury with loss of consciousness of unspecified duration, sequela: Secondary | ICD-10-CM | POA: Diagnosis not present

## 2021-04-21 DIAGNOSIS — T45515A Adverse effect of anticoagulants, initial encounter: Secondary | ICD-10-CM | POA: Diagnosis present

## 2021-04-21 DIAGNOSIS — D6859 Other primary thrombophilia: Secondary | ICD-10-CM | POA: Diagnosis present

## 2021-04-21 DIAGNOSIS — I959 Hypotension, unspecified: Secondary | ICD-10-CM | POA: Diagnosis not present

## 2021-04-21 DIAGNOSIS — Y9301 Activity, walking, marching and hiking: Secondary | ICD-10-CM | POA: Diagnosis present

## 2021-04-21 DIAGNOSIS — F5104 Psychophysiologic insomnia: Secondary | ICD-10-CM | POA: Diagnosis present

## 2021-04-21 DIAGNOSIS — E876 Hypokalemia: Secondary | ICD-10-CM | POA: Diagnosis present

## 2021-04-21 LAB — BASIC METABOLIC PANEL
Anion gap: 7 (ref 5–15)
BUN: 20 mg/dL (ref 8–23)
CO2: 28 mmol/L (ref 22–32)
Calcium: 8.7 mg/dL — ABNORMAL LOW (ref 8.9–10.3)
Chloride: 96 mmol/L — ABNORMAL LOW (ref 98–111)
Creatinine, Ser: 0.88 mg/dL (ref 0.61–1.24)
GFR, Estimated: >60 mL/min (ref >60)
Glucose, Bld: 90 mg/dL (ref 70–99)
Potassium: 3.8 mmol/L (ref 3.5–5.1)
Sodium: 131 mmol/L — ABNORMAL LOW (ref 135–145)

## 2021-04-21 LAB — CBC
HCT: 34.3 % — ABNORMAL LOW (ref 39.0–52.0)
Hemoglobin: 11.6 g/dL — ABNORMAL LOW (ref 13.0–17.0)
MCH: 31.2 pg (ref 26.0–34.0)
MCHC: 33.8 g/dL (ref 30.0–36.0)
MCV: 92.2 fL (ref 80.0–100.0)
Platelets: 265 10*3/uL (ref 150–400)
RBC: 3.72 MIL/uL — ABNORMAL LOW (ref 4.22–5.81)
RDW: 12.6 % (ref 11.5–15.5)
WBC: 9.9 10*3/uL (ref 4.0–10.5)
nRBC: 0 % (ref 0.0–0.2)

## 2021-04-21 LAB — HEPATIC FUNCTION PANEL
ALT: 45 U/L — ABNORMAL HIGH (ref 0–44)
AST: 31 U/L (ref 15–41)
Albumin: 3 g/dL — ABNORMAL LOW (ref 3.5–5.0)
Alkaline Phosphatase: 47 U/L (ref 38–126)
Bilirubin, Direct: 0.1 mg/dL (ref 0.0–0.2)
Indirect Bilirubin: 0.7 mg/dL (ref 0.3–0.9)
Total Bilirubin: 0.8 mg/dL (ref 0.3–1.2)
Total Protein: 6.4 g/dL — ABNORMAL LOW (ref 6.5–8.1)

## 2021-04-21 LAB — POC OCCULT BLOOD, ED: Fecal Occult Bld: POSITIVE — AB

## 2021-04-21 LAB — RESP PANEL BY RT-PCR (FLU A&B, COVID) ARPGX2
Influenza A by PCR: NEGATIVE
Influenza B by PCR: NEGATIVE
SARS Coronavirus 2 by RT PCR: NEGATIVE

## 2021-04-21 LAB — TROPONIN I (HIGH SENSITIVITY)
Troponin I (High Sensitivity): 21 ng/L — ABNORMAL HIGH (ref <18)
Troponin I (High Sensitivity): 18 ng/L — ABNORMAL HIGH (ref <18)

## 2021-04-21 LAB — HEMOGLOBIN AND HEMATOCRIT, BLOOD
HCT: 35.1 % — ABNORMAL LOW (ref 39.0–52.0)
Hemoglobin: 11.7 g/dL — ABNORMAL LOW (ref 13.0–17.0)

## 2021-04-21 LAB — LACTIC ACID, PLASMA
Lactic Acid, Venous: 1.1 mmol/L (ref 0.5–1.9)
Lactic Acid, Venous: 1 mmol/L (ref 0.5–1.9)

## 2021-04-21 MED ORDER — ROSUVASTATIN CALCIUM 20 MG PO TABS
20.0000 mg | ORAL_TABLET | Freq: Every day | ORAL | Status: DC
  Administered 2021-04-21 – 2021-04-23 (×3): 20 mg via ORAL
  Filled 2021-04-21 (×3): qty 1

## 2021-04-21 MED ORDER — PANTOPRAZOLE SODIUM 40 MG IV SOLR
40.0000 mg | Freq: Two times a day (BID) | INTRAVENOUS | Status: DC
  Administered 2021-04-21 – 2021-04-24 (×6): 40 mg via INTRAVENOUS
  Filled 2021-04-21 (×6): qty 10

## 2021-04-21 MED ORDER — MELATONIN 3 MG PO TABS
3.0000 mg | ORAL_TABLET | Freq: Every day | ORAL | Status: DC
Start: 2021-04-21 — End: 2021-04-24
  Administered 2021-04-21 – 2021-04-23 (×3): 3 mg via ORAL
  Filled 2021-04-21 (×3): qty 1

## 2021-04-21 MED ORDER — FLUTICASONE FUROATE-VILANTEROL 100-25 MCG/ACT IN AEPB
1.0000 | INHALATION_SPRAY | Freq: Every day | RESPIRATORY_TRACT | Status: DC
  Administered 2021-04-22 – 2021-04-24 (×3): 1 via RESPIRATORY_TRACT
  Filled 2021-04-21: qty 28

## 2021-04-21 MED ORDER — DOXYCYCLINE HYCLATE 100 MG PO TABS
100.0000 mg | ORAL_TABLET | Freq: Two times a day (BID) | ORAL | Status: DC
  Administered 2021-04-21 – 2021-04-22 (×2): 100 mg via ORAL
  Filled 2021-04-21 (×2): qty 1

## 2021-04-21 MED ORDER — AMOXICILLIN-POT CLAVULANATE 875-125 MG PO TABS
1.0000 | ORAL_TABLET | Freq: Two times a day (BID) | ORAL | Status: DC
  Administered 2021-04-21: 1 via ORAL
  Filled 2021-04-21: qty 1

## 2021-04-21 MED ORDER — BENZONATATE 100 MG PO CAPS
100.0000 mg | ORAL_CAPSULE | Freq: Two times a day (BID) | ORAL | Status: DC
Start: 2021-04-21 — End: 2021-04-24
  Administered 2021-04-21 – 2021-04-24 (×6): 100 mg via ORAL
  Filled 2021-04-21 (×6): qty 1

## 2021-04-21 MED ORDER — SODIUM CHLORIDE 0.9 % IV SOLN
2.0000 g | Freq: Once | INTRAVENOUS | Status: AC
  Administered 2021-04-21: 2 g via INTRAVENOUS
  Filled 2021-04-21: qty 2

## 2021-04-21 MED ORDER — GUAIFENESIN ER 600 MG PO TB12
1200.0000 mg | ORAL_TABLET | Freq: Two times a day (BID) | ORAL | Status: DC
  Administered 2021-04-21 – 2021-04-24 (×6): 1200 mg via ORAL
  Filled 2021-04-21 (×6): qty 2

## 2021-04-21 MED ORDER — EZETIMIBE 10 MG PO TABS
10.0000 mg | ORAL_TABLET | Freq: Every day | ORAL | Status: DC
  Administered 2021-04-22 – 2021-04-24 (×3): 10 mg via ORAL
  Filled 2021-04-21 (×3): qty 1

## 2021-04-21 MED ORDER — SACUBITRIL-VALSARTAN 49-51 MG PO TABS
1.0000 | ORAL_TABLET | Freq: Two times a day (BID) | ORAL | Status: DC
  Administered 2021-04-21 – 2021-04-24 (×6): 1 via ORAL
  Filled 2021-04-21 (×9): qty 1

## 2021-04-21 MED ORDER — VANCOMYCIN HCL 2000 MG/400ML IV SOLN
2000.0000 mg | Freq: Once | INTRAVENOUS | Status: AC
  Administered 2021-04-21: 2000 mg via INTRAVENOUS
  Filled 2021-04-21: qty 400

## 2021-04-21 MED ORDER — PANTOPRAZOLE SODIUM 40 MG IV SOLR: 40.0000 mg | Freq: Two times a day (BID) | INTRAVENOUS | Status: DC

## 2021-04-21 MED ORDER — TAMSULOSIN HCL 0.4 MG PO CAPS
0.4000 mg | ORAL_CAPSULE | Freq: Every day | ORAL | Status: DC
Start: 2021-04-22 — End: 2021-04-22

## 2021-04-21 MED ORDER — HYDRALAZINE HCL 10 MG PO TABS: 10.0000 mg | ORAL_TABLET | Freq: Four times a day (QID) | ORAL | Status: DC | PRN

## 2021-04-21 MED ORDER — PANTOPRAZOLE 80MG IVPB - SIMPLE MED
80.0000 mg | Freq: Once | INTRAVENOUS | Status: AC
  Administered 2021-04-21: 80 mg via INTRAVENOUS
  Filled 2021-04-21: qty 80

## 2021-04-21 MED ORDER — FAMOTIDINE 20 MG PO TABS
20.0000 mg | ORAL_TABLET | Freq: Two times a day (BID) | ORAL | Status: DC
  Administered 2021-04-21 – 2021-04-24 (×6): 20 mg via ORAL
  Filled 2021-04-21 (×6): qty 1

## 2021-04-21 NOTE — ED Notes (Signed)
The pt is also having bad dreams and light sensitivity and bed wetting for the past 2-3 days ?

## 2021-04-21 NOTE — H&P (Signed)
?History and Physical  ? ? ?Calvin Brown [id number] DOB: [birth year] DOA: 04/21/2021 ? ?PCP: Sueanne Margarita, DO (Confirm with patient/family/NH records and if not entered, this has to be entered at [facility] point of entry) ?Patient coming from: Home ? ?I have personally briefly reviewed patient's old medical records in [place] ? ?Chief Complaint: Very unsteady ? ?HPI: Calvin Brown is a 88 y.o. male with medical history significant of A-fib on Xarelto, CVA with chronic ambulation dysfunction, CAD status post CABG, GI bleed secondary to AVM status post cauterization, chronic diastolic CHF, pulmonary fibrosis, HTN, presented with new onset of melena and unsteady gait. ? ?Patient had COVID 3 weeks ago treated with 4 days of antiviral treatment.  Last week, patient developed new onset of productive cough with dark yellowish sputum.  6 days ago, patient had episode of fall while walking in a shopping mall and hit his head.  He denies any acute weakness or numbness of any of the blooms.  Patient came to ED head CT was negative for acute findings.  X-ray however showed patient has right-sided pneumonia and patient was started on Augmentin plus doxycycline. ? ?Patient reported improvement of cough and sputum production after taking p.o. antibiotics.  However over the last 3 to 4 days, patient started to have significant vivid nightmares, "someone talking to me at night" denies any visual hallucinations.  And family noticed patient became more unsteady in the mornings.  Denies any headache, no feeling nauseous or vomiting. ? ?Yesterday patient started to positive dark-colored stool, no abdominal pain feeling of lightheadedness no chest pain or shortness of breath. ? ?Patient was recently started on osteoporosis medications [place] last month, but denies any abdominal pain or heartburns. ? ?ED Course: Tachycardia no hypotension.  No hypoxia.  Afebrile.  CT head showed epidural fluid collection overlying left cerebral  hemisphere.  Neurosurgery reviewed CT head and recommended conservative management, repeat CAT scan in 24 hours. ? ?Hemoglobin 11.6 compared to 12.4 on last visit.  Guaiac test positive. ? ?Review of Systems: As per HPI otherwise 14 point review of systems negative.  ? ? ?Past Medical History:  ?Diagnosis Date  ? Atrial fibrillation ([place])   ? CAD (coronary artery disease)   ? CVA (cerebral vascular accident) ([place]) 05/27/2013  ? GI bleed 05/27/2013  ? HTN (hypertension)   ? Hx of CABG   ? Possibly 2015?  ? Hyperlipidemia   ? ? ?Past Surgical History:  ?Procedure Laterality Date  ? CORONARY ARTERY BYPASS GRAFT  1992  ? ESOPHAGOGASTRODUODENOSCOPY    ? TONSILLECTOMY AND ADENOIDECTOMY    ? ? ? reports that he quit smoking about 54 years ago. His smoking use included cigarettes. He started smoking about 66 years ago. He has a 24.00 pack-year smoking history. He has never used smokeless tobacco. He reports that he does not currently use alcohol. He reports that he does not use drugs. ? ?No Known Allergies ? ?Family History  ?Problem Relation Age of Onset  ? Vision loss Mother   ? Breast cancer Mother 34  ? Hypertension Father   ? Heart attack Sister 5  ? ? ?Prior to Admission medications   ?Medication Sig Start Date End Date Taking? Authorizing Provider  ?amoxicillin-clavulanate (AUGMENTIN) 875-125 MG tablet Take 1 tablet by mouth every 12 (twelve) hours. 04/15/21  Yes Lupita Dawn, MD  ?Adair Patter 100-25 MCG/ACT AEPB TAKE 1 PUFF BY MOUTH EVERY DAY ?Patient taking differently: Inhale 1 puff into the lungs daily. 03/28/21  Yes Loanne Drilling,  Darlis Loan, MD  ?calcium carbonate (CALCIUM 600) 600 MG TABS tablet Take 600 mg by mouth 2 (two) times daily with a meal.   Yes [provider]  ?Cholecalciferol (VITAMIN D) 125 MCG (5000 UT) CAPS Take 5,000 Units by mouth daily.   Yes [provider]  ?doxycycline (VIBRAMYCIN) 100 MG capsule Take 1 capsule (100 mg total) by mouth 2 (two) times daily. 04/15/21  Yes Lupita Dawn, MD  ?ENTRESTO 49-51 MG TAKE 1 TABLET BY MOUTH TWICE A DAY ?Patient taking differently: Take 1 tablet by mouth 2 (two) times daily. 04/10/21  Yes Tolia, Sunit, DO  ?ezetimibe (ZETIA) 10 MG tablet Take 10 mg by mouth daily.   Yes [provider]  ?famotidine (PEPCID) 20 MG tablet Take 20 mg by mouth 2 (two) times daily.   Yes [provider]  ?hydrALAZINE (APRESOLINE) 25 MG tablet TAKE 1 TABLET BY MOUTH THREE TIMES A DAY ?Patient taking differently: Take 25 mg by mouth 3 (three) times daily. 02/20/21  Yes Tolia, Sunit, DO  ?ibandronate (BONIVA) 150 MG tablet Take 150 mg by mouth every 30 (thirty) days. Saturday 03/14/21  Yes [provider]  ?isosorbide dinitrate (ISORDIL) 20 MG tablet TAKE 1 TABLET BY MOUTH THREE TIMES A DAY ?Patient taking differently: Take 20 mg by mouth 3 (three) times daily. 02/20/21  Yes Tolia, Sunit, DO  ?Misc Natural Products (MENS PROSTATE HEALTH FORMULA PO) Take 2 tablets by mouth daily.   Yes [provider]  ?Multiple Vitamin (MULTIVITAMIN) capsule Take 1 capsule by mouth daily.   Yes [provider]  ?omeprazole (PRILOSEC OTC) 20 MG tablet Take 20 mg by mouth daily.   Yes [provider]  ?Potassium 99 MG TABS Take 198 mg by mouth daily.   Yes [provider]  ?rivaroxaban (XARELTO) 20 MG TABS tablet Take 1 tablet (20 mg total) by mouth daily with supper. ?Patient taking differently: Take 15 mg by mouth daily with supper. 10/08/20 04/21/21 Yes Tolia, Sunit, DO  ?rosuvastatin (CRESTOR) 20 MG tablet Take 1 tablet (20 mg total) by mouth at bedtime for 90 doses. 01/30/21 04/30/21 Yes Tolia, Sunit, DO  ?tamsulosin (FLOMAX) 0.4 MG CAPS capsule Take 0.4 mg by mouth daily. 06/28/20  Yes [provider]  ?torsemide (DEMADEX) 5 MG tablet TAKE 1 TABLET BY MOUTH EVERY DAY IN THE MORNING ?Patient taking differently: Take 5 mg by mouth daily. 04/10/21  Yes Tolia, Sunit, DO  ? ? ?Physical Exam: ?Vitals:  ? 04/21/21 1830 04/21/21 1845  04/21/21 1900 04/21/21 1915  ?BP: (!) 152/75 (!) 146/75 (!) 141/67 133/72  ?Pulse: 81 83 77 71  ?Resp: (!) 30 (!) 28 (!) 24 (!) 23  ?Temp:      ?TempSrc:      ?SpO2: 93% 94% 96% 95%  ?Weight:      ?Height:      ? ? ?Constitutional: NAD, calm, comfortable ?Vitals:  ? 04/21/21 1830 04/21/21 1845 04/21/21 1900 04/21/21 1915  ?BP: (!) 152/75 (!) 146/75 (!) 141/67 133/72  ?Pulse: 81 83 77 71  ?Resp: (!) 30 (!) 28 (!) 24 (!) 23  ?Temp:      ?TempSrc:      ?SpO2: 93% 94% 96% 95%  ?Weight:      ?Height:      ? ?Eyes: PERRL, lids and conjunctivae normal ?ENMT: Mucous membranes are moist. Posterior pharynx clear of any exudate or lesions.Normal dentition.  ?Neck: normal, supple, no masses, no thyromegaly ?Respiratory: clear to auscultation bilaterally, no wheezing,  no crackles. Normal respiratory effort. No accessory muscle use.  ?Cardiovascular: Regular rate and rhythm, no murmurs / rubs / gallops. No extremity edema. 2+ pedal pulses. No carotid bruits.  ?Abdomen: no tenderness, no masses palpated. No hepatosplenomegaly. Bowel sounds positive.  ?Musculoskeletal: no clubbing / cyanosis. No joint deformity upper and lower extremities. Good ROM, no contractures. Normal muscle tone.  ?Skin: no rashes, lesions, ulcers. No induration ?Neurologic: CN 2-12 grossly intact. Sensation intact, DTR normal. Strength 5/5 in all 4.  ?Psychiatric: Normal judgment and insight. Alert and oriented x 3. Normal mood.  ? ? ? ?Labs on Admission: I have personally reviewed following labs and imaging studies ? ?CBC: ?Recent Labs  ?Lab 04/15/21 ?1743 04/21/21 ?1527  ?WBC 16.2* 9.9  ?NEUTROABS 13.5*  --   ?HGB 12.3* 11.6*  ?HCT 36.8* 34.3*  ?MCV 94.6 92.2  ?PLT 203 265  ? ?Basic Metabolic Panel: ?Recent Labs  ?Lab 04/15/21 ?1743 04/21/21 ?1527  ?NA 133* 131*  ?K 4.0 3.8  ?CL 101 96*  ?CO2 22 28  ?GLUCOSE 132* 90  ?BUN 23 20  ?CREATININE 0.95 0.88  ?CALCIUM 8.9 8.7*  ? ?GFR: ?Estimated Creatinine Clearance: 65.4 mL/min (by C-G formula based on SCr of  0.88 mg/dL). ?Liver Function Tests: ?Recent Labs  ?Lab 04/15/21 ?1743 04/21/21 ?1450  ?AST 21 31  ?ALT 14 45*  ?ALKPHOS 49 47  ?BILITOT 0.5 0.8  ?PROT 6.8 6.4*  ?ALBUMIN 3.3* 3.0*  ? ?No results for input(s): LIPASE, A

## 2021-04-21 NOTE — ED Triage Notes (Signed)
The pt fell outside at home with loc  he is on a blood thinner  he was seen here in the ed for the same  for the past 2-3 days he has had blurred vision   light sensitivity wetting the bed at night   and had dark colored stools.  Hx of constipation chronically ?

## 2021-04-21 NOTE — ED Triage Notes (Signed)
The pt is a and o x 4  he already has a diagnosis of pneumonia  hx of gi bleeds and a stroke in the past ?

## 2021-04-21 NOTE — ED Notes (Signed)
Patient transported to CT 

## 2021-04-21 NOTE — ED Provider Notes (Signed)
?[facility] ?Provider Note ? ? ?CSN: [id number] ?Arrival date & time: 04/21/21  1450 ? ?  ? ?History ? ?Chief Complaint  ?Patient presents with  ? Generalized Body Aches  ? ? ?[facility] is a 88 y.o. male  w/ Hypertension, hyperlipidemia, CVA in 2015 on Xarelto, persistent atrial fibrillation, CAD status post CABG in 1992 (LIMA to the LAD and SVG to OM) at age of 62, HFpEF, history of GI bleed secondary to AVM in 2015, aortic atherosclerosis, former smoker, idiopathic pulmonary fibrosis presented to the ED for intermittent episodes of altered mentation and melena.  Wife is reported that he seems just more weak and tired.  Daughter and wife report intermittent episodes of hallucinations, and wetting the bed at night.  They report he seemed more tired and less like himself with slowed/slurred speech intermittently. ? ?Patient was recently seen in the ED for an unwitnessed fall where he was diagnosed with a hematoma no intracranial bleed.  Patient also had a new cough with sputum production and after shared decision-making was discharged home on Augmentin and Doxy after dose of Unasyn. ? ?HPI ? ?  ? ?Home Medications ?Prior to Admission medications   ?Medication Sig Start Date End Date Taking? Authorizing Provider  ?amoxicillin-clavulanate (AUGMENTIN) 875-125 MG tablet Take 1 tablet by mouth every 12 (twelve) hours. 04/15/21  Yes Micheline Maze, MD  ?Earlie Server 100-25 MCG/ACT AEPB TAKE 1 PUFF BY MOUTH EVERY DAY ?Patient taking differently: Inhale 1 puff into the lungs daily. 03/28/21  Yes Luciano Cutter, MD  ?calcium carbonate (CALCIUM 600) 600 MG TABS tablet Take 600 mg by mouth 2 (two) times daily with a meal.   Yes [provider]  ?Cholecalciferol (VITAMIN D) 125 MCG (5000 UT) CAPS Take 5,000 Units by mouth daily.   Yes [provider]  ?doxycycline (VIBRAMYCIN) 100 MG capsule Take 1 capsule (100 mg total) by mouth 2 (two) times daily. 04/15/21  Yes Micheline Maze, MD  ?ENTRESTO 49-51 MG TAKE 1 TABLET BY MOUTH TWICE A DAY ?Patient taking differently: Take 1 tablet by mouth 2 (two) times daily. 04/10/21  Yes Tolia, Sunit, DO  ?ezetimibe (ZETIA) 10 MG tablet Take 10 mg by mouth daily.   Yes [provider]  ?famotidine (PEPCID) 20 MG tablet Take 20 mg by mouth 2 (two) times daily.   Yes [provider]  ?hydrALAZINE (APRESOLINE) 25 MG tablet TAKE 1 TABLET BY MOUTH THREE TIMES A DAY ?Patient taking differently: Take 25 mg by mouth 3 (three) times daily. 02/20/21  Yes Tolia, Sunit, DO  ?ibandronate (BONIVA) 150 MG tablet Take 150 mg by mouth every 30 (thirty) days. Saturday 03/14/21  Yes [provider]  ?isosorbide dinitrate (ISORDIL) 20 MG tablet TAKE 1 TABLET BY MOUTH THREE TIMES A DAY ?Patient taking differently: Take 20 mg by mouth 3 (three) times daily. 02/20/21  Yes Tolia, Sunit, DO  ?Misc Natural Products (MENS PROSTATE HEALTH FORMULA PO) Take 2 tablets by mouth daily.   Yes [provider]  ?Multiple Vitamin (MULTIVITAMIN) capsule Take 1 capsule by mouth daily.   Yes [provider]  ?omeprazole (PRILOSEC OTC) 20 MG tablet Take 20 mg by mouth daily.   Yes [provider]  ?Potassium 99 MG TABS Take 198 mg by mouth daily.   Yes [provider]  ?rivaroxaban (XARELTO) 20 MG TABS tablet Take 1 tablet (20 mg total) by mouth daily with supper. ?Patient taking differently: Take 15 mg by mouth daily  with supper. 10/08/20 04/21/21 Yes Tolia, Sunit, DO  ?rosuvastatin (CRESTOR) 20 MG tablet Take 1 tablet (20 mg total) by mouth at bedtime for 90 doses. 01/30/21 04/30/21 Yes Tolia, Sunit, DO  ?tamsulosin (FLOMAX) 0.4 MG CAPS capsule Take 0.4 mg by mouth daily. 06/28/20  Yes [provider]  ?torsemide (DEMADEX) 5 MG tablet TAKE 1 TABLET BY MOUTH EVERY DAY IN THE MORNING ?Patient taking differently: Take 5 mg by mouth daily. 04/10/21  Yes Tolia, Sunit, DO  ?   ? ?Allergies    ?Patient has no known allergies.   ? ?Review  of Systems   ?Review of Systems  ?Constitutional:  Negative for chills and fever.  ?Respiratory:  Positive for cough. Negative for chest tightness and shortness of breath.   ?Cardiovascular:  Negative for chest pain and palpitations.  ?Gastrointestinal:  Positive for blood in stool. Negative for abdominal distention, abdominal pain, nausea and vomiting.  ?Skin:  Negative for rash and wound.  ? ?Physical Exam ?Updated Vital Signs ?BP 133/72   Pulse 71   Temp 98.1 ?F (36.7 ?C) (Oral)   Resp (!) 23   Ht 5\' 11"  (1.803 m)   Wt 83.9 kg   SpO2 95%   BMI 25.80 kg/m?  ?Physical Exam ?Constitutional:   ?   Appearance: He is not ill-appearing.  ?Eyes:  ?   Extraocular Movements: Extraocular movements intact.  ?   Pupils: Pupils are equal, round, and reactive to light.  ?Cardiovascular:  ?   Rate and Rhythm: Normal rate. Rhythm irregular.  ?Pulmonary:  ?   Effort: No respiratory distress.  ?   Breath sounds: No wheezing.  ?   Comments: Tachypneic ?Neurological:  ?   Mental Status: He is alert.  ?Psychiatric:     ?   Mood and Affect: Mood normal.     ?   Behavior: Behavior normal.  ? ? ?MENTAL STATUS EXAM:    ?Orientation: Alert and oriented to person, place and time.  ?Memory: Cooperative, follows commands well.  ?Language: Speech is clear and language is normal.  ? ?CRANIAL NERVES:    ?CN 2 (Optic): Visual fields intact to confrontation.  ?CN 3,4,6 (EOM): Pupils equal and reactive to light. Full extraocular eye movement without nystagmus.  ?CN 5 (Trigeminal): Facial sensation is normal, no weakness of masticatory muscles.  ?CN 7 (Facial): No facial weakness or asymmetry.  ?CN 8 (Auditory): Auditory acuity grossly normal.  ?CN 9,10 (Glossophar): The uvula is midline, the palate elevates symmetrically.  ?CN 11 (spinal access): Normal sternocleidomastoid and trapezius strength.  ?CN 12 (Hypoglossal): The tongue is midline. No atrophy or fasciculations..  ? ?MOTOR:  Muscle Strength: 5/5 in RUE, LUE, RLE, LLE ? ?REFLEXES: No  clonus.  ? ?COORDINATION: Dysmetria worse on the right versus left on finger-nose. no pronator drift.  ? ?SENSATION:   Intact to light touch all four extremities. ? ?GAIT: Gait normal without ataxia ? ? ?ED Results / Procedures / Treatments   ?Labs ?(all labs ordered are listed, but only abnormal results are displayed) ?Labs Reviewed  ?BASIC METABOLIC PANEL - Abnormal; Notable for the following components:  ?    Result Value  ? Sodium 131 (*)   ? Chloride 96 (*)   ? Calcium 8.7 (*)   ? All other components within normal limits  ?CBC - Abnormal; Notable for the following components:  ? RBC 3.72 (*)   ? Hemoglobin 11.6 (*)   ? HCT 34.3 (*)   ? All other components  within normal limits  ?HEPATIC FUNCTION PANEL - Abnormal; Notable for the following components:  ? Total Protein 6.4 (*)   ? Albumin 3.0 (*)   ? ALT 45 (*)   ? All other components within normal limits  ?POC OCCULT BLOOD, ED - Abnormal; Notable for the following components:  ? Fecal Occult Bld POSITIVE (*)   ? All other components within normal limits  ?TROPONIN I (HIGH SENSITIVITY) - Abnormal; Notable for the following components:  ? Troponin I (High Sensitivity) 21 (*)   ? All other components within normal limits  ?RESP PANEL BY RT-PCR (FLU A&B, COVID) [id number]  ?CULTURE, BLOOD (ROUTINE X 2)  ?CULTURE, BLOOD (ROUTINE X 2)  ?LACTIC ACID, PLASMA  ?LACTIC ACID, PLASMA  ?HEMOGLOBIN AND HEMATOCRIT, BLOOD  ?BASIC METABOLIC PANEL  ?CBC  ?TROPONIN I (HIGH SENSITIVITY)  ? ? ?EKG ?EKG Interpretation ? ?Date/Time:  Sunday April 21 2021 15:13:51 EDT ?Ventricular Rate:  70 ?PR Interval:    ?QRS Duration: 98 ?QT Interval:  380 ?QTC Calculation: 410 ?R Axis:   17 ?Text Interpretation: Atrial fibrillation with premature ventricular or aberrantly conducted complexes Abnormal ECG When compared with ECG of 15-Apr-2021 17:10, PREVIOUS ECG IS PRESENT No significant change since last tracing Confirmed by Jacalyn LefevreHaviland, Julie [id number]) on 04/21/2021 3:35:17 PM ? ?Radiology ?DG Chest 2  View ? ?Result Date: 04/21/2021 ?CLINICAL DATA:  Dizziness, slurred speech for 3 days EXAM: CHEST - 2 VIEW COMPARISON:  04/15/2021 FINDINGS: Frontal and lateral views of the chest demonstrate an unremarkable car

## 2021-04-22 ENCOUNTER — Inpatient Hospital Stay (HOSPITAL_COMMUNITY): Payer: Medicare Other

## 2021-04-22 DIAGNOSIS — K922 Gastrointestinal hemorrhage, unspecified: Secondary | ICD-10-CM | POA: Diagnosis not present

## 2021-04-22 LAB — CBC
HCT: 32.4 % — ABNORMAL LOW (ref 39.0–52.0)
Hemoglobin: 11.1 g/dL — ABNORMAL LOW (ref 13.0–17.0)
MCH: 30.7 pg (ref 26.0–34.0)
MCHC: 34.3 g/dL (ref 30.0–36.0)
MCV: 89.8 fL (ref 80.0–100.0)
Platelets: 240 10*3/uL (ref 150–400)
RBC: 3.61 MIL/uL — ABNORMAL LOW (ref 4.22–5.81)
RDW: 12.5 % (ref 11.5–15.5)
WBC: 10.1 10*3/uL (ref 4.0–10.5)
nRBC: 0 % (ref 0.0–0.2)

## 2021-04-22 LAB — BASIC METABOLIC PANEL
Anion gap: 5 (ref 5–15)
BUN: 17 mg/dL (ref 8–23)
CO2: 26 mmol/L (ref 22–32)
Calcium: 8.5 mg/dL — ABNORMAL LOW (ref 8.9–10.3)
Chloride: 100 mmol/L (ref 98–111)
Creatinine, Ser: 0.77 mg/dL (ref 0.61–1.24)
GFR, Estimated: >60 mL/min (ref >60)
Glucose, Bld: 106 mg/dL — ABNORMAL HIGH (ref 70–99)
Potassium: 3.6 mmol/L (ref 3.5–5.1)
Sodium: 131 mmol/L — ABNORMAL LOW (ref 135–145)

## 2021-04-22 MED ORDER — TAMSULOSIN HCL 0.4 MG PO CAPS
0.8000 mg | ORAL_CAPSULE | Freq: Every day | ORAL | Status: DC
  Administered 2021-04-23 – 2021-04-24 (×2): 0.8 mg via ORAL
  Filled 2021-04-22 (×3): qty 2

## 2021-04-22 NOTE — Consult Note (Signed)
Reason for Consult:SDH ?Referring Physician: EDP ? ?[facility] is an 88 y.o. male.  ? ?HPI:  ?88 year old male presented to the ED last night with dark stools and unsteady gait.Marland Kitchen He states that he hit his head on Monday. Denies any LOC.  He has a history of A-fib and is on Xarelto.  Also has a history of stroke, CAD, CABG, and GI bleed.  He did have COVID 3 weeks ago.  States that he was having very vivid nightmares.  He denies any headaches nausea or vomiting. ? ?Past Medical History:  ?Diagnosis Date  ? Atrial fibrillation ([place])   ? CAD (coronary artery disease)   ? CVA (cerebral vascular accident) ([place]) 05/27/2013  ? GI bleed 05/27/2013  ? HTN (hypertension)   ? Hx of CABG   ? Possibly 2015?  ? Hyperlipidemia   ?  ?Past Surgical History:  ?Procedure Laterality Date  ? CORONARY ARTERY BYPASS GRAFT  1992  ? ESOPHAGOGASTRODUODENOSCOPY    ? TONSILLECTOMY AND ADENOIDECTOMY    ?  ?No Known Allergies  ?Social History  ? ?Tobacco Use  ? Smoking status: Former  ?  Packs/day: 2.00  ?  Years: 12.00  ?  Pack years: 24.00  ?  Types: Cigarettes  ?  Start date: 01/28/1955  ?  Quit date: 01/28/1967  ?  Years since quitting: 54.2  ? Smokeless tobacco: Never  ?Substance Use Topics  ? Alcohol use: Not Currently  ?  ?Family History  ?Problem Relation Age of Onset  ? Vision loss Mother   ? Breast cancer Mother 47  ? Hypertension Father   ? Heart attack Sister 71  ? ?  ?Review of Systems ? ?Positive ROS: As above ? ?All other systems have been reviewed and were otherwise negative with the exception of those mentioned in the HPI and as above. ? ?Objective: ?Vital signs in last 24 hours: ?Temp:  [97.6 ?F (36.4 ?C)-98.1 ?F (36.7 ?C)] 97.6 ?F (36.4 ?C) (03/27 [id number]) ?Pulse Rate:  [66-91] 83 (03/27 0923) ?Resp:  [15-30] 18 (03/27 [id number]) ?BP: (113-168)/(67-98) 155/85 (03/27 0816) ?SpO2:  [92 %-96 %] 93 % (03/27 0923) ?Weight:  [83.9 kg] 83.9 kg (03/26 1515) ? ?General Appearance: Alert, cooperative, no distress, appears stated age ?Head:  Normocephalic, without obvious abnormality, atraumatic ?Eyes: PERRL, conjunctiva/corneas clear, EOM's intact, fundi benign, both eyes      ?Lungs: respirations unlabored ?Heart: Regular rate and rhythm ? ?NEUROLOGIC:  ? ?Mental status: A&O x4, no aphasia, good attention span, Memory and fund of knowledge ?Motor Exam - grossly normal, normal tone and bulk ?Sensory Exam - grossly normal ?Reflexes: symmetric, no pathologic reflexes, No Hoffman's, No clonus ?Coordination - grossly normal ?Gait -not tested ?Balance -not tested ?Cranial Nerves: ?I: smell Not tested  ?II: visual acuity  OS: na    OD: na  ?II: visual fields Full to confrontation  ?II: pupils Equal, round, reactive to light  ?III,VII: ptosis None  ?III,IV,VI: extraocular muscles  Full ROM  ?V: mastication Normal  ?V: facial light touch sensation  Normal  ?V,VII: corneal reflex  Present  ?VII: facial muscle function - upper  Normal  ?VII: facial muscle function - lower Normal  ?VIII: hearing Not tested  ?IX: soft palate elevation  Normal  ?IX,X: gag reflex Present  ?XI: trapezius strength  5/5  ?XI: sternocleidomastoid strength 5/5  ?XI: neck flexion strength  5/5  ?XII: tongue strength  Normal  ? ? ?Data Review ?Lab Results  ?Component Value Date  ?  WBC 10.1 04/22/2021  ? HGB 11.1 (L) 04/22/2021  ? HCT 32.4 (L) 04/22/2021  ? MCV 89.8 04/22/2021  ? PLT 240 04/22/2021  ? ?Lab Results  ?Component Value Date  ? NA 131 (L) 04/22/2021  ? K 3.6 04/22/2021  ? CL 100 04/22/2021  ? CO2 26 04/22/2021  ? BUN 17 04/22/2021  ? CREATININE 0.77 04/22/2021  ? GLUCOSE 106 (H) 04/22/2021  ? ?Lab Results  ?Component Value Date  ? INR 2.2 (H) 04/15/2021  ? ? ?Radiology: DG Chest 2 View ? ?Result Date: 04/21/2021 ?CLINICAL DATA:  Dizziness, slurred speech for 3 days EXAM: CHEST - 2 VIEW COMPARISON:  04/15/2021 FINDINGS: Frontal and lateral views of the chest demonstrate an unremarkable cardiac silhouette. Postsurgical changes from median sternotomy. There is continued right upper  lobe airspace disease abutting the minor fissure, with slight improvement since prior study. There has been interval development of patchy bibasilar airspace disease, right greater than left. No effusion or pneumothorax. No acute bony abnormalities. IMPRESSION: 1. Multifocal bilateral airspace disease as above, consistent with pneumonia. Overall, slight progression since prior study. Electronically Signed   By: Randa Ngo M.D.   On: 04/21/2021 16:23  ? ?CT HEAD WO CONTRAST (5MM) ? ?Result Date: 04/22/2021 ?CLINICAL DATA:  Epidural hematoma EXAM: CT HEAD WITHOUT CONTRAST TECHNIQUE: Contiguous axial images were obtained from the base of the skull through the vertex without intravenous contrast. RADIATION DOSE REDUCTION: This exam was performed according to the departmental dose-optimization program which includes automated exposure control, adjustment of the mA and/or kV according to patient size and/or use of iterative reconstruction technique. COMPARISON:  CT head April 15, 2021. FINDINGS: Brain: Unchanged low to intermediate density subdural collection along the left convexity, measuring up to 7 mm in thickness. No midline shift. No evidence of interval hemorrhage. Similar right frontal encephalomalacia. No evidence of acute large vascular territory infarct. No hydrocephalus. No mass lesion. Partially empty sella. Vascular: No hyperdense vessel identified. Calcific intracranial atherosclerosis. Skull: No acute fracture.  Right posterior scalp hematoma. Sinuses/Orbits: Clear sinuses.  No acute orbital findings Other: No mastoid effusions. IMPRESSION: 1. Unchanged low to intermediate density subdural collection along the left convexity, measuring up to 7 mm in thickness and probably a hematoma. No midline shift. 2. Right posterior scalp hematoma. Electronically Signed   By: Margaretha Sheffield M.D.   On: 04/22/2021 08:55  ? ?CT Head Wo Contrast ? ?Result Date: 04/21/2021 ?CLINICAL DATA:  Head trauma. EXAM: CT HEAD  WITHOUT CONTRAST TECHNIQUE: Contiguous axial images were obtained from the base of the skull through the vertex without intravenous contrast. RADIATION DOSE REDUCTION: This exam was performed according to the departmental dose-optimization program which includes automated exposure control, adjustment of the mA and/or kV according to patient size and/or use of iterative reconstruction technique. COMPARISON:  CT head dated 04/15/2021. FINDINGS: Brain: An epidural fluid collection overlying the left cerebral hemisphere measures up to 8 mm in greatest thickness. This appears new since 04/15/2021, however, the density of fluid is lower than expected for acute blood and is favored to reflect subacute blood products. This exerts regional mass effect. There is no significant midline shift. There is mild cerebral volume loss with associated ex vacuo dilatation. The basilar cisterns are patent. Encephalomalacia of the right frontal lobe is unchanged. Vascular: There are vascular calcifications in the carotid siphons. Skull: Normal. Negative for fracture or focal lesion. Sinuses/Orbits: There is mild right ethmoid sinus disease. Other: A scalp hematoma overlying the posterior right parietal bone has  decreased in size. IMPRESSION: Interval development of an epidural fluid collection overlying the left cerebral hemisphere, likely reflecting subacute blood products. No internal brain herniation. These results were called by telephone at the time of interpretation on 04/21/2021 at 5:09 pm to provider [facility] , who verbally acknowledged these results. Electronically Signed   By: Zerita Boers M.D.   On: 04/21/2021 17:09  ? ? ? ?Assessment/Plan: ?88 year old patient came in last night with dark stools and unsteady gait.  CT head showed a small left-sided chronic subdural hematoma with no mass effect or midline shift.  He does have a significant amount of atrophy.  CT head this morning was unchanged.  No neurosurgical  intervention is warranted at this time.  Would stop Xarelto.  Continue neurochecks.  Please call with any neurological changes. ? ? ?Ocie Cornfield Darnette Lampron ?04/22/2021 12:18 PM ? ?  ?

## 2021-04-22 NOTE — Progress Notes (Signed)
[facility] notified c/o audio hallucination  Nero assesment mild slurred speech VS charted in Epic WNL   ?

## 2021-04-22 NOTE — Consult Note (Incomplete)
Reason for Consult:*** ?Referring Physician: *** ? ?Calvin Brown is an 88 y.o. male.  ?HPI: *** ? ?Past Medical History:  ?Diagnosis Date  ? Atrial fibrillation (HCC)   ? CAD (coronary artery disease)   ? CVA (cerebral vascular accident) (HCC) 05/27/2013  ? GI bleed 05/27/2013  ? HTN (hypertension)   ? Hx of CABG   ? Possibly 2015?  ? Hyperlipidemia   ? ? ?Past Surgical History:  ?Procedure Laterality Date  ? CORONARY ARTERY BYPASS GRAFT  1992  ? ESOPHAGOGASTRODUODENOSCOPY    ? TONSILLECTOMY AND ADENOIDECTOMY    ? ? ?Family History  ?Problem Relation Age of Onset  ? Vision loss Mother   ? Breast cancer Mother 71  ? Hypertension Father   ? Heart attack Sister 30  ? ? ?Social History:  reports that he quit smoking about 54 years ago. His smoking use included cigarettes. He started smoking about 66 years ago. He has a 24.00 pack-year smoking history. He has never used smokeless tobacco. He reports that he does not currently use alcohol. He reports that he does not use drugs. ? ?Allergies: No Known Allergies ? ?Medications: {medication reviewed/display:3041432} ? ?Results for orders placed or performed during the hospital encounter of 04/21/21 (from the past 48 hour(s))  ?Hepatic function panel     Status: Abnormal  ? Collection Time: 04/21/21  2:50 PM  ?Result Value Ref Range  ? Total Protein 6.4 (L) 6.5 - 8.1 g/dL  ? Albumin 3.0 (L) 3.5 - 5.0 g/dL  ? AST 31 15 - 41 U/L  ? ALT 45 (H) 0 - 44 U/L  ? Alkaline Phosphatase 47 38 - 126 U/L  ? Total Bilirubin 0.8 0.3 - 1.2 mg/dL  ? Bilirubin, Direct 0.1 0.0 - 0.2 mg/dL  ? Indirect Bilirubin 0.7 0.3 - 0.9 mg/dL  ?  Comment: Performed at [facility], [address] [place], [place] [address]  ?Lactic acid, plasma     Status: None  ? Collection Time: 04/21/21  2:50 PM  ?Result Value Ref Range  ? Lactic Acid, Venous 1.1 0.5 - 1.9 mmol/L  ?  Comment: Performed at [facility], [address], [place], [place] [address]  ?Basic metabolic panel     Status: Abnormal  ?  Collection Time: 04/21/21  3:27 PM  ?Result Value Ref Range  ? Sodium 131 (L) 135 - 145 mmol/L  ? Potassium 3.8 3.5 - 5.1 mmol/L  ? Chloride 96 (L) 98 - 111 mmol/L  ? CO2 28 22 - 32 mmol/L  ? Glucose, Bld 90 70 - 99 mg/dL  ?  Comment: Glucose reference range applies only to samples taken after fasting for at least 8 hours.  ? BUN 20 8 - 23 mg/dL  ? Creatinine, Ser 0.88 0.61 - 1.24 mg/dL  ? Calcium 8.7 (L) 8.9 - 10.3 mg/dL  ? GFR, Estimated >60 >60 mL/min  ?  Comment: (NOTE) ?Calculated using the CKD-EPI Creatinine Equation (2021) ?  ? Anion gap 7 5 - 15  ?  Comment: Performed at [facility], [address], [place], [place] [address]  ?CBC     Status: Abnormal  ? Collection Time: 04/21/21  3:27 PM  ?Result Value Ref Range  ? WBC 9.9 4.0 - 10.5 K/uL  ? RBC 3.72 (L) 4.22 - 5.81 MIL/uL  ? Hemoglobin 11.6 (L) 13.0 - 17.0 g/dL  ? HCT 34.3 (L) 39.0 - 52.0 %  ? MCV 92.2 80.0 - 100.0 fL  ?  MCH 31.2 26.0 - 34.0 pg  ? MCHC 33.8 30.0 - 36.0 g/dL  ? RDW 12.6 11.5 - 15.5 %  ? Platelets 265 150 - 400 K/uL  ? nRBC 0.0 0.0 - 0.2 %  ?  Comment: Performed at [facility], [address] [place], [place] [address]  ?Troponin I (High Sensitivity)     Status: Abnormal  ? Collection Time: 04/21/21  3:27 PM  ?Result Value Ref Range  ? Troponin I (High Sensitivity) 21 (H) <18 ng/L  ?  Comment: (NOTE) ?Elevated high sensitivity troponin I (hsTnI) values and significant  ?changes across serial measurements may suggest ACS but many other  ?chronic and acute conditions are known to elevate hsTnI results.  ?Refer to the "Links" section for chest pain algorithms and additional  ?guidance. ?Performed at [facility], [address] [place], [place] ?[address] ?  ?POC occult blood, ED Provider will collect     Status: Abnormal  ? Collection Time: 04/21/21  4:17 PM  ?Result Value Ref Range  ? Fecal Occult Bld POSITIVE (A) NEGATIVE  ?Culture, blood (routine x 2)     Status: None (Preliminary result)  ? Collection Time: 04/21/21   4:57 PM  ? Specimen: BLOOD RIGHT HAND  ?Result Value Ref Range  ? Specimen Description BLOOD RIGHT HAND   ? Special Requests    ?  BOTTLES DRAWN AEROBIC AND ANAEROBIC Blood Culture results may not be optimal due to an inadequate volume of blood received in culture bottles  ? Culture    ?  NO GROWTH < 24 HOURS ?Performed at [facility], [address], [place], [place] [address] ?  ? Report Status PENDING   ?Culture, blood (routine x 2)     Status: None (Preliminary result)  ? Collection Time: 04/21/21  5:37 PM  ? Specimen: BLOOD  ?Result Value Ref Range  ? Specimen Description BLOOD RIGHT ANTECUBITAL   ? Special Requests    ?  BOTTLES DRAWN AEROBIC AND ANAEROBIC Blood Culture adequate volume  ? Culture    ?  NO GROWTH < 24 HOURS ?Performed at [facility], [address] [place], [place] [address] ?  ? Report Status PENDING   ?Resp Panel by RT-PCR (Flu A&B, Covid) Nasopharyngeal Swab     Status: None  ? Collection Time: 04/21/21  6:24 PM  ? Specimen: Nasopharyngeal Swab; Nasopharyngeal(NP) swabs in vial transport medium  ?Result Value Ref Range  ? SARS Coronavirus 2 by RT PCR NEGATIVE NEGATIVE  ?  Comment: (NOTE) ?SARS-CoV-2 target nucleic acids are NOT DETECTED. ? ?The SARS-CoV-2 RNA is generally detectable in upper respiratory ?specimens during the acute phase of infection. The lowest ?concentration of SARS-CoV-2 viral copies this assay can detect is ?138 copies/mL. A negative result does not preclude SARS-Cov-2 ?infection and should not be used as the sole basis for treatment or ?other patient management decisions. A negative result may occur with  ?improper specimen collection/handling, submission of specimen other ?than nasopharyngeal swab, presence of viral mutation(s) within the ?areas targeted by this assay, and inadequate number of viral ?copies(<138 copies/mL). A negative result must be combined with ?clinical observations, patient history, and epidemiological ?information. The expected  result is Negative. ? ?Fact Sheet for Patients:  ?BloggerCourse.com ? ?Fact Sheet for Healthcare Providers:  ?SeriousBroker.it ? ?This test is no t yet approved or cleared by the [place] FDA and  ?has been authorized for detection and/or diagnosis of SARS-CoV-2 by ?FDA  under an Emergency Use Authorization (EUA). This EUA will remain  ?in effect (meaning this test can be used) for the duration of the ?COVID-19 declaration under Section 564(b)(1) of the Act, 21 ?U.S.C.section 360bbb-3(b)(1), unless the authorization is terminated  ?or revoked sooner.  ? ? ?  ? Influenza A by PCR NEGATIVE NEGATIVE  ? Influenza B by PCR NEGATIVE NEGATIVE  ?  Comment: (NOTE) ?The Xpert Xpress SARS-CoV-2/FLU/RSV plus assay is intended as an aid ?in the diagnosis of influenza from Nasopharyngeal swab specimens and ?should not be used as a sole basis for treatment. Nasal washings and ?aspirates are unacceptable for Xpert Xpress SARS-CoV-2/FLU/RSV ?testing. ? ?Fact Sheet for Patients: ?BloggerCourse.comhttps://www.fda.gov/media/152166/download ? ?Fact Sheet for Healthcare Providers: ?SeriousBroker.ithttps://www.fda.gov/media/152162/download ? ?This test is not yet approved or cleared by the [place] FDA and ?has been authorized for detection and/or diagnosis of SARS-CoV-2 by ?FDA under an Emergency Use Authorization (EUA). This EUA will remain ?in effect (meaning this test can be used) for the duration of the ?COVID-19 declaration under Section 564(b)(1) of the Act, 21 U.S.C. ?section 360bbb-3(b)(1), unless the authorization is terminated or ?revoked. ? ?Performed at [facility], [address] [place], [place] ?[address] ?  ?Lactic acid, plasma     Status: None  ? Collection Time: 04/21/21 10:03 PM  ?Result Value Ref Range  ? Lactic Acid, Venous 1.0 0.5 - 1.9 mmol/L  ?  Comment: Performed at [facility], [address] [place], [place] [address]  ?Troponin I (High Sensitivity)     Status: Abnormal   ? Collection Time: 04/21/21 10:03 PM  ?Result Value Ref Range  ? Troponin I (High Sensitivity) 18 (H) <18 ng/L  ?  Comment: (NOTE) ?Elevated high sensitivity troponin I (hsTnI) values and significant  ?ch

## 2021-04-22 NOTE — Evaluation (Signed)
Clinical/Bedside Swallow Evaluation ?Patient Details  ?Name: Calvin Brown ?MRN: [id number] ?Date of Birth: March 05, 1933 ? ?Today's Date: 04/22/2021 ?Time: SLP Start Time (ACUTE ONLY): [id number] SLP Stop Time (ACUTE ONLY): [id number] ?SLP Time Calculation (min) (ACUTE ONLY): 13 min ? ?Past Medical History:  ?Past Medical History:  ?Diagnosis Date  ? Atrial fibrillation ([place])   ? CAD (coronary artery disease)   ? CVA (cerebral vascular accident) ([place]) 05/27/2013  ? GI bleed 05/27/2013  ? HTN (hypertension)   ? Hx of CABG   ? Possibly 2015?  ? Hyperlipidemia   ? ?Past Surgical History:  ?Past Surgical History:  ?Procedure Laterality Date  ? CORONARY ARTERY BYPASS GRAFT  1992  ? ESOPHAGOGASTRODUODENOSCOPY    ? TONSILLECTOMY AND ADENOIDECTOMY    ? 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 Dx lower GI bleed, subacute epidural hematoma s/p fall six days ago. CT head:  small left-sided chronic subdural hematoma with no mass effect or midline shift; significant amount of atrophy.  Medical history significant for A-fib on Xarelto, CVA with chronic ambulation dysfunction, CAD status post CABG, GI bleed secondary to AVM status post cauterization, chronic diastolic CHF, pulmonary fibrosis, HTN,  ?  ?Assessment / Plan / Recommendation  ?Clinical Impression ? Pt participated in clinical swallowing assessment. Oral mechanism exam was normal; no focal CN deficits. Pt demonstrated thorough mastication of pureed solids, the appearance of a swift swallow response, and no s/s of aspiration.  No dysphagia identified. Recommend advancing diet to regular solids/thin liquids when cleared by GI. No SLP f/u is needed. ?SLP Visit Diagnosis: Dysphagia, unspecified (R13.10) ?   ?Aspiration Risk ? No limitations  ?  ?Diet Recommendation When medically ready, he may advance to regular solids/thin liquids ? ?Medication Administration: Whole meds with liquid  ?  ?Other  Recommendations     ? ?Recommendations  for follow up therapy are one component of a multi-disciplinary discharge planning process, led by the attending physician.  Recommendations may be updated based on patient status, additional functional criteria and insurance authorization. ? ?Follow up Recommendations No SLP follow up  ? ? ?  ? ? 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 Dx lower GI bleed, subacute epidural hematoma s/p fall six days ago. CT head:  small left-sided chronic subdural hematoma with no mass effect or midline shift; significant amount of atrophy.  Medical history significant for A-fib on Xarelto, CVA with chronic ambulation dysfunction, CAD status post CABG, GI bleed secondary to AVM status post cauterization, chronic diastolic CHF, pulmonary fibrosis, HTN, ?Type of Study: Bedside Swallow Evaluation ?Previous Swallow Assessment: no ?Diet Prior to this Study: Other (Comment) (full liquids) ?Temperature Spikes Noted: No ?Respiratory Status: Room air ?Behavior/Cognition: Alert;Cooperative;Pleasant mood ?Oral Cavity Assessment: Within Functional Limits ?Oral Care Completed by SLP: No ?Oral Cavity - Dentition: Adequate natural dentition ?Vision: Functional for self-feeding ?Self-Feeding Abilities: Able to feed self ?Patient Positioning: Upright in bed ?Baseline Vocal Quality: Normal ?Volitional Cough: Strong ?Volitional Swallow: Able to elicit  ?  ?Oral/Motor/Sensory Function Overall Oral Motor/Sensory Function: Within functional limits   ?Ice Chips Ice chips: Within functional limits   ?Thin Liquid Thin Liquid: Within functional limits  ?  ?Nectar Thick Nectar Thick Liquid: Not tested   ?Honey Thick Honey Thick Liquid: Not tested   ?Puree Puree: Within functional limits   ?Solid ? ? ?  Solid: Not tested  ? ?  ? ?Juan Quam Laurice ?04/22/2021,2:35 PM ? ?Ardis Fullwood L. Kilah Drahos, MA CCC/SLP ?Acute Rehabilitation Services ?Office number [phone] ?Pager  [phone] ? ? ?

## 2021-04-22 NOTE — Progress Notes (Signed)
?PROGRESS NOTE ? ?Calvin Brown  ?DOB: 1933/09/10  ?PCP: Sueanne Margarita, DO ?[id number]  ?DOA: 04/21/2021 ? LOS: 1 day  ?Hospital Day: 2 ? ?Brief narrative: ?Calvin Brown is a 88 y.o. male with PMH significant for stroke with ambulatory function, A-fib on Xarelto, CAD/CABG, GI bleed secondary to gastric AVM s/p cauterization [id number], chronic diastolic CHF, pulmonary fibrosis, HTN, chronic constipation. ?Patient was brought to ED from home with melena and unsteady gait. ?Patient had COVID 3 weeks ago and was treated with 4 days of antibiotic treatment.  Last week, patient had an episode of fall [place] and hit his head.  He did not pass out or feel any weakness or numbness at the time.  He was seen in the ED.  CT head was negative.   ?X-ray showed right-sided pneumonia and hence patient was started on a course of Augmentin and doxycycline.  His weakness, cough improved but over the course of next few days, but patient started having nightmares, auditory and visual hallucinations.  Family also noted slurred speech, sensitivity to light and urinary incontinence. ?Family also noted him unsteady.   ?3/25, patient started having black stool without any abdominal pain or passing bright red blood and hence came to the ED. ? ?In the ED, patient was afebrile, hemodynamically stable ?FOBT positive.  Hemoglobin 11.6. ?CT scan of head showed interval development of an epidural fluid collection overlying the left cerebral hemisphere likely reflecting subacute blood products.  No internal brain herniation. ?Neurosurgery recommended conservative management. ?Admitted to hospitalist service. ? ?Subjective: ?Patient was seen and examined this morning. ?Elderly Caucasian male. ?Lying in bed not in distress.  No new symptoms.  Wife at bedside. ?Patient concerned of the risk of stroke while not on Xarelto.  But he understands that Xarelto has to be held for now because of epidural hematoma and GI  bleeding. ? ?Principal Problem: ?  Lower GI bleed ?Active Problems: ?  Atrial fibrillation ([place]) ?  Abnormal gait ?  IPF (idiopathic pulmonary fibrosis) ([place]) ?  Epidural hematoma ?  ? ?Assessment and Plan: ?Subacute epidural hematoma ?-Likely secondary to the fall 6 days prior to presentation. ?-No focal neurological deficits. ?-Repeat CT scan of head today did not show any new change. ?-Neurosurgery recommended to stop Xarelto.  Patient agrees to hold it for few days but is concerned about stroke from A-fib in the long-term.  Recommended to follow-up with cardiology as an outpatient. ?  ?Lower GI bleed/melena ?History of gastric AVM ?-Reported soft black stool.  Hemoglobin was 11.6 yesterday slightly less then few days ago.  [place]/bright red blood or hematemesis at this time. ?-Continue to monitor hemoglobin. ?-Patient reports that in 2015, he had hematemesis with a drop in hemoglobin down to 6.  EGD showed AVM which was cauterized.  He states he was taking Xarelto and NSAIDs at that time.  He stopped NSAID for some time but resumed again for gout. ?-As of now, Xarelto on hold.  NSAID discouraged as well.  Remains on PPI. ?-Start on full liquid diet today.  N.p.o. after midnight.  Monitor hemoglobin.  If continues to have melanotic stool or blood in stool or drop in hemoglobin, may need formal GI consult for intervention.  Discussed with Dr. Collene Mares today. ?Recent Labs  ?  02/04/21 ?0939 04/15/21 ?1743 04/15/21 ?1743 04/21/21 ?1527 04/21/21 ?2203 04/22/21 ?0129  ?HGB 12.9* 12.3*  --  11.6* 11.7* 11.1*  ?MCV  --  94.6   < > 92.2  --  89.8  ? < > = values in this interval not displayed.  ? ?Chronic A-fib ?-Not on AV nodal blocking agent. ?-PTA, patient was on Xarelto which is currently on hold because of GI bleeding ? ?CAD/CABG ?HLD ?-Continue Zetia, Crestor.  Xarelto on hold ? ?Chronic diastolic CHF ?Essential hypertension ?-Home meds include Entresto 49/51 mg twice daily, hydralazine 25 mg 3 times daily, Isordil 20  mg 3 times daily, torsemide 5 mg daily. ?-Continue Entresto.  Continue to hold Isordil, hydralazine and torsemide. ? ?Unsteadiness, hallucination ?History of stroke with ambulatory dysfunction ?-No new stroke but patient was noted to have epidural hematoma. ?-Patient may have had altered mentation because of pneumonia as well. ?-Continue to monitor mental status change.  Avoid sedatives. ?-PT evaluation obtained.  CIR recommended. ? ?Postviral pneumonia ?-Completed a course of oral Augmentin and doxycycline. ?-WBC count, lactic acid level normal. ?Recent Labs  ?Lab 04/15/21 ?1743 04/21/21 ?1450 04/21/21 ?1527 04/21/21 ?2203 04/22/21 ?0129  ?WBC 16.2*  --  9.9  --  10.1  ?LATICACIDVEN  --  1.1  --  1.0  --   ? ?BPH ?Urinary incontinence ?-On Flomax 0.4 mg daily.  Patient reports urinary incontinence for last few days.  May have had overflow incontinence.  Recommend bladder scan.  Blood pressures running elevated so I increased it to 0.8 mg daily. ? ?Goals of care ?  Code Status: Full Code  ? ? ?Mobility: Encourage ambulation.  PT eval obtained. ? ?Nutritional status:  ?Body mass index is 25.8 kg/m?.  ?  ?  ? ? ? ? ?Diet:  ?Diet Order   ? ?       ?  Diet NPO time specified  Diet effective midnight       ?  ?  Diet full liquid Room service appropriate? Yes; Fluid consistency: Thin  Diet effective now       ?  ? ?  ?  ? ?  ? ? ?DVT prophylaxis:  ?SCDs Start: 04/21/21 1945 ?  ?Antimicrobials: Completed outpatient course of doxycycline and Augmentin today ?Fluid: None currently ?Consultants: Neurosurgery, GI on the phone ?Family Communication: Wife at bedside ? ?Status is: Inpatient ? ?Continue in-hospital care because: Continue to monitor hemoglobin, GI bleeding ?Level of care: Telemetry Medical  ? ?Dispo: The patient is from: Home ?             Anticipated d/c is to: CIR ?             Patient currently is not medically stable to d/c. ?  Difficult to place patient No ? ? ? ? ?Infusions:  ? ? ?Scheduled Meds: ?  benzonatate  100 mg Oral BID  ? ezetimibe  10 mg Oral Daily  ? famotidine  20 mg Oral BID  ? fluticasone furoate-vilanterol  1 puff Inhalation Daily  ? guaiFENesin  1,200 mg Oral BID  ? melatonin  3 mg Oral QHS  ? pantoprazole (PROTONIX) IV  40 mg Intravenous Q12H  ? rosuvastatin  20 mg Oral QHS  ? sacubitril-valsartan  1 tablet Oral BID  ? [START ON 04/23/2021] tamsulosin  0.8 mg Oral Daily  ? ? ?PRN meds: ?hydrALAZINE  ? ?Antimicrobials: ?Anti-infectives (From admission, onward)  ? ? Start     Dose/Rate Route Frequency Ordered Stop  ? 04/21/21 2200  doxycycline (VIBRA-TABS) tablet 100 mg  Status:  Discontinued       ? 100 mg Oral 2 times daily 04/21/21 1946 04/22/21 1353  ? 04/21/21 2000  amoxicillin-clavulanate (  AUGMENTIN) 875-125 MG per tablet 1 tablet  Status:  Discontinued       ? 1 tablet Oral Every 12 hours 04/21/21 1946 04/22/21 1353  ? 04/21/21 1700  ceFEPIme (MAXIPIME) 2 g in sodium chloride 0.9 % 100 mL IVPB       ? 2 g ?200 mL/hr over 30 Minutes Intravenous  Once 04/21/21 1650 04/21/21 1829  ? 04/21/21 1700  vancomycin (VANCOREADY) IVPB 2000 mg/400 mL       ? 2,000 mg ?200 mL/hr over 120 Minutes Intravenous  Once 04/21/21 1650 04/21/21 2024  ? ?  ? ? ?Objective: ?Vitals:  ? 04/22/21 0816 04/22/21 0923  ?BP: (!) 155/85   ?Pulse: 83 83  ?Resp: 18 18  ?Temp: 97.6 ?F (36.4 ?C)   ?SpO2: 93% 93%  ? ? ?Intake/Output Summary (Last 24 hours) at 04/22/2021 1442 ?Last data filed at 04/22/2021 [id number] ?Vane per 24 hour  ?Intake 400 ml  ?Output 800 ml  ?Net -400 ml  ? ?Filed Weights  ? 04/21/21 1515  ?Weight: 83.9 kg  ? ?Weight change:  ?Body mass index is 25.8 kg/m?.  ? ?Physical Exam: ?General exam: Pleasant, elderly Caucasian male.  Not in physical distress ?Skin: No rashes, lesions or ulcers. ?HEENT: Atraumatic, normocephalic, no obvious bleeding ?Lungs: Clear to auscultation bilaterally ?CVS: Regular rate and rhythm, normal ?GI/Abd soft, nontender, nondistended, bowel sound present ?CNS: Alert, awake, oriented to  place and person ?Psychiatry: Mood appropriate ?Extremities: No pedal edema, no calf redness ? ?Data Review: I have personally reviewed the laboratory data and studies available. ? ?F/u labs ordered ?Unresulted Labs (From admission,

## 2021-04-22 NOTE — Progress Notes (Signed)
Mobility Specialist Progress Note: ? ? 04/22/21 1638  ?Mobility  ?Activity Ambulated with assistance in room  ?Level of Assistance Contact guard assist, steadying assist  ?Assistive Device Four point cane  ?Distance Ambulated (ft) 40 ft  ?Activity Response Tolerated well  ?$Mobility charge 1 Mobility  ? ?Pt received in bed willing to participate in mobility. No complaints of pain. Left in bed with call bell in reach and all needs met. ? ?Calvin Brown ?Mobility Specialist ?Primary Phone [phone] ? ?

## 2021-04-22 NOTE — Progress Notes (Signed)
Physical Therapy Evaluation ?Patient Details ?Name: Calvin Brown ?MRN: [id number] ?DOB: Nov 29, 1933 ?Today's Date: 04/22/2021 ? ?History of Present Illness ? 88 yo male with onset of fall last week was found to have no intracranial findings, but did have PNA.  New admission on 3.26 for hallucinations and light sensitivity, now has low to intermediate density subdural collection along the left convexity.  GI bleed dx now as well.  PMHx:  a-fib, blood thinners, chronic ambulatory dysfunction, HTN, pulm fibrosis, CHF, GI bleed from AVM with cauterization,  ?Clinical Impression ? Pt was seen for mobility on HW and to maneuver with close obstacles.  He is unsafe with HW without assistance and repeated cues for placement of HW, for step speed and placement and for sequencing transitions to sit and stand.  Tends to get too far out on bed to stand and tends to sit with low control of descent.  Follow for acute PT goals of PT to prepare for CIR request to admit.  Wife and pt are in agreement with the plan. ?   ? ?Recommendations for follow up therapy are one component of a multi-disciplinary discharge planning process, led by the attending physician.  Recommendations may be updated based on patient status, additional functional criteria and insurance authorization. ? ?Follow Up Recommendations Acute inpatient rehab (3hours/day) ? ?  ?Assistance Recommended at Discharge Frequent or constant Supervision/Assistance  ?Patient can return home with the following ? A little help with walking and/or transfers;A little help with bathing/dressing/bathroom;Assistance with cooking/housework;Assist for transportation;Help with stairs or ramp for entrance ? ?  ?Equipment Recommendations None recommended by PT  ?Recommendations for Other Services ? Rehab consult  ?  ?Functional Status Assessment Patient has had a recent decline in their functional status and demonstrates the ability to make significant improvements in function in a reasonable  and predictable amount of time.  ? ?  ?Precautions / Restrictions Precautions ?Precautions: Fall ?Precaution Comments: monitor HR and sats ?Restrictions ?Weight Bearing Restrictions: No  ? ?  ? ?Mobility ? Bed Mobility ?Overal bed mobility: Needs Assistance ?Bed Mobility: Supine to Sit, Sit to Supine ?  ?  ?Supine to sit: Min assist ?Sit to supine: Min assist ?  ?General bed mobility comments: scooting up in bed 2 mod assist ?  ? ?Transfers ?Overall transfer level: Needs assistance ?Equipment used: 1 person hand held assist, Hemi-walker ?Transfers: Sit to/from Stand ?Sit to Stand: Min assist, Mod assist ?  ?  ?  ?  ?  ?General transfer comment: Pt was assisted to stand with min assist for higher level and mod for lower level ?  ? ?Ambulation/Gait ?Ambulation/Gait assistance: Min assist ?Gait Distance (Feet): 40 Feet (20 x 2) ?Assistive device: Hemi-walker ?Gait Pattern/deviations: Step-to pattern, Step-through pattern, Decreased stride length ?Gait velocity: reduced ?Gait velocity interpretation: <1.31 ft/sec, indicative of household ambulator ?Pre-gait activities: standing balance cues with lateral corrections ?General Gait Details: pt is up to walk with help, requiring dense cues for judging distance to advance HW and to sequence the steps with walker.  Overshoots walker placement and then gets unsteady toward R and forward ? ?Stairs ?  ?  ?  ?  ?  ? ?Wheelchair Mobility ?  ? ?Modified Rankin (Stroke Patients Only) ?  ? ?  ? ?Balance Overall balance assessment: Needs assistance, History of Falls ?Sitting-balance support: Bilateral upper extremity supported, Feet supported ?Sitting balance-Leahy Scale: Good ?  ?  ?Standing balance support: Bilateral upper extremity supported, During functional activity ?Standing balance-Leahy Scale: Poor ?Standing  balance comment: struggling with correction of sit to stand with weakness to power up from lower height sitting, tends to scoot out on bed too far ?  ?  ?  ?  ?  ?  ?  ?   ?  ?  ?  ?   ? ? ? ?Pertinent Vitals/Pain Pain Assessment ?Pain Assessment: No/denies pain  ? ? ?Home Living Family/patient expects to be discharged to:: Private residence ?Living Arrangements: Spouse/significant other ?Available Help at Discharge: Family;Available 24 hours/day ?Type of Home: House ?Home Access: Stairs to enter ?Entrance Stairs-Rails: Left ?Entrance Stairs-Number of Steps: 3 ?  ?Home Layout: One level ?Home Equipment: Cane - single Librarian, academic (2 wheels) Lexicographer) ?Additional Comments: used SPC in R hand and L hand on rail to enter house  ?  ?Prior Function Prior Level of Function : History of Falls (last six months) ?  ?  ?  ?  ?  ?  ?Mobility Comments: requires hands on him as well as AD ?  ?  ? ? ?Hand Dominance  ? Dominant Hand: Right ? ?  ?Extremity/Trunk Assessment  ? Upper Extremity Assessment ?Upper Extremity Assessment: Overall WFL for tasks assessed ?  ? ?Lower Extremity Assessment ?Lower Extremity Assessment: Overall WFL for tasks assessed ?  ? ?Cervical / Trunk Assessment ?Cervical / Trunk Assessment: Normal  ?Communication  ? Communication: No difficulties  ?Cognition Arousal/Alertness: Awake/alert ?Behavior During Therapy: Impulsive ?Overall Cognitive Status: Impaired/Different from baseline ?Area of Impairment: Problem solving, Awareness, Safety/judgement, Following commands, Memory, Attention, Orientation ?  ?  ?  ?  ?  ?  ?  ?  ?Orientation Level: Situation ?Current Attention Level: Selective ?Memory: Decreased recall of precautions ?Following Commands: Follows one step commands with increased time ?Safety/Judgement: Decreased awareness of deficits, Decreased awareness of safety ?Awareness: Intellectual ?Problem Solving: Slow processing, Requires verbal cues, Requires tactile cues ?  ?  ?  ? ?  ?General Comments General comments (skin integrity, edema, etc.): Pt is up to walk with PT and noted his difficulty with controlling imbalance on HW both with placement of device  and with safety awareness of speed and sequence ? ?  ?Exercises    ? ?Assessment/Plan  ?  ?PT Assessment Patient needs continued PT services  ?PT Problem List Decreased range of motion;Decreased activity tolerance;Decreased balance;Decreased coordination;Decreased knowledge of use of DME;Decreased safety awareness ? ?   ?  ?PT Treatment Interventions DME instruction;Gait training;Functional mobility training;Therapeutic activities;Stair training;Therapeutic exercise;Balance training;Neuromuscular re-education;Patient/family education   ? ?PT Goals (Current goals can be found in the Care Plan section)  ?Acute Rehab PT Goals ?Patient Stated Goal: to get home and independent to walk again ?PT Goal Formulation: With patient/family ?Time For Goal Achievement: 05/06/21 ?Potential to Achieve Goals: Good ? ?  ?Frequency Min 4X/week ?  ? ? ?Co-evaluation   ?  ?  ?  ?  ? ? ?  ?AM-PAC PT "6 Clicks" Mobility  ?Outcome Measure Help needed turning from your back to your side while in a flat bed without using bedrails?: A Little ?Help needed moving from lying on your back to sitting on the side of a flat bed without using bedrails?: A Little ?Help needed moving to and from a bed to a chair (including a wheelchair)?: A Little ?Help needed standing up from a chair using your arms (e.g., wheelchair or bedside chair)?: A Little ?Help needed to walk in hospital room?: A Lot ?Help needed climbing 3-5 steps with a railing? : Total ?6 Click  Score: 15 ? ?  ?End of Session Equipment Utilized During Treatment: Gait belt ?Activity Tolerance: Patient limited by fatigue;Treatment limited secondary to medical complications (Comment) ?Patient left: in bed;with call bell/phone within reach;with bed alarm set;with family/visitor present;with nursing/sitter in room ?Nurse Communication: Mobility status ?PT Visit Diagnosis: Unsteadiness on feet (R26.81) ?  ? ?Time: [id number] ?PT Time Calculation (min) (ACUTE ONLY): 33 min ? ? ?Charges:   PT  Evaluation ?$PT Eval Moderate Complexity: 1 Mod ?PT Treatments ?$Gait Training: 8-22 mins ?  ?   ? ?Ivar Drapeuth E Ramello Cordial ?04/22/2021, 1:08 PM ? ?Samul Dadauth Reily Ilic, PT PhD ?Acute Rehab Dept. Number: [facility] [phone] and [facility] [phone]

## 2021-04-22 NOTE — Progress Notes (Signed)
SLP Cancellation Note ? ?Patient Details ?Name: Calvin Brown ?MRN: [id number] ?DOB: [birth year] ? ? ?Cancelled treatment:       Reason Eval/Treat Not Completed: Patient at procedure or test/unavailable- Pt is NPO for procedure. Will f/u for swallow evaluation when appropriate. ? ?Ladislaus Repsher L. Tessla Spurling, MA CCC/SLP ?Acute Rehabilitation Services ?Office number [phone] ?Pager [phone] ? ? ? ?Juan Quam Laurice ?04/22/2021, 9:56 AM ?

## 2021-04-23 DIAGNOSIS — K922 Gastrointestinal hemorrhage, unspecified: Secondary | ICD-10-CM | POA: Diagnosis not present

## 2021-04-23 LAB — BASIC METABOLIC PANEL
Anion gap: 9 (ref 5–15)
BUN: 13 mg/dL (ref 8–23)
CO2: 24 mmol/L (ref 22–32)
Calcium: 8.4 mg/dL — ABNORMAL LOW (ref 8.9–10.3)
Chloride: 93 mmol/L — ABNORMAL LOW (ref 98–111)
Creatinine, Ser: 0.7 mg/dL (ref 0.61–1.24)
GFR, Estimated: >60 mL/min (ref >60)
Glucose, Bld: 135 mg/dL — ABNORMAL HIGH (ref 70–99)
Potassium: 3.8 mmol/L (ref 3.5–5.1)
Sodium: 126 mmol/L — ABNORMAL LOW (ref 135–145)

## 2021-04-23 LAB — CBC WITH DIFFERENTIAL/PLATELET
Abs Immature Granulocytes: 0.12 10*3/uL — ABNORMAL HIGH (ref 0.00–0.07)
Basophils Absolute: 0 10*3/uL (ref 0.0–0.1)
Basophils Relative: 0 %
Eosinophils Absolute: 0 10*3/uL (ref 0.0–0.5)
Eosinophils Relative: 0 %
HCT: 35 % — ABNORMAL LOW (ref 39.0–52.0)
Hemoglobin: 11.9 g/dL — ABNORMAL LOW (ref 13.0–17.0)
Immature Granulocytes: 1 %
Lymphocytes Relative: 6 %
Lymphs Abs: 0.7 10*3/uL (ref 0.7–4.0)
MCH: 30.1 pg (ref 26.0–34.0)
MCHC: 34 g/dL (ref 30.0–36.0)
MCV: 88.6 fL (ref 80.0–100.0)
Monocytes Absolute: 0.6 10*3/uL (ref 0.1–1.0)
Monocytes Relative: 5 %
Neutro Abs: 10.2 10*3/uL — ABNORMAL HIGH (ref 1.7–7.7)
Neutrophils Relative %: 88 %
Platelets: 283 10*3/uL (ref 150–400)
RBC: 3.95 MIL/uL — ABNORMAL LOW (ref 4.22–5.81)
RDW: 12.6 % (ref 11.5–15.5)
WBC: 11.7 10*3/uL — ABNORMAL HIGH (ref 4.0–10.5)
nRBC: 0 % (ref 0.0–0.2)

## 2021-04-23 MED ORDER — ISOSORBIDE DINITRATE 10 MG PO TABS
20.0000 mg | ORAL_TABLET | Freq: Three times a day (TID) | ORAL | Status: DC
  Administered 2021-04-23 – 2021-04-24 (×2): 20 mg via ORAL
  Filled 2021-04-23 (×3): qty 2

## 2021-04-23 MED ORDER — HYDRALAZINE HCL 25 MG PO TABS
25.0000 mg | ORAL_TABLET | Freq: Three times a day (TID) | ORAL | Status: DC
  Administered 2021-04-23 – 2021-04-24 (×4): 25 mg via ORAL
  Filled 2021-04-23 (×5): qty 1

## 2021-04-23 NOTE — Progress Notes (Signed)
Inpatient Rehab Admissions Coordinator:  ? ?Met with patient at bedside to discuss CIR recommendations, as well as goals/expectations of CIR stay.  Noted to pt 3 hrs/day of therapy, physician follow up, and average length of stay 2 weeks (dependent on progress).  Pt agreeable to CIR stay once medically cleared and agreed for me to discuss with spouse.  I will call her this afternoon and f/u with them tomorrow regarding readiness and bed availability.  ? ?Shann Medal, PT, DPT ?Admissions Coordinator ?[phone] ?04/23/21  ?2:52 PM ? ?

## 2021-04-23 NOTE — Progress Notes (Signed)
Mobility Specialist Progress Note: ? ? 04/23/21 1355  ?Mobility  ?Activity Ambulated with assistance in hallway  ?Level of Assistance Contact guard assist, steadying assist  ?Assistive Device Front wheel walker  ?Distance Ambulated (ft) 150 ft  ?Activity Response Tolerated well  ?$Mobility charge 1 Mobility  ? ?Pt received in bathroom with NT, willing to participate in mobility. No complaints of pain but did state his feet felt heavy. Left in chair with call bell in reach and all needs met.  ? ?Vonette Grosso ?Mobility Specialist ?Primary Phone [phone] ? ?

## 2021-04-23 NOTE — PMR Pre-admission (Signed)
PMR Admission Coordinator Pre-Admission Assessment ? ?Patient: Calvin Brown is an 88 y.o., male ?MRN: [id number] ?DOB: 1933-07-18 ?Height: 5\' 11"  (180.3 cm) ?Weight: 83.9 kg ? ?Insurance Information ?HMO:     PPO:      PCP:      IPA:      80/20:      OTHER:  ?PRIMARY: Medicare A/B      Policy#: [id number]      Subscriber: pt ?CM Name:       Phone#:      Fax#:  ?Pre-Cert#: verified online      Employer:  ?Benefits:  Phone #:      Name:  ?Eff. Date: 04/27/00 A/B     Deduct: $1600      Out of Pocket Max: n/a      Life Max: n/a ?CIR: 100%      SNF: 20 full days ?Outpatient: 80%     Co-Ins: 20% ?Home Health: 100%      Co-Pay:  ?DME: 80%     Co-Ins: 20% ?Providers:  ?SECONDARY: Commercial Supplement      Policy#: [id number]     Phone#:  ? ?Financial Counselor:       Phone#:  ? ?The ?Data Collection Information Summary? for patients in Inpatient Rehabilitation Facilities with attached ?Privacy Act [place] Records? was provided and verbally reviewed with: Patient ? ?Emergency Contact Information ?Contact Information   ? ? Name Relation Home Work Mobile  ? Sandeen,VICKY Spouse   [phone]  ? ?  ? ? ?Current Medical History  ?Patient Admitting Diagnosis: Epidural hematoma, GI Bleed ? ?History of Present Illness: Pt is a 88 y/o male with PMH of CVA, a-fib on Xarelto, CAD/CABG, GI Bleed 2/2 AVM s/p cauterization in [id number], chronic diastolic CHF, pulmonary fibrosis, and HTN, admitted to ED with AMS, unsteady gait, and melena on 04/21/21.  One week prior to admit pt had a fall and was cleared in the ED with negative head CT.  Pt started having nightmares, auditory, and visual hallucinations and family also noted slurred speech, sensitivity to lights, and urinary incontinence, in addition to black stool without BRBPR.  In ED pt was afebrile and hemodynamically stable.  FOBT positive, Hgb 11.6.  CT head showed interval developed of an epidural hematoma overlying th left cerebral hemisphere.  No brain herniation and  neurosurgery recommended conservative management and stop xarelto at least 1 week.  Recommend f/u with outpatient cardiology to resume.  Pt's hemoglobin remained stable without Carbone/bright red blood or hematemesis.  Pt tolerating a regular diet.  If pt develops Manship bleeding or shows drop in hemoglobin may consult GI.  Therapy evaluations completed and pt was recommended for CIR.    ? ?Complete NIHSS TOTAL: 1 ? ?Patient's medical record from [name] has been reviewed by the rehabilitation admission coordinator and physician. ? ?Past Medical History  ?Past Medical History:  ?Diagnosis Date  ? Atrial fibrillation ([place])   ? CAD (coronary artery disease)   ? CVA (cerebral vascular accident) ([place]) 05/27/2013  ? GI bleed 05/27/2013  ? HTN (hypertension)   ? Hx of CABG   ? Possibly 2015?  ? Hyperlipidemia   ? ? ?Has the patient had major surgery during 100 days prior to admission? No ? ?Family History   ?family history includes Breast cancer (age of onset: 7) in his mother; Heart attack (age of onset: 6) in his sister; Hypertension in his father; Vision loss in his mother. ? ?Current Medications ? ?Current Facility-Administered Medications:  ?  benzonatate (TESSALON) capsule 100 mg, 100 mg, Oral, BID, Wynetta Fines T, MD, 100 mg at 04/24/21 [id number] ?  ezetimibe (ZETIA) tablet 10 mg, 10 mg, Oral, Daily, Wynetta Fines T, MD, 10 mg at 04/24/21 [id number] ?  famotidine (PEPCID) tablet 20 mg, 20 mg, Oral, BID, Wynetta Fines T, MD, 20 mg at 04/24/21 [id number] ?  fluticasone furoate-vilanterol (BREO ELLIPTA) 100-25 MCG/ACT 1 puff, 1 puff, Inhalation, Daily, Lequita Halt, MD, 1 puff at 04/24/21 0848 ?  guaiFENesin (MUCINEX) 12 hr tablet 1,200 mg, 1,200 mg, Oral, BID, Wynetta Fines T, MD, 1,200 mg at 04/24/21 [id number] ?  hydrALAZINE (APRESOLINE) tablet 10 mg, 10 mg, Oral, Q6H PRN, Wynetta Fines T, MD ?  hydrALAZINE (APRESOLINE) tablet 25 mg, 25 mg, Oral, TID, Dahal, Binaya, MD, 25 mg at 04/24/21 [id number] ?  isosorbide dinitrate (ISORDIL) tablet 20 mg, 20  mg, Oral, TID, Dahal, Binaya, MD, 20 mg at 04/24/21 0951 ?  melatonin tablet 3 mg, 3 mg, Oral, QHS, Wynetta Fines T, MD, 3 mg at 04/23/21 2155 ?  pantoprazole (PROTONIX) injection 40 mg, 40 mg, Intravenous, Q12H, Wynetta Fines T, MD, 40 mg at 04/24/21 0954 ?  rosuvastatin (CRESTOR) tablet 20 mg, 20 mg, Oral, QHS, Wynetta Fines T, MD, 20 mg at 04/23/21 2155 ?  sacubitril-valsartan (ENTRESTO) 49-51 mg per tablet, 1 tablet, Oral, BID, Lequita Halt, MD, 1 tablet at 04/24/21 [phone] ?  tamsulosin (FLOMAX) capsule 0.8 mg, 0.8 mg, Oral, Daily, Dahal, Binaya, MD, 0.8 mg at 04/24/21 0952 ?  torsemide (DEMADEX) tablet 5 mg, 5 mg, Oral, Daily, Dahal, Binaya, MD, 5 mg at 04/24/21 0957 ? ?Patients Current Diet:  ?Diet Order   ? ?       ?  Diet general       ?  ?  Diet regular Room service appropriate? Yes; Fluid consistency: Thin  Diet effective now       ?  ? ?  ?  ? ?  ? ? ?Precautions / Restrictions ?Precautions ?Precautions: Fall ?Precaution Comments: monitor HR and sats ?Restrictions ?Weight Bearing Restrictions: No  ? ?Has the patient had 2 or more falls or a fall with injury in the past year? Yes ? ?Prior Activity Level ?Limited Community (1-2x/wk): was occasionally going out for walks, using quad cane for mobility, not driving ? ?Prior Functional Level ?Self Care: Did the patient need help bathing, dressing, using the toilet or eating? Independent ? ?Indoor Mobility: Did the patient need assistance with walking from room to room (with or without device)? Independent ? ?Stairs: Did the patient need assistance with internal or external stairs (with or without device)? Independent ? ?Functional Cognition: Did the patient need help planning regular tasks such as shopping or remembering to take medications? Independent ? ?Patient Information ?Are you of Hispanic, Latino/a,or Spanish origin?: A. No, not of Hispanic, Latino/a, or Spanish origin ?What is your race?: A. White ?Do you need or want an interpreter to communicate with a  doctor or health care staff?: 0. No ? ?Patient's Response To:  ?Health Literacy and Transportation ?Is the patient able to respond to health literacy and transportation needs?: Yes ?Health Literacy - How often do you need to have someone help you when you read instructions, pamphlets, or other written material from your doctor or pharmacy?: Never ?In the past 12 months, has lack of transportation kept you from medical appointments or from getting medications?: No ?In the past 12 months, has lack of transportation kept you from meetings, work, or from  getting things needed for daily living?: No ? ?Home Assistive Devices / Equipment ?Home Assistive Devices/Equipment: None ?Home Equipment: Kasandra Knudsen - single point, Conservation officer, nature (2 wheels) Management consultant) ? ?Prior Device Use: Indicate devices/aids used by the patient prior to current illness, exacerbation or injury?  Cane ? ?Current Functional Level ?Cognition ? Overall Cognitive Status: Impaired/Different from baseline ?Current Attention Level: Selective ?Orientation Level: Oriented X4 ?Following Commands: Follows one step commands with increased time ?Safety/Judgement: Decreased awareness of deficits, Decreased awareness of safety ?   ?Extremity Assessment ?(includes Sensation/Coordination) ? Upper Extremity Assessment: Overall WFL for tasks assessed  ?Lower Extremity Assessment: Overall WFL for tasks assessed  ?  ?ADLs ?    ?  ?Mobility ? Overal bed mobility: Needs Assistance ?Bed Mobility: Supine to Sit ?Supine to sit: Supervision ?Sit to supine: Min assist ?General bed mobility comments: scooting up in bed 2 mod assist  ?  ?Transfers ? Overall transfer level: Needs assistance ?Equipment used: Rolling walker (2 wheels) ?Transfers: Sit to/from Stand, Bed to chair/wheelchair/BSC ?Sit to Stand: Min guard ?General transfer comment: min guard for safety, cues for hand placement especially for controlled descent  ?  ?Ambulation / Gait / Stairs / Wheelchair Mobility ?  Ambulation/Gait ?Ambulation/Gait assistance: Min assist ?Gait Distance (Feet): 120 Feet ?Assistive device: Rolling walker (2 wheels), Quad cane ?Gait Pattern/deviations: Step-through pattern, Decreased stride length, Trunk fl

## 2021-04-23 NOTE — Progress Notes (Signed)
Physical Therapy Treatment ?Patient Details ?Name: Calvin Brown ?MRN: [id number] ?DOB: August 26, 1933 ?Today's Date: 04/23/2021 ? ? ?History of Present Illness 88 yo male with onset of fall last week was found to have no intracranial findings, but did have PNA.  New admission on 3.26 for hallucinations and light sensitivity, now has low to intermediate density subdural collection along the left convexity.  GI bleed dx now as well.  PMHx:  a-fib, blood thinners, chronic ambulatory dysfunction, HTN, pulm fibrosis, CHF, GI bleed from AVM with cauterization, ? ?  ?PT Comments  ? ? Pt agreeable to session with focus on safe mobility and use of most appropriate AD.  Pt requiring close hands on guard during ambulation with quad cane as pt drifting R/L, scissoring steps and overall demonstrating general unsteadiness. Quad cane abandoned in favor of RW with pt demonstrating much more stable gait without drifting and ability to elevate trunk and maintain with min guard. Pt in agreement that RW most appropriate at this time for safety and mobility progression. Pt continues to benefit from skilled PT services to progress toward functional mobility goals.  ?  ?Recommendations for follow up therapy are one component of a multi-disciplinary discharge planning process, led by the attending physician.  Recommendations may be updated based on patient status, additional functional criteria and insurance authorization. ? ?Follow Up Recommendations ? Acute inpatient rehab (3hours/day) ?  ?  ?Assistance Recommended at Discharge Frequent or constant Supervision/Assistance  ?Patient can return home with the following A little help with walking and/or transfers;A little help with bathing/dressing/bathroom;Assistance with cooking/housework;Assist for transportation;Help with stairs or ramp for entrance ?  ?Equipment Recommendations ? None recommended by PT  ?  ?Recommendations for Other Services Rehab consult ? ? ?  ?Precautions / Restrictions  Precautions ?Precautions: Fall ?Precaution Comments: monitor HR and sats ?Restrictions ?Weight Bearing Restrictions: No  ?  ? ?Mobility ? Bed Mobility ?Overal bed mobility: Needs Assistance ?Bed Mobility: Supine to Sit ?  ?  ?Supine to sit: Supervision ?  ?  ?  ?  ? ?Transfers ?Overall transfer level: Needs assistance ?Equipment used: Rolling walker (2 wheels) ?Transfers: Sit to/from Stand, Bed to chair/wheelchair/BSC ?Sit to Stand: Min guard ?  ?  ?  ?  ?  ?General transfer comment: min guard for safety, cues for hand placement especially for controlled descent ?  ? ?Ambulation/Gait ?Ambulation/Gait assistance: Min assist ?Gait Distance (Feet): 120 Feet ?Assistive device: Rolling walker (2 wheels), Quad cane ?Gait Pattern/deviations: Step-through pattern, Decreased stride length, Trunk flexed, Drifts right/left ?Gait velocity: reduced ?  ?  ?General Gait Details: pt with noted unsteadiness with quad cane drifting R/L and scissoring feet, pt requesting use as is his baseline, swtiched to RW for 90' with noted increase in stability, no drifting, pt in agreement that RW most appropriate AD at this time. ? ? ?Stairs ?  ?  ?  ?  ?  ? ? ?Wheelchair Mobility ?  ? ?Modified Rankin (Stroke Patients Only) ?  ? ? ?  ?Balance Overall balance assessment: Needs assistance, History of Falls ?Sitting-balance support: Bilateral upper extremity supported, Feet supported ?Sitting balance-Leahy Scale: Good ?  ?  ?Standing balance support: Bilateral upper extremity supported, During functional activity ?Standing balance-Leahy Scale: Poor ?Standing balance comment: poor balance without UE support in BR, fair with BUE support on RW. heavy cues during sit <> stand for awareness of balance ?  ?  ?  ?  ?  ?  ?  ?  ?  ?  ?  ?  ? ?  ?  Cognition Arousal/Alertness: Awake/alert ?Behavior During Therapy: Impulsive ?Overall Cognitive Status: Impaired/Different from baseline ?Area of Impairment: Problem solving, Awareness, Safety/judgement,  Following commands, Memory, Attention, Orientation ?  ?  ?  ?  ?  ?  ?  ?  ?Orientation Level: Situation ?Current Attention Level: Selective ?Memory: Decreased recall of precautions ?Following Commands: Follows one step commands with increased time ?Safety/Judgement: Decreased awareness of deficits, Decreased awareness of safety ?Awareness: Intellectual ?Problem Solving: Slow processing, Requires verbal cues, Requires tactile cues ?  ?  ?  ? ?  ?Exercises   ? ?  ?General Comments   ?  ?  ? ?Pertinent Vitals/Pain    ? ? ?Home Living   ?  ?  ?  ?  ?  ?  ?  ?  ?  ?   ?  ?Prior Function    ?  ?  ?   ? ?PT Goals (current goals can now be found in the care plan section) Acute Rehab PT Goals ?Patient Stated Goal: to get home and independent to walk again ?PT Goal Formulation: With patient/family ?Time For Goal Achievement: 05/06/21 ? ?  ?Frequency ? ? ? Min 4X/week ? ? ? ?  ?PT Plan Current plan remains appropriate  ? ? ?Co-evaluation   ?  ?  ?  ?  ? ?  ?AM-PAC PT "6 Clicks" Mobility   ?Outcome Measure ? Help needed turning from your back to your side while in a flat bed without using bedrails?: A Little ?Help needed moving from lying on your back to sitting on the side of a flat bed without using bedrails?: A Little ?Help needed moving to and from a bed to a chair (including a wheelchair)?: A Little ?Help needed standing up from a chair using your arms (e.g., wheelchair or bedside chair)?: A Little ?Help needed to walk in hospital room?: A Little ?Help needed climbing 3-5 steps with a railing? : Total ?6 Click Score: 16 ? ?  ?End of Session Equipment Utilized During Treatment: Gait belt ?Activity Tolerance: Patient limited by fatigue;Treatment limited secondary to medical complications (Comment) ?Patient left: in chair;with call bell/phone within reach;with nursing/sitter in room;with family/visitor present Wellsite geologist) ?Nurse Communication: Mobility status ?PT Visit Diagnosis: Unsteadiness on feet (R26.81) ?   ? ? ?Time: [id number] ?PT Time Calculation (min) (ACUTE ONLY): 24 min ? ?Charges:  $Gait Training: 8-22 mins ?$Therapeutic Activity: 8-22 mins          ?      ?     ?Audry Riles. PTA ?Acute Rehabilitation Services ?Office: [phone] ? ? ?Betsey Holiday Tamyrah Burbage ?04/23/2021, 12:27 PM ? ?

## 2021-04-23 NOTE — Progress Notes (Signed)
?PROGRESS NOTE ? ?Calvin Brown  ?DOB: 20-Jan-1934  ?PCP: Sueanne Margarita, DO ?[id number]  ?DOA: 04/21/2021 ? LOS: 2 days  ?Hospital Day: 3 ? ?Brief narrative: ?Calvin Brown is a 88 y.o. male with PMH significant for stroke with ambulatory function, A-fib on Xarelto, CAD/CABG, GI bleed secondary to gastric AVM s/p cauterization 123456, chronic diastolic CHF, pulmonary fibrosis, HTN, chronic constipation. ?Patient was brought to ED from home with melena and unsteady gait. ?Patient had COVID 3 weeks ago and was treated with 4 days of antibiotic treatment.  Last week, patient had an episode of fall while [place] shopping [place] and hit his head.  He did not pass out or feel any weakness or numbness at the time.  He was seen in the ED.  CT head was negative.   ?X-ray showed right-sided pneumonia and hence patient was started on a course of Augmentin and doxycycline.  His weakness, cough improved but over the course of next few days, but patient started having nightmares, auditory and visual hallucinations.  Family also noted slurred speech, sensitivity to light and urinary incontinence. ?Family also noted him unsteady.   ?3/25, patient started having black stool without any abdominal pain or passing bright red blood and hence came to the ED. ? ?In the ED, patient was afebrile, hemodynamically stable ?FOBT positive.  Hemoglobin 11.6. ?CT scan of head showed interval development of an epidural fluid collection overlying the left cerebral hemisphere likely reflecting subacute blood products.  No internal brain herniation. ?Neurosurgery recommended conservative management. ?Admitted to hospitalist service. ? ?Subjective: ?Patient was seen and examined this morning. ?Elderly Caucasian male. ?Sitting up at the edge of the bed.  Taking his breakfast. ?Hemoglobin is stable and actually better this morning.  Had 1 more episode of black tarry stool this morning. ?Labs from this morning with sodium low at 126. ? ?Principal  Problem: ?  Lower GI bleed ?Active Problems: ?  Atrial fibrillation ([place]) ?  Abnormal gait ?  IPF (idiopathic pulmonary fibrosis) ([place]) ?  Epidural hematoma ?  ? ?Assessment and Plan: ?Subacute epidural hematoma ?-Likely secondary to the fall 6 days prior to presentation. ?-No focal neurological deficits. ?-Repeat CT scan of head today did not show any new change. ?-Neurosurgery recommended to stop Xarelto.  Patient agrees to hold it for few days but is concerned about stroke from A-fib in the long-term.  Recommended to follow-up with cardiology as an outpatient. ?  ?Lower GI bleed/melena ?History of gastric AVM ?-Reported soft black stool.  Hemoglobin was 11.6 yesterday slightly less then few days ago.  [place]/bright red blood or hematemesis at this time. ?-Continue to monitor hemoglobin. ?-Patient reports that in 2015, he had hematemesis with a drop in hemoglobin down to 6.  EGD showed AVM which was cauterized.  He states he was taking Xarelto and NSAIDs at that time.  He stopped NSAID for some time but resumed again for gout. ?-As of now, Xarelto on hold.  NSAID discouraged as well.  Remains on PPI. ?-Hemoglobin stable.  Patient may continue to have melena for next few episodes. ?-Regular diet started this morning.  Discussed with GI Dr. Collene Mares on 3/27. ?-If patient starts to have [place] bleeding or starts dropping hemoglobin, may need formal GI consultation. ?Recent Labs  ?  04/15/21 ?1743 04/21/21 ?1527 04/21/21 ?2203 04/22/21 ?0129 04/23/21 ?0149  ?HGB 12.3* 11.6* 11.7* 11.1* 11.9*  ?MCV 94.6 92.2  --  89.8 88.6  ? ?Chronic A-fib ?-Not on AV nodal blocking agent. ?-PTA,  patient was on Xarelto which is currently on hold because of GI bleeding ? ?CAD/CABG ?HLD ?-Continue Zetia, Crestor.  Xarelto on hold ? ?Chronic diastolic CHF ?Essential hypertension ?-Home meds include Entresto 49/51 mg twice daily, hydralazine 25 mg 3 times daily, Isordil 20 mg 3 times daily, torsemide 5 mg daily. ?-On admission, was resumed on  Entresto.  Blood pressure was up overnight.  Resume Isordil and hydralazine this morning.  Does not look volume overloaded.  Torsemide remains on hold at this time. ? ?Hyponatremia ?-Sodium level further down at 126 today.  Probably because of n.p.o. status for 2 days.  Back in diet.  Expect sodium improvement tomorrow.  Looks euvolemic. ?Recent Labs  ?Lab 04/21/21 ?1527 04/22/21 ?0129 04/23/21 ?0149  ?NA 131* 131* 126*  ? ?Unsteadiness, hallucination ?History of stroke with ambulatory dysfunction ?-No new stroke but patient was noted to have epidural hematoma. ?-Patient may have had altered mentation because of pneumonia as well. ?-Continue to monitor mental status change.  Avoid sedatives. ?-PT evaluation obtained.  CIR recommended. ? ?Postviral pneumonia ?-Completed a course of oral Augmentin and doxycycline. ?-WBC count, lactic acid level normal. ?Recent Labs  ?Lab 04/21/21 ?1450 04/21/21 ?1527 04/21/21 ?2203 04/22/21 ?0129 04/23/21 ?0149  ?WBC  --  9.9  --  10.1 11.7*  ?LATICACIDVEN 1.1  --  1.0  --   --   ? ?BPH ?Urinary incontinence ?-Was in Flomax 0.4 mg daily.  Because of persistent nocturia and frequency, I increased it to 0.8 mg daily  ? ?Goals of care ?  Code Status: Full Code  ? ? ?Mobility: Encourage ambulation.  PT eval obtained. ? ?Nutritional status:  ?Body mass index is 25.8 kg/m?.  ?  ?  ? ? ? ? ?Diet:  ?Diet Order   ? ?       ?  Diet regular Room service appropriate? Yes; Fluid consistency: Thin  Diet effective now       ?  ? ?  ?  ? ?  ? ? ?DVT prophylaxis:  ?SCDs Start: 04/21/21 1945 ?  ?Antimicrobials: Completed outpatient course of doxycycline and Augmentin ?Fluid: None currently ?Consultants: Neurosurgery, GI on the phone ?Family Communication: Wife at bedside ? ?Status is: Inpatient ? ?Continue in-hospital care because: Continue to monitor hemoglobin, GI bleeding ?Level of care: Telemetry Medical  ? ?Dispo: The patient is from: Home ?             Anticipated d/c is to: CIR ?              Patient currently is not medically stable to d/c. ?  Difficult to place patient No ? ? ? ? ?Infusions:  ? ? ?Scheduled Meds: ? benzonatate  100 mg Oral BID  ? ezetimibe  10 mg Oral Daily  ? famotidine  20 mg Oral BID  ? fluticasone furoate-vilanterol  1 puff Inhalation Daily  ? guaiFENesin  1,200 mg Oral BID  ? hydrALAZINE  25 mg Oral TID  ? isosorbide dinitrate  20 mg Oral TID  ? melatonin  3 mg Oral QHS  ? pantoprazole (PROTONIX) IV  40 mg Intravenous Q12H  ? rosuvastatin  20 mg Oral QHS  ? sacubitril-valsartan  1 tablet Oral BID  ? tamsulosin  0.8 mg Oral Daily  ? ? ?PRN meds: ?hydrALAZINE  ? ?Antimicrobials: ?Anti-infectives (From admission, onward)  ? ? Start     Dose/Rate Route Frequency Ordered Stop  ? 04/21/21 2200  doxycycline (VIBRA-TABS) tablet 100 mg  Status:  Discontinued       ? 100 mg Oral 2 times daily 04/21/21 1946 04/22/21 1353  ? 04/21/21 2000  amoxicillin-clavulanate (AUGMENTIN) 875-125 MG per tablet 1 tablet  Status:  Discontinued       ? 1 tablet Oral Every 12 hours 04/21/21 1946 04/22/21 1353  ? 04/21/21 1700  ceFEPIme (MAXIPIME) 2 g in sodium chloride 0.9 % 100 mL IVPB       ? 2 g ?200 mL/hr over 30 Minutes Intravenous  Once 04/21/21 1650 04/21/21 1829  ? 04/21/21 1700  vancomycin (VANCOREADY) IVPB 2000 mg/400 mL       ? 2,000 mg ?200 mL/hr over 120 Minutes Intravenous  Once 04/21/21 1650 04/21/21 2024  ? ?  ? ? ?Objective: ?Vitals:  ? 04/23/21 0845 04/23/21 1428  ?BP:  (!) 157/81  ?Pulse:  (!) 58  ?Resp: 16   ?Temp:  (!) 97.5 ?F (36.4 ?C)  ?SpO2:  97%  ? ?No intake or output data in the 24 hours ending 04/23/21 1606 ? ?Filed Weights  ? 04/21/21 1515  ?Weight: 83.9 kg  ? ?Weight change:  ?Body mass index is 25.8 kg/m?.  ? ?Physical Exam: ?General exam: Pleasant, elderly Caucasian male.  Not in physical distress ?Skin: No rashes, lesions or ulcers. ?HEENT: Atraumatic, normocephalic, no obvious bleeding ?Lungs: Clear to auscultation bilaterally ?CVS: Regular rate and rhythm, normal ?GI/Abd  soft, nontender, nondistended, bowel sound present ?CNS: Alert, awake, oriented to place and person ?Psychiatry: Mood appropriate ?Extremities: No pedal edema, no calf redness ? ?Data Review: I have personally reviewe

## 2021-04-24 ENCOUNTER — Inpatient Hospital Stay (HOSPITAL_COMMUNITY)
Admission: RE | Admit: 2021-04-24 | Discharge: 2021-05-03 | DRG: 945 | Disposition: A | Discharge status: Other Acute Inpatient Hospital | Payer: Medicare Other | Source: Intra-hospital | Attending: Physical Medicine and Rehabilitation | Admitting: Physical Medicine and Rehabilitation

## 2021-04-24 ENCOUNTER — Encounter (HOSPITAL_COMMUNITY): Payer: Self-pay | Admitting: Physical Medicine and Rehabilitation

## 2021-04-24 DIAGNOSIS — S065XAD Traumatic subdural hemorrhage with loss of consciousness status unknown, subsequent encounter: Principal | ICD-10-CM

## 2021-04-24 DIAGNOSIS — Z951 Presence of aortocoronary bypass graft: Secondary | ICD-10-CM

## 2021-04-24 DIAGNOSIS — W109XXD Fall (on) (from) unspecified stairs and steps, subsequent encounter: Secondary | ICD-10-CM | POA: Diagnosis present

## 2021-04-24 DIAGNOSIS — D62 Acute posthemorrhagic anemia: Secondary | ICD-10-CM | POA: Diagnosis present

## 2021-04-24 DIAGNOSIS — I4891 Unspecified atrial fibrillation: Secondary | ICD-10-CM | POA: Diagnosis present

## 2021-04-24 DIAGNOSIS — E785 Hyperlipidemia, unspecified: Secondary | ICD-10-CM | POA: Diagnosis present

## 2021-04-24 DIAGNOSIS — I110 Hypertensive heart disease with heart failure: Secondary | ICD-10-CM | POA: Diagnosis present

## 2021-04-24 DIAGNOSIS — I10 Essential (primary) hypertension: Secondary | ICD-10-CM | POA: Diagnosis present

## 2021-04-24 DIAGNOSIS — I69822 Dysarthria following other cerebrovascular disease: Secondary | ICD-10-CM

## 2021-04-24 DIAGNOSIS — K921 Melena: Secondary | ICD-10-CM | POA: Diagnosis present

## 2021-04-24 DIAGNOSIS — D6859 Other primary thrombophilia: Secondary | ICD-10-CM | POA: Diagnosis present

## 2021-04-24 DIAGNOSIS — K922 Gastrointestinal hemorrhage, unspecified: Secondary | ICD-10-CM | POA: Diagnosis not present

## 2021-04-24 DIAGNOSIS — Z87891 Personal history of nicotine dependence: Secondary | ICD-10-CM

## 2021-04-24 DIAGNOSIS — I69892 Facial weakness following other cerebrovascular disease: Secondary | ICD-10-CM

## 2021-04-24 DIAGNOSIS — R443 Hallucinations, unspecified: Secondary | ICD-10-CM | POA: Diagnosis present

## 2021-04-24 DIAGNOSIS — I2510 Atherosclerotic heart disease of native coronary artery without angina pectoris: Secondary | ICD-10-CM | POA: Diagnosis present

## 2021-04-24 DIAGNOSIS — Z7951 Long term (current) use of inhaled steroids: Secondary | ICD-10-CM

## 2021-04-24 DIAGNOSIS — K5900 Constipation, unspecified: Secondary | ICD-10-CM | POA: Diagnosis present

## 2021-04-24 DIAGNOSIS — J84112 Idiopathic pulmonary fibrosis: Secondary | ICD-10-CM | POA: Diagnosis present

## 2021-04-24 DIAGNOSIS — M21372 Foot drop, left foot: Secondary | ICD-10-CM | POA: Diagnosis present

## 2021-04-24 DIAGNOSIS — I6201 Nontraumatic acute subdural hemorrhage: Secondary | ICD-10-CM | POA: Diagnosis not present

## 2021-04-24 DIAGNOSIS — F5104 Psychophysiologic insomnia: Secondary | ICD-10-CM | POA: Diagnosis present

## 2021-04-24 DIAGNOSIS — I5032 Chronic diastolic (congestive) heart failure: Secondary | ICD-10-CM | POA: Diagnosis present

## 2021-04-24 DIAGNOSIS — E876 Hypokalemia: Secondary | ICD-10-CM | POA: Diagnosis present

## 2021-04-24 DIAGNOSIS — I69898 Other sequelae of other cerebrovascular disease: Secondary | ICD-10-CM

## 2021-04-24 DIAGNOSIS — G9389 Other specified disorders of brain: Secondary | ICD-10-CM | POA: Diagnosis present

## 2021-04-24 DIAGNOSIS — E871 Hypo-osmolality and hyponatremia: Secondary | ICD-10-CM | POA: Diagnosis present

## 2021-04-24 DIAGNOSIS — I959 Hypotension, unspecified: Secondary | ICD-10-CM | POA: Diagnosis not present

## 2021-04-24 DIAGNOSIS — Z8616 Personal history of COVID-19: Secondary | ICD-10-CM | POA: Diagnosis not present

## 2021-04-24 DIAGNOSIS — E663 Overweight: Secondary | ICD-10-CM | POA: Diagnosis present

## 2021-04-24 DIAGNOSIS — S069X0S Unspecified intracranial injury without loss of consciousness, sequela: Secondary | ICD-10-CM | POA: Diagnosis not present

## 2021-04-24 DIAGNOSIS — S065X0S Traumatic subdural hemorrhage without loss of consciousness, sequela: Secondary | ICD-10-CM | POA: Diagnosis not present

## 2021-04-24 DIAGNOSIS — I951 Orthostatic hypotension: Secondary | ICD-10-CM | POA: Diagnosis not present

## 2021-04-24 DIAGNOSIS — S069X9S Unspecified intracranial injury with loss of consciousness of unspecified duration, sequela: Secondary | ICD-10-CM | POA: Diagnosis not present

## 2021-04-24 DIAGNOSIS — Z6826 Body mass index (BMI) 26.0-26.9, adult: Secondary | ICD-10-CM

## 2021-04-24 DIAGNOSIS — S069XAA Unspecified intracranial injury with loss of consciousness status unknown, initial encounter: Secondary | ICD-10-CM | POA: Diagnosis present

## 2021-04-24 DIAGNOSIS — Z79899 Other long term (current) drug therapy: Secondary | ICD-10-CM

## 2021-04-24 DIAGNOSIS — Z713 Dietary counseling and surveillance: Secondary | ICD-10-CM

## 2021-04-24 LAB — CBC WITH DIFFERENTIAL/PLATELET
Abs Immature Granulocytes: 0.17 10*3/uL — ABNORMAL HIGH (ref 0.00–0.07)
Basophils Absolute: 0 10*3/uL (ref 0.0–0.1)
Basophils Relative: 0 %
Eosinophils Absolute: 0 10*3/uL (ref 0.0–0.5)
Eosinophils Relative: 0 %
HCT: 32.9 % — ABNORMAL LOW (ref 39.0–52.0)
Hemoglobin: 11.6 g/dL — ABNORMAL LOW (ref 13.0–17.0)
Immature Granulocytes: 1 %
Lymphocytes Relative: 8 %
Lymphs Abs: 1 10*3/uL (ref 0.7–4.0)
MCH: 31 pg (ref 26.0–34.0)
MCHC: 35.3 g/dL (ref 30.0–36.0)
MCV: 88 fL (ref 80.0–100.0)
Monocytes Absolute: 1.2 10*3/uL — ABNORMAL HIGH (ref 0.1–1.0)
Monocytes Relative: 9 %
Neutro Abs: 10.6 10*3/uL — ABNORMAL HIGH (ref 1.7–7.7)
Neutrophils Relative %: 82 %
Platelets: 261 10*3/uL (ref 150–400)
RBC: 3.74 MIL/uL — ABNORMAL LOW (ref 4.22–5.81)
RDW: 12.6 % (ref 11.5–15.5)
WBC: 13 10*3/uL — ABNORMAL HIGH (ref 4.0–10.5)
nRBC: 0 % (ref 0.0–0.2)

## 2021-04-24 LAB — BASIC METABOLIC PANEL
Anion gap: 6 (ref 5–15)
BUN: 17 mg/dL (ref 8–23)
CO2: 26 mmol/L (ref 22–32)
Calcium: 8 mg/dL — ABNORMAL LOW (ref 8.9–10.3)
Chloride: 93 mmol/L — ABNORMAL LOW (ref 98–111)
Creatinine, Ser: 0.58 mg/dL — ABNORMAL LOW (ref 0.61–1.24)
GFR, Estimated: >60 mL/min (ref >60)
Glucose, Bld: 147 mg/dL — ABNORMAL HIGH (ref 70–99)
Potassium: 3.2 mmol/L — ABNORMAL LOW (ref 3.5–5.1)
Sodium: 125 mmol/L — ABNORMAL LOW (ref 135–145)

## 2021-04-24 MED ORDER — MELATONIN 3 MG PO TABS: 3.0000 mg | ORAL_TABLET | Freq: Every day | ORAL | 0 refills | Status: DC

## 2021-04-24 MED ORDER — TORSEMIDE 10 MG PO TABS
5.0000 mg | ORAL_TABLET | Freq: Every day | ORAL | Status: DC
  Administered 2021-04-25 – 2021-05-02 (×8): 5 mg via ORAL
  Filled 2021-04-24 (×9): qty 0.5

## 2021-04-24 MED ORDER — PROCHLORPERAZINE 25 MG RE SUPP: 12.5000 mg | Freq: Four times a day (QID) | RECTAL | Status: DC | PRN

## 2021-04-24 MED ORDER — SACUBITRIL-VALSARTAN 49-51 MG PO TABS
1.0000 | ORAL_TABLET | Freq: Two times a day (BID) | ORAL | Status: DC
  Administered 2021-04-24 – 2021-05-02 (×17): 1 via ORAL
  Filled 2021-04-24 (×18): qty 1

## 2021-04-24 MED ORDER — POTASSIUM CHLORIDE CRYS ER 20 MEQ PO TBCR
20.0000 meq | EXTENDED_RELEASE_TABLET | Freq: Two times a day (BID) | ORAL | Status: DC
  Administered 2021-04-24 – 2021-05-02 (×17): 20 meq via ORAL
  Filled 2021-04-24 (×17): qty 1

## 2021-04-24 MED ORDER — FLEET ENEMA 7-19 GM/118ML RE ENEM: 1.0000 | ENEMA | Freq: Once | RECTAL | Status: DC | PRN

## 2021-04-24 MED ORDER — ROSUVASTATIN CALCIUM 20 MG PO TABS
20.0000 mg | ORAL_TABLET | Freq: Every day | ORAL | Status: DC
  Administered 2021-04-24 – 2021-05-02 (×9): 20 mg via ORAL
  Filled 2021-04-24 (×9): qty 1

## 2021-04-24 MED ORDER — PROCHLORPERAZINE MALEATE 5 MG PO TABS: 5.0000 mg | ORAL_TABLET | Freq: Four times a day (QID) | ORAL | Status: DC | PRN

## 2021-04-24 MED ORDER — GUAIFENESIN ER 600 MG PO TB12: 1200.0000 mg | ORAL_TABLET | Freq: Two times a day (BID) | ORAL | Status: DC

## 2021-04-24 MED ORDER — BENZONATATE 100 MG PO CAPS
100.0000 mg | ORAL_CAPSULE | Freq: Two times a day (BID) | ORAL | Status: DC
  Administered 2021-04-24 – 2021-05-02 (×17): 100 mg via ORAL
  Filled 2021-04-24 (×17): qty 1

## 2021-04-24 MED ORDER — PANTOPRAZOLE SODIUM 40 MG IV SOLR
40.0000 mg | Freq: Two times a day (BID) | INTRAVENOUS | Status: DC
  Administered 2021-04-24 – 2021-04-25 (×2): 40 mg via INTRAVENOUS
  Filled 2021-04-24 (×2): qty 10

## 2021-04-24 MED ORDER — ISOSORBIDE DINITRATE 20 MG PO TABS
20.0000 mg | ORAL_TABLET | Freq: Three times a day (TID) | ORAL | Status: DC
  Administered 2021-04-24 – 2021-05-02 (×21): 20 mg via ORAL
  Filled 2021-04-24 (×27): qty 1

## 2021-04-24 MED ORDER — GUAIFENESIN ER 600 MG PO TB12
1200.0000 mg | ORAL_TABLET | Freq: Two times a day (BID) | ORAL | Status: DC
  Administered 2021-04-24 – 2021-05-02 (×17): 1200 mg via ORAL
  Filled 2021-04-24 (×17): qty 2

## 2021-04-24 MED ORDER — DIPHENHYDRAMINE HCL 12.5 MG/5ML PO ELIX: 12.5000 mg | ORAL_SOLUTION | Freq: Four times a day (QID) | ORAL | Status: DC | PRN

## 2021-04-24 MED ORDER — PROCHLORPERAZINE EDISYLATE 10 MG/2ML IJ SOLN: 5.0000 mg | Freq: Four times a day (QID) | INTRAMUSCULAR | Status: DC | PRN

## 2021-04-24 MED ORDER — TAMSULOSIN HCL 0.4 MG PO CAPS: 0.8000 mg | ORAL_CAPSULE | Freq: Every day | ORAL | Status: AC

## 2021-04-24 MED ORDER — GUAIFENESIN-DM 100-10 MG/5ML PO SYRP: 5.0000 mL | ORAL_SOLUTION | Freq: Four times a day (QID) | ORAL | Status: DC | PRN

## 2021-04-24 MED ORDER — HYDRALAZINE HCL 25 MG PO TABS
25.0000 mg | ORAL_TABLET | Freq: Three times a day (TID) | ORAL | Status: DC
  Administered 2021-04-24 – 2021-05-01 (×18): 25 mg via ORAL
  Filled 2021-04-24 (×22): qty 1

## 2021-04-24 MED ORDER — POTASSIUM CHLORIDE CRYS ER 20 MEQ PO TBCR
40.0000 meq | EXTENDED_RELEASE_TABLET | Freq: Once | ORAL | Status: AC
  Administered 2021-04-24: 40 meq via ORAL
  Filled 2021-04-24: qty 2

## 2021-04-24 MED ORDER — DOCUSATE SODIUM 100 MG PO CAPS
100.0000 mg | ORAL_CAPSULE | Freq: Every day | ORAL | Status: DC
  Administered 2021-04-24 – 2021-05-02 (×9): 100 mg via ORAL
  Filled 2021-04-24 (×9): qty 1

## 2021-04-24 MED ORDER — DOCUSATE SODIUM 100 MG PO CAPS
100.0000 mg | ORAL_CAPSULE | Freq: Every day | ORAL | Status: DC
Start: 2021-04-25 — End: 2021-04-24

## 2021-04-24 MED ORDER — SODIUM CHLORIDE 1 G PO TABS
1.0000 g | ORAL_TABLET | Freq: Three times a day (TID) | ORAL | Status: DC
  Administered 2021-04-24 – 2021-04-30 (×17): 1 g via ORAL
  Filled 2021-04-24 (×16): qty 1

## 2021-04-24 MED ORDER — TORSEMIDE 10 MG PO TABS
5.0000 mg | ORAL_TABLET | Freq: Every day | ORAL | Status: DC
  Administered 2021-04-24: 5 mg via ORAL
  Filled 2021-04-24: qty 0.5

## 2021-04-24 MED ORDER — FAMOTIDINE 20 MG PO TABS
20.0000 mg | ORAL_TABLET | Freq: Two times a day (BID) | ORAL | Status: DC
  Administered 2021-04-24 – 2021-05-02 (×17): 20 mg via ORAL
  Filled 2021-04-24 (×17): qty 1

## 2021-04-24 MED ORDER — EZETIMIBE 10 MG PO TABS
10.0000 mg | ORAL_TABLET | Freq: Every day | ORAL | Status: DC
  Administered 2021-04-25 – 2021-05-02 (×8): 10 mg via ORAL
  Filled 2021-04-24 (×8): qty 1

## 2021-04-24 MED ORDER — MELATONIN 3 MG PO TABS
3.0000 mg | ORAL_TABLET | Freq: Every day | ORAL | Status: DC
  Administered 2021-04-24 – 2021-05-02 (×9): 3 mg via ORAL
  Filled 2021-04-24 (×9): qty 1

## 2021-04-24 MED ORDER — ALUM & MAG HYDROXIDE-SIMETH 200-200-20 MG/5ML PO SUSP
30.0000 mL | ORAL | Status: DC | PRN
Start: 2021-04-24 — End: 2021-05-03

## 2021-04-24 MED ORDER — FLUTICASONE FUROATE-VILANTEROL 100-25 MCG/ACT IN AEPB
1.0000 | INHALATION_SPRAY | Freq: Every day | RESPIRATORY_TRACT | Status: DC
  Administered 2021-04-25 – 2021-05-02 (×8): 1 via RESPIRATORY_TRACT
  Filled 2021-04-24: qty 28

## 2021-04-24 MED ORDER — ACETAMINOPHEN 325 MG PO TABS
325.0000 mg | ORAL_TABLET | ORAL | Status: DC | PRN
  Administered 2021-05-01: 650 mg via ORAL
  Filled 2021-04-24: qty 2

## 2021-04-24 MED ORDER — BISACODYL 10 MG RE SUPP: 10.0000 mg | Freq: Every day | RECTAL | Status: DC | PRN

## 2021-04-24 MED ORDER — DOCUSATE SODIUM 100 MG PO CAPS: 100.0000 mg | ORAL_CAPSULE | Freq: Every day | ORAL | Status: DC

## 2021-04-24 MED ORDER — BENZONATATE 100 MG PO CAPS
100.0000 mg | ORAL_CAPSULE | Freq: Two times a day (BID) | ORAL | 0 refills | Status: DC
Start: 2021-04-24 — End: 2021-05-23

## 2021-04-24 MED ORDER — POLYETHYLENE GLYCOL 3350 17 G PO PACK
17.0000 g | PACK | Freq: Every day | ORAL | Status: DC | PRN
  Filled 2021-04-24: qty 1

## 2021-04-24 MED ORDER — TAMSULOSIN HCL 0.4 MG PO CAPS
0.8000 mg | ORAL_CAPSULE | Freq: Every day | ORAL | Status: DC
  Administered 2021-04-25 – 2021-05-02 (×8): 0.8 mg via ORAL
  Filled 2021-04-24 (×8): qty 2

## 2021-04-24 MED ORDER — TRAZODONE HCL 50 MG PO TABS
25.0000 mg | ORAL_TABLET | Freq: Every evening | ORAL | Status: DC | PRN
  Administered 2021-04-28: 50 mg via ORAL
  Filled 2021-04-24 (×2): qty 1

## 2021-04-24 NOTE — Progress Notes (Signed)
Mobility Specialist Progress Note: ? ? 04/24/21 1444  ?Mobility  ?Activity Refused mobility  ? ?Pt very tired and wanting to rest before going to rehab.  ? ?Calvin Brown ?Mobility Specialist ?Primary Phone [phone]` ? ?

## 2021-04-24 NOTE — Discharge Summary (Signed)
? ?Physician Discharge Summary  ?Drennan Bussiere [id number] DOB: Aug 16, 1933 DOA: 04/21/2021 ? ?PCP: Sueanne Margarita, DO ? ?Admit date: 04/21/2021 ?Discharge date: 04/24/2021 ? ?Admitted From: Home ?Discharge disposition: CIR ? ?Recommendations at discharge:  ?Recommend to stop Xarelto at least for a week now.  To discuss with cardiology as an outpatient for further plan ? ? ?Brief narrative: ?Yurem Smutny is a 88 y.o. male with PMH significant for stroke with ambulatory function, A-fib on Xarelto, CAD/CABG, GI bleed secondary to gastric AVM s/p cauterization 123456, chronic diastolic CHF, pulmonary fibrosis, HTN, chronic constipation. ?Patient was brought to ED from home with melena and unsteady gait. ?Patient had COVID 3 weeks ago and was treated with 4 days of antibiotic treatment.  Last week, patient had an episode of fall [place] and hit his head.  He did not pass out or feel any weakness or numbness at the time.  He was seen in the ED.  CT head was negative.   ?X-ray showed right-sided pneumonia and hence patient was started on a course of Augmentin and doxycycline.  His weakness, cough improved but over the course of next few days, but patient started having nightmares, auditory and visual hallucinations.  Family also noted slurred speech, sensitivity to light and urinary incontinence. ?Family also noted him unsteady.   ?3/25, patient started having black stool without any abdominal pain or passing bright red blood and hence came to the ED. ? ?In the ED, patient was afebrile, hemodynamically stable ?FOBT positive.  Hemoglobin 11.6. ?CT scan of head showed interval development of an epidural fluid collection overlying the left cerebral hemisphere likely reflecting subacute blood products.  No internal brain herniation. ?Neurosurgery recommended conservative management. ?Admitted to hospitalist service. ? ?Subjective: ?Patient was seen and examined this morning.  Elderly Caucasian male.  Sitting  up in chair.  Not in distress.  No new symptoms.  States that he is stool was tarry black yesterday morning but started to become light brown this morning. ?Wife at bedside. ? ?Principal Problem: ?  Lower GI bleed ?Active Problems: ?  Atrial fibrillation ([place]) ?  Abnormal gait ?  IPF (idiopathic pulmonary fibrosis) ([place]) ?  Epidural hematoma ?  ? ?Assessment and Plan: ?Subacute epidural hematoma ?-Likely secondary to the fall 6 days prior to presentation. ?-No focal neurological deficits. ?-Repeat CT scan of head today did not show any new change. ?-Neurosurgery recommended to stop Xarelto.  Patient agrees to hold it for few days but is concerned about stroke from A-fib in the long-term.  I would hold it at least for a week.  Recommended to follow-up with cardiology as an outpatient. ?  ?Lower GI bleed/melena ?History of gastric AVM ?-Reported soft black stool.  Hemoglobin was 11.6 yesterday slightly less then few days ago.  [place]/bright red blood or hematemesis at this time. ?-Continue to monitor hemoglobin. ?-Patient reports that in 2015, he had hematemesis with a drop in hemoglobin down to 6.  EGD showed AVM which was cauterized.  He states he was taking Xarelto and NSAIDs at that time.  He stopped NSAID for some time but resumed again for gout. ?-As of now, Xarelto on hold.  NSAID discouraged as well.  Remains on PPI. ?-Hemoglobin stable.  Patient may continue to have melena for next few episodes. ?-Regular diet started this morning.  Discussed with GI Dr. Collene Mares on 3/27. ?-If patient starts to have [place] bleeding or starts dropping hemoglobin, may need formal GI consultation. ?Recent Labs  ?  04/21/21 ?1527 04/21/21 ?2203 04/22/21 ?0129 04/23/21 ?0149 04/24/21 ?0059  ?HGB 11.6* 11.7* 11.1* 11.9* 11.6*  ?MCV 92.2  --  89.8 88.6 88.0  ? ?Acute hyponatremia ?-Sodium level trending down, 125 today.  Probably because of n.p.o. status for 2 days.  Back on regular diet now.  ?-Labs ordered for urine studies including  urine osmolality as well. ?-Can follow-up with GI. ?Recent Labs  ?Lab 04/21/21 ?1527 04/22/21 ?0129 04/23/21 ?0149 04/24/21 ?0059  ?NA 131* 131* 126* 125*  ? ?Chronic diastolic CHF ?Essential hypertension ?-Home meds include Entresto 49/51 mg twice daily, hydralazine 25 mg 3 times daily, Isordil 20 mg 3 times daily, torsemide 5 mg daily. ?-All of them have been resumed. ? ?Chronic A-fib ?-Not on AV nodal blocking agent. ?-PTA, patient was on Xarelto which is currently on hold because of GI bleeding ? ?CAD/CABG ?HLD ?-Continue Zetia, Crestor.  Xarelto on hold ? ? ? ?Unsteadiness, hallucination ?History of stroke with ambulatory dysfunction ?-CT head did not show any new stroke but patient was noted to have epidural hematoma with exerts regional mass effect.  There is also mild cerebral volume loss with associated ex vacuo dilatation.  He also has encephalomalacia of the right frontal lobe.  ?-Patient may have had intermittent nonspecific neurological symptoms related to the CT head finding. ?-Continue to monitor mental status change.  Avoid sedatives. ?-PT evaluation obtained.  CIR recommended. ? ?Postviral pneumonia ?-Completed a course of oral Augmentin and doxycycline. ?-WBC count, lactic acid level normal. ?Recent Labs  ?Lab 04/21/21 ?1450 04/21/21 ?1527 04/21/21 ?2203 04/22/21 ?0129 04/23/21 ?0149 04/24/21 ?0059  ?WBC  --  9.9  --  10.1 11.7* 13.0*  ?LATICACIDVEN 1.1  --  1.0  --   --   --   ? ?BPH ?Urinary incontinence ?-Was in Flomax 0.4 mg daily.  Because of persistent nocturia and frequency, I increased it to 0.8 mg daily  ? ?Goals of care ?  Code Status: Full Code  ? ? ?Mobility: Encourage ambulation.  PT eval obtained. ? ? ? ?Wounds:  ?- ?  ? ?Discharge Exam:  ? ?Vitals:  ? 04/24/21 0118 04/24/21 [id number] 04/24/21 0751 04/24/21 0849  ?BP: 131/83 134/78 137/82   ?Pulse: 81 89 84 82  ?Resp: 19 18 16 18   ?Temp: 98.4 ?F (36.9 ?C) 97.9 ?F (36.6 ?C) 97.9 ?F (36.6 ?C)   ?TempSrc:  Oral    ?SpO2: 95% 94% 95% 95%   ?Weight:      ?Height:      ? ? ?Body mass index is 25.8 kg/m?.  ?General exam: Pleasant, elderly Caucasian male.  Not in physical distress  ?Skin: No rashes, lesions or ulcers. ?HEENT: Atraumatic, normocephalic, no obvious bleeding ?Lungs: Clear to auscultation bilaterally ?CVS: Regular rate and rhythm, normal ?GI/Abd soft, nontender, nondistended, bowel sound present ?CNS: Alert, awake, oriented to place and person ?Psychiatry: Mood appropriate ?Extremities: No pedal edema, no calf redness ? ?Follow ups:  ? ? Follow-up Information   ? ? Sueanne Margarita, DO Follow up.   ?Specialty: Internal Medicine ?Contact information: ?[address] ?[place] [address] ?[phone] ? ? ?  ?  ? ?  ?  ? ?  ? ? ?Discharge Instructions:  ? ?Discharge Instructions   ? ? Call MD for:  difficulty breathing, headache or visual disturbances   Complete by: As directed ?  ? Call MD for:  extreme fatigue   Complete by: As directed ?  ? Call MD for:  hives   Complete by: As  directed ?  ? Call MD for:  persistant dizziness or light-headedness   Complete by: As directed ?  ? Call MD for:  persistant nausea and vomiting   Complete by: As directed ?  ? Call MD for:  severe uncontrolled pain   Complete by: As directed ?  ? Call MD for:  temperature >100.4   Complete by: As directed ?  ? Diet general   Complete by: As directed ?  ? Discharge instructions   Complete by: As directed ?  ? Recommendations at discharge:  ?? Recommend to stop Xarelto at least for a week now.  To discuss with cardiology as an outpatient for further plan ? ?Discharge instructions for CHF ?Check weight daily -preferably same time every day. ?Restrict fluid intake to 1200 ml daily ?Restrict salt intake to less than 2 g daily. ?Call MD if you have one of the following symptoms ?1) 3 pound weight gain in 24 hours or 5 pounds in 1 week  ?2) swelling in the hands, feet or stomach  ?3) progressive shortness of breath ?4) if you have to sleep on extra pillows at night in order  to breathe ? ? ? ? ?General discharge instructions: ?Follow with Primary MD Sueanne Margarita, DO in 7 days  ?Please request your PCP  to go over your hospital tests, procedures, radiology results at the follo

## 2021-04-24 NOTE — H&P (Incomplete)
? ? ?Physical Medicine and Rehabilitation Admission H&P ? ?  ?Chief Complaint  ?Patient presents with  ? Functional deficits   ? ? 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 He was found to have large hematoma over occiput and initially confused with reports of new cough X 3 days.  CT head negative for bleed and CXR showed RML opacity concerning for PNA.  He declined overnight stay, was given dose of IV antibiotics and discharged to home on Doxycycline and Augmentin.  He was readmitted on 03/26 with reports of fatigue with intermittent hallucinations, slurred speech, bedwetting at nights as well as melena. He was found to have interval development of epidural collection overlying left hemisphere felt to be subacute as well as heme positive stool with mild drop in H/H.  ? ?Dr. Ronnald Ramp was consulted for input and a repeat CT stable no intervention needed and recommended stopping Xarelto. He was placed on full liquid diet and made NPO. Case was discussed with Dr. Collene Mares who recommended d/c of NSAIDs (which patient was taking for gout flare) and to monitor for [place] bleeding or drop in H/H. No work up  needed as H/H stable and regular diet resumed. Patient has had multiple concerns re: d/c of DOAC and was recommended to follow up w/cardiology for input after discharge. He has had issues with intermittent bradycardia during sleep with HR in 39-40 range. He has had progressive drop in sodium to 125 with hypokalemia and rise in WBC to 13.0 but H/H has been stable. Bedside swallow without signs of dysphagia. He continues to have issues with confusion/hallucinations at nights requiring Telesitter for safety.  He continues to be limited by balance  deficits with decreased safety awareness, delay in processing with increased time to follow simple one step commands. CIR recommended due to functional decline.  ? ? ?Review of Systems  ?Constitutional:  Negative for chills and fever.  ?HENT:  Positive for hearing loss and tinnitus.   ?Eyes:  Negative for blurred vision and double vision.  ?Respiratory:  Positive for cough (getting better).   ?Cardiovascular:  Negative for chest pain.  ?Gastrointestinal:  Negative for constipation, heartburn and nausea.  ?     Reports black stool yesterday.   ?Genitourinary:  Negative for dysuria.  ?Musculoskeletal:  Positive for falls (falls once a week--loses balance).  ?Neurological:  Positive for dizziness, weakness and headaches.  ?Psychiatric/Behavioral:  Positive for hallucinations (two episodes yesterday--patient aware.).   ?     Having severe nightmares  ? ? ?Past Medical History:  ?Diagnosis Date  ? Atrial fibrillation ([place])   ? CAD (coronary artery disease)   ? CVA (cerebral vascular accident) ([place]) 05/27/2013  ? GI bleed 05/27/2013  ? HTN (hypertension)   ? Hx of CABG   ? Possibly 2015?  ? Hyperlipidemia   ? ? ?Past Surgical History:  ?Procedure Laterality Date  ? CORONARY ARTERY BYPASS GRAFT  1992  ? ESOPHAGOGASTRODUODENOSCOPY    ? TONSILLECTOMY AND ADENOIDECTOMY    ? ? ?Family History  ?Problem Relation Age of Onset  ? Vision loss Mother   ? Breast cancer Mother 39  ? Hypertension Father   ? Heart attack Sister 80  ? ? ?  Social History:   Married. 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 Has been slowing down since PNA prior to thanksgiving.  He reports that he quit smoking about 54 years ago. His smoking use included cigarettes. He started smoking about 66 years ago. He has a 24.00 pack-year smoking history. He has never used smokeless tobacco. He reports that he does not currently use alcohol. He reports that he does not use drugs. ? ?Allergies: No Known Allergies ? ? ?Medications Prior to Admission   ?Medication Sig Dispense Refill  ? amoxicillin-clavulanate (AUGMENTIN) 875-125 MG tablet Take 1 tablet by mouth every 12 (twelve) hours. 14 tablet 0  ? BREO ELLIPTA 100-25 MCG/ACT AEPB TAKE 1 PUFF BY MOUTH EVERY DAY (Patient taking differently: Inhale 1 puff into the lungs daily.) 60 each 2  ? calcium carbonate (CALCIUM 600) 600 MG TABS tablet Take 600 mg by mouth 2 (two) times daily with a meal.    ? Cholecalciferol (VITAMIN D) 125 MCG (5000 UT) CAPS Take 5,000 Units by mouth daily.    ? doxycycline (VIBRAMYCIN) 100 MG capsule Take 1 capsule (100 mg total) by mouth 2 (two) times daily. 20 capsule 0  ? ENTRESTO 49-51 MG TAKE 1 TABLET BY MOUTH TWICE A DAY (Patient taking differently: Take 1 tablet by mouth 2 (two) times daily.) 60 tablet 0  ? ezetimibe (ZETIA) 10 MG tablet Take 10 mg by mouth daily.    ? famotidine (PEPCID) 20 MG tablet Take 20 mg by mouth 2 (two) times daily.    ? hydrALAZINE (APRESOLINE) 25 MG tablet TAKE 1 TABLET BY MOUTH THREE TIMES A DAY (Patient taking differently: Take 25 mg by mouth 3 (three) times daily.) 270 tablet 0  ? ibandronate (BONIVA) 150 MG tablet Take 150 mg by mouth every 30 (thirty) days. Saturday    ? isosorbide dinitrate (ISORDIL) 20 MG tablet TAKE 1 TABLET BY MOUTH THREE TIMES A DAY (Patient taking differently: Take 20 mg by mouth 3 (three) times daily.) 270 tablet 0  ? Misc Natural Products (MENS PROSTATE HEALTH FORMULA PO) Take 2 tablets by mouth daily.    ? Multiple Vitamin (MULTIVITAMIN) capsule Take 1 capsule by mouth daily.    ? omeprazole (PRILOSEC OTC) 20 MG tablet Take 20 mg by mouth daily.    ? Potassium 99 MG TABS Take 198 mg by mouth daily.    ? rivaroxaban (XARELTO) 20 MG TABS tablet Take 1 tablet (20 mg total) by mouth daily with supper. (Patient taking differently: Take 15 mg by mouth daily with supper.) 90 tablet 0  ? rosuvastatin (CRESTOR) 20 MG tablet Take 1 tablet (20 mg total) by mouth at bedtime for 90 doses.    ? tamsulosin (FLOMAX) 0.4 MG CAPS capsule  Take 0.4 mg by mouth daily.    ? torsemide (DEMADEX) 5 MG tablet TAKE 1 TABLET BY MOUTH EVERY DAY IN THE MORNING (Patient taking differently: Take 5 mg by mouth daily.) 30 tablet 0  ? ? ? ? ?Home: ?Home Living ?Family/patient expects to be discharged to:: Private residence ?Living Arrangements: Spouse/significant other ?Available Help at Discharge: Family, Available 24 hours/day ?Type of Home: House ?Home Access: Stairs to enter ?Entrance Stairs-Number of Steps: 3 ?Entrance Stairs-Rails: Left ?Home Layout: One level ?Bathroom Toilet: Standard ?Home Equipment: Gilmer Mor - single point, Agricultural consultant (2 wheels) Lexicographer) ?Additional Comments: used SPC in R hand and L hand on rail to enter house ?  ?Functional History: ?Prior Function ?Prior Level of Function : History of Falls (last  six months) ?Mobility Comments: requires hands on him as well as AD ? ?Functional Status:  ?Mobility: ?Bed Mobility ?Overal bed mobility: Needs Assistance ?Bed Mobility: Supine to Sit ?Supine to sit: Supervision ?Sit to supine: Min assist ?General bed mobility comments: scooting up in bed 2 mod assist ?Transfers ?Overall transfer level: Needs assistance ?Equipment used: Rolling walker (2 wheels) ?Transfers: Sit to/from Stand, Bed to chair/wheelchair/BSC ?Sit to Stand: Min guard ?General transfer comment: min guard for safety, cues for hand placement especially for controlled descent ?Ambulation/Gait ?Ambulation/Gait assistance: Min assist ?Gait Distance (Feet): 120 Feet ?Assistive device: Rolling walker (2 wheels), Quad cane ?Gait Pattern/deviations: Step-through pattern, Decreased stride length, Trunk flexed, Drifts right/left ?General Gait Details: pt with noted unsteadiness with quad cane drifting R/L and scissoring feet, pt requesting use as is his baseline, swtiched to RW for 90' with noted increase in stability, no drifting, pt in agreement that RW most appropriate AD at this time. ?Gait velocity: reduced ?Gait velocity interpretation:  <1.31 ft/sec, indicative of household ambulator ?Pre-gait activities: standing balance cues with lateral corrections ?  ? ?ADL: ?  ? ?Cognition: ?Cognition ?Overall Cognitive Status: Impaired/Different from

## 2021-04-24 NOTE — Progress Notes (Incomplete)
[name] to be D/C'd  per MD order.  Discussed with the patient and all questions fully answered. ? ?VSS, Skin clean, dry and intact without evidence of skin break down, no evidence of skin tears noted. ? ?IV catheter discontinued intact. Site without signs and symptoms of complications. Dressing and pressure applied. ? ?Patient will travel with his belongings.  ? ?Gave report to Autumn on [facility]. ? ?

## 2021-04-24 NOTE — Progress Notes (Signed)
Inpatient Rehabilitation Admission Medication Review by a Pharmacist ? ?A complete drug regimen review was completed for this patient to identify any potential clinically significant medication issues. ? ?High Risk Drug Classes Is patient taking? Indication by Medication  ?Antipsychotic Yes Compazine prn N/V  ?Anticoagulant No   ?Antibiotic No   ?Opioid No   ?Antiplatelet No   ?Hypoglycemics/insulin No   ?Vasoactive Medication Yes Hydralazine, Isordil, Entresto, Demadex for CHF  ?Chemotherapy No   ?Other Yes Zetia, Crestor for HLD ?Flomax for BPH, incontinence ?Pepcid, Protonix for GI bleed ?Benzonatate, Guaifenesin, Breo for COPD  ? ? ? ?Type of Medication Issue Identified Description of Issue Recommendation(s)  ?Drug Interaction(s) (clinically significant) ?    ?Duplicate Therapy ?    ?Allergy ?    ?No Medication Administration End Date ?    ?Incorrect Dose ?    ?Additional Drug Therapy Needed ?    ?Significant med changes from prior encounter (inform family/care partners about these prior to discharge).    ?Other ?    ? ? ?Clinically significant medication issues were identified that warrant physician communication and completion of prescribed/recommended actions by midnight of the next day:  No ? ?Pharmacist comments: None ? ?Time spent performing this drug regimen review (minutes):  20 minutes ? ? ?Elwin Sleight ?04/24/2021 3:40 PM ?

## 2021-04-24 NOTE — Progress Notes (Signed)
Telemetry called HR dropped 39 but not sustained went back to 81. Md previously aware heart rate dropping.  ?

## 2021-04-24 NOTE — Progress Notes (Signed)
Inpatient Rehab Admissions Coordinator:  ? ?I have a bed for this patient to admit to CIR today.  Dr. Pola Corn in agreement.  I will let pt/family and TOC team know.   ? ?Estill Dooms, PT, DPT ?Admissions Coordinator ?[phone] ?04/24/21  ?9:47 AM ? ?

## 2021-04-24 NOTE — Progress Notes (Signed)
Calvin Brown to be D/C'd  per MD order.  Discussed with the patient and all questions fully answered. ? ?VSS, Skin clean, dry and intact without evidence of skin break down, no evidence of skin tears noted. ? ?IV catheter discontinued intact. Site without signs and symptoms of complications. Dressing and pressure applied. ? ?Patient will travel with his belongings.  ? ?Gave report to Autumn on [facility]. ? ?

## 2021-04-24 NOTE — Progress Notes (Signed)
PMR Admission Coordinator Pre-Admission Assessment ?  ?Patient: Calvin Brown is an 88 y.o., male ?MRN: [id number] ?DOB: 26-Aug-1933 ?Height: 5\' 11"  (180.3 cm) ?Weight: 83.9 kg ?  ?Insurance Information ?HMO:     PPO:      PCP:      IPA:      80/20:      OTHER:  ?PRIMARY: Medicare A/B      Policy#: [id number]      Subscriber: pt ?CM Name:       Phone#:      Fax#:  ?Pre-Cert#: verified online      Employer:  ?Benefits:  Phone #:      Name:  ?Eff. Date: 04/27/00 A/B     Deduct: $1600      Out of Pocket Max: n/a      Life Max: n/a ?CIR: 100%      SNF: 20 full days ?Outpatient: 80%     Co-Ins: 20% ?Home Health: 100%      Co-Pay:  ?DME: 80%     Co-Ins: 20% ?Providers:  ?SECONDARY: Commercial Supplement      Policy#: [id number]     Phone#:  ?  ?Financial Counselor:       Phone#:  ?  ?The ?Data Collection Information Summary? for patients in Inpatient Rehabilitation Facilities with attached ?Privacy Act [place] Records? was provided and verbally reviewed with: Patient ?  ?Emergency Contact Information ?Contact Information   ?  ?  Name Relation Home Work Mobile  ?  Danzy,VICKY Spouse     [phone]  ?  ?   ?  ?  ?Current Medical History  ?Patient Admitting Diagnosis: Epidural hematoma, GI Bleed ?  ?History of Present Illness: Pt is a 88 y/o male with PMH of CVA, a-fib on Xarelto, CAD/CABG, GI Bleed 2/2 AVM s/p cauterization in [id number], chronic diastolic CHF, pulmonary fibrosis, and HTN, admitted to ED with AMS, unsteady gait, and melena on 04/21/21.  One week prior to admit pt had a fall and was cleared in the ED with negative head CT.  Pt started having nightmares, auditory, and visual hallucinations and family also noted slurred speech, sensitivity to lights, and urinary incontinence, in addition to black stool without BRBPR.  In ED pt was afebrile and hemodynamically stable.  FOBT positive, Hgb 11.6.  CT head showed interval developed of an epidural hematoma overlying th left cerebral hemisphere.  No brain  herniation and neurosurgery recommended conservative management and stop xarelto at least 1 week.  Recommend f/u with outpatient cardiology to resume.  Pt's hemoglobin remained stable without Tieu/bright red blood or hematemesis.  Pt tolerating a regular diet.  If pt develops Ever[place] bleeding or shows drop in hemoglobin may consult GI.  Therapy evaluations completed and pt was recommended for CIR.    ?  ?Complete NIHSS TOTAL: 1 ?  ?Patient's medical record from [name] has been reviewed by the rehabilitation admission coordinator and physician. ?  ?Past Medical History  ?    ?Past Medical History:  ?Diagnosis Date  ? Atrial fibrillation ([place])    ? CAD (coronary artery disease)    ? CVA (cerebral vascular accident) ([place]) 05/27/2013  ? GI bleed 05/27/2013  ? HTN (hypertension)    ? Hx of CABG    ?  Possibly 2015?  ? Hyperlipidemia    ?  ?  ?Has the patient had major surgery during 100 days prior to admission? No ?  ?Family History   ?family history includes Breast cancer (age  of onset: 65) in his mother; Heart attack (age of onset: 26) in his sister; Hypertension in his father; Vision loss in his mother. ?  ?Current Medications ?  ?Current Facility-Administered Medications:  ?  benzonatate (TESSALON) capsule 100 mg, 100 mg, Oral, BID, Wynetta Fines T, MD, 100 mg at 04/24/21 [id number] ?  ezetimibe (ZETIA) tablet 10 mg, 10 mg, Oral, Daily, Wynetta Fines T, MD, 10 mg at 04/24/21 [id number] ?  famotidine (PEPCID) tablet 20 mg, 20 mg, Oral, BID, Wynetta Fines T, MD, 20 mg at 04/24/21 [id number] ?  fluticasone furoate-vilanterol (BREO ELLIPTA) 100-25 MCG/ACT 1 puff, 1 puff, Inhalation, Daily, Lequita Halt, MD, 1 puff at 04/24/21 0848 ?  guaiFENesin (MUCINEX) 12 hr tablet 1,200 mg, 1,200 mg, Oral, BID, Wynetta Fines T, MD, 1,200 mg at 04/24/21 [id number] ?  hydrALAZINE (APRESOLINE) tablet 10 mg, 10 mg, Oral, Q6H PRN, Wynetta Fines T, MD ?  hydrALAZINE (APRESOLINE) tablet 25 mg, 25 mg, Oral, TID, Dahal, Binaya, MD, 25 mg at 04/24/21 [id number] ?  isosorbide  dinitrate (ISORDIL) tablet 20 mg, 20 mg, Oral, TID, Dahal, Binaya, MD, 20 mg at 04/24/21 0951 ?  melatonin tablet 3 mg, 3 mg, Oral, QHS, Wynetta Fines T, MD, 3 mg at 04/23/21 2155 ?  pantoprazole (PROTONIX) injection 40 mg, 40 mg, Intravenous, Q12H, Wynetta Fines T, MD, 40 mg at 04/24/21 0954 ?  rosuvastatin (CRESTOR) tablet 20 mg, 20 mg, Oral, QHS, Wynetta Fines T, MD, 20 mg at 04/23/21 2155 ?  sacubitril-valsartan (ENTRESTO) 49-51 mg per tablet, 1 tablet, Oral, BID, Lequita Halt, MD, 1 tablet at 04/24/21 [phone] ?  tamsulosin (FLOMAX) capsule 0.8 mg, 0.8 mg, Oral, Daily, Dahal, Binaya, MD, 0.8 mg at 04/24/21 0952 ?  torsemide (DEMADEX) tablet 5 mg, 5 mg, Oral, Daily, Dahal, Binaya, MD, 5 mg at 04/24/21 0957 ?  ?Patients Current Diet:  ?Diet Order   ?  ?         ?    Diet general       ?  ?    Diet regular Room service appropriate? Yes; Fluid consistency: Thin  Diet effective now       ?  ?  ?   ?  ?  ?   ?  ?  ?Precautions / Restrictions ?Precautions ?Precautions: Fall ?Precaution Comments: monitor HR and sats ?Restrictions ?Weight Bearing Restrictions: No  ?  ?Has the patient had 2 or more falls or a fall with injury in the past year? Yes ?  ?Prior Activity Level ?Limited Community (1-2x/wk): was occasionally going out for walks, using quad cane for mobility, not driving ?  ?Prior Functional Level ?Self Care: Did the patient need help bathing, dressing, using the toilet or eating? Independent ?  ?Indoor Mobility: Did the patient need assistance with walking from room to room (with or without device)? Independent ?  ?Stairs: Did the patient need assistance with internal or external stairs (with or without device)? Independent ?  ?Functional Cognition: Did the patient need help planning regular tasks such as shopping or remembering to take medications? Independent ?  ?Patient Information ?Are you of Hispanic, Latino/a,or Spanish origin?: A. No, not of Hispanic, Latino/a, or Spanish origin ?What is your race?: A. White ?Do  you need or want an interpreter to communicate with a doctor or health care staff?: 0. No ?  ?Patient's Response To:  ?Health Literacy and Transportation ?Is the patient able to respond to health literacy and transportation needs?: Yes ?Health Literacy - How often  do you need to have someone help you when you read instructions, pamphlets, or other written material from your doctor or pharmacy?: Never ?In the past 12 months, has lack of transportation kept you from medical appointments or from getting medications?: No ?In the past 12 months, has lack of transportation kept you from meetings, work, or from getting things needed for daily living?: No ?  ?Home Assistive Devices / Equipment ?Home Assistive Devices/Equipment: None ?Home Equipment: Kasandra Knudsen - single point, Conservation officer, nature (2 wheels) Management consultant) ?  ?Prior Device Use: Indicate devices/aids used by the patient prior to current illness, exacerbation or injury?  Cane ?  ?Current Functional Level ?Cognition ?  Overall Cognitive Status: Impaired/Different from baseline ?Current Attention Level: Selective ?Orientation Level: Oriented X4 ?Following Commands: Follows one step commands with increased time ?Safety/Judgement: Decreased awareness of deficits, Decreased awareness of safety ?   ?Extremity Assessment ?(includes Sensation/Coordination) ?  Upper Extremity Assessment: Overall WFL for tasks assessed  ?Lower Extremity Assessment: Overall WFL for tasks assessed  ?   ?ADLs ?     ?   ?Mobility ?  Overal bed mobility: Needs Assistance ?Bed Mobility: Supine to Sit ?Supine to sit: Supervision ?Sit to supine: Min assist ?General bed mobility comments: scooting up in bed 2 mod assist  ?   ?Transfers ?  Overall transfer level: Needs assistance ?Equipment used: Rolling walker (2 wheels) ?Transfers: Sit to/from Stand, Bed to chair/wheelchair/BSC ?Sit to Stand: Min guard ?General transfer comment: min guard for safety, cues for hand placement especially for controlled descent  ?    ?Ambulation / Gait / Stairs / Wheelchair Mobility ?  Ambulation/Gait ?Ambulation/Gait assistance: Min assist ?Gait Distance (Feet): 120 Feet ?Assistive device: Rolling walker (2 wheels), Quad cane ?Gait Patter

## 2021-04-24 NOTE — H&P (Addendum)
? ? ?Physical Medicine and Rehabilitation Admission H&P ? ?  ?Chief Complaint  ?Patient presents with  ? Functional deficits   ? ? 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 He was found to have large hematoma over occiput and initially confused with reports of new cough X 3 days.  CT head negative for bleed and CXR showed RML opacity concerning for PNA.  He declined overnight stay, was given dose of IV antibiotics and discharged to home on Doxycycline and Augmentin.  He was readmitted on 03/26 with reports of fatigue with intermittent hallucinations, slurred speech, bedwetting at nights as well as melena. He was found to have interval development of epidural collection overlying left hemisphere felt to be subacute as well as heme positive stool with mild drop in H/H.  ? ?Dr. Ronnald Ramp was consulted for input and a repeat CT stable no intervention needed and recommended stopping Xarelto. He was placed on full liquid diet and made NPO. Case was discussed with Dr. Collene Mares who recommended d/c of NSAIDs (which patient was taking for gout flare) and to monitor for [place] bleeding or drop in H/H. No work up  needed as H/H stable and regular diet resumed. Patient has had multiple concerns re: d/c of DOAC and was recommended to follow up w/cardiology for input after discharge. He has had issues with intermittent bradycardia during sleep with HR in 39-40 range. He has had progressive drop in sodium to 125 with hypokalemia and rise in WBC to 13.0 but H/H has been stable. Bedside swallow without signs of dysphagia. He continues to have issues with confusion/hallucinations at nights requiring Telesitter for safety.  He continues to be limited by balance  deficits with decreased safety awareness, delay in processing with increased time to follow simple one step commands. CIR recommended due to functional decline. Walking with wife to use commode now. ? ? ?Review of Systems  ?Constitutional:  Negative for chills and fever.  ?HENT:  Positive for hearing loss and tinnitus.   ?Eyes:  Negative for blurred vision and double vision.  ?Respiratory:  Positive for cough (getting better).   ?Cardiovascular:  Negative for chest pain.  ?Gastrointestinal:  Negative for constipation, heartburn and nausea.  ?     Reports black stool yesterday.   ?Genitourinary:  Negative for dysuria.  ?Musculoskeletal:  Positive for falls (falls once a week--loses balance).  ?Neurological:  Positive for dizziness, weakness and headaches.  ?Psychiatric/Behavioral:  Positive for hallucinations (two episodes yesterday--patient aware.).   ?     Having severe nightmares  ? ? ?Past Medical History:  ?Diagnosis Date  ? Atrial fibrillation ([facility])   ? CAD (coronary artery disease)   ? CVA (cerebral vascular accident) ([facility]) 05/27/2013  ? GI bleed 05/27/2013  ? HTN (hypertension)   ? Hx of CABG   ? Possibly 2015?  ? Hyperlipidemia   ? ? ?Past Surgical History:  ?Procedure Laterality Date  ? CORONARY ARTERY BYPASS GRAFT  1992  ? ESOPHAGOGASTRODUODENOSCOPY    ? TONSILLECTOMY AND ADENOIDECTOMY    ? ? ?Family History  ?Problem Relation Age of Onset  ? Vision loss Mother   ? Breast cancer Mother 45  ? Hypertension Father   ? Heart  attack Sister 41  ? ? ?Social History:   Married. 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 Has been slowing down since PNA prior to thanksgiving.  He reports that he quit smoking about 54 years ago. His smoking use included cigarettes. He started smoking about 66 years ago. He has a 24.00 pack-year smoking history. He has never used smokeless tobacco. He reports that he does not currently use alcohol. He reports that he does not use drugs. ? ?Allergies: No Known  Allergies ? ? ?Medications Prior to Admission  ?Medication Sig Dispense Refill  ? benzonatate (TESSALON) 100 MG capsule Take 1 capsule (100 mg total) by mouth 2 (two) times daily. 20 capsule 0  ? BREO ELLIPTA 100-25 MCG/ACT AEPB TAKE 1 PUFF BY MOUTH EVERY DAY (Patient taking differently: Inhale 1 puff into the lungs daily.) 60 each 2  ? calcium carbonate (CALCIUM 600) 600 MG TABS tablet Take 600 mg by mouth 2 (two) times daily with a meal.    ? Cholecalciferol (VITAMIN D) 125 MCG (5000 UT) CAPS Take 5,000 Units by mouth daily.    ? doxycycline (VIBRAMYCIN) 100 MG capsule Take 1 capsule (100 mg total) by mouth 2 (two) times daily. 20 capsule 0  ? ENTRESTO 49-51 MG TAKE 1 TABLET BY MOUTH TWICE A DAY (Patient taking differently: Take 1 tablet by mouth 2 (two) times daily.) 60 tablet 0  ? ezetimibe (ZETIA) 10 MG tablet Take 10 mg by mouth daily.    ? famotidine (PEPCID) 20 MG tablet Take 20 mg by mouth 2 (two) times daily.    ? guaiFENesin (MUCINEX) 600 MG 12 hr tablet Take 2 tablets (1,200 mg total) by mouth 2 (two) times daily.    ? hydrALAZINE (APRESOLINE) 25 MG tablet TAKE 1 TABLET BY MOUTH THREE TIMES A DAY (Patient taking differently: Take 25 mg by mouth 3 (three) times daily.) 270 tablet 0  ? ibandronate (BONIVA) 150 MG tablet Take 150 mg by mouth every 30 (thirty) days. Saturday    ? isosorbide dinitrate (ISORDIL) 20 MG tablet TAKE 1 TABLET BY MOUTH THREE TIMES A DAY (Patient taking differently: Take 20 mg by mouth 3 (three) times daily.) 270 tablet 0  ? melatonin 3 MG TABS tablet Take 1 tablet (3 mg total) by mouth at bedtime.  0  ? Misc Natural Products (MENS PROSTATE HEALTH FORMULA PO) Take 2 tablets by mouth daily.    ? Multiple Vitamin (MULTIVITAMIN) capsule Take 1 capsule by mouth daily.    ? omeprazole (PRILOSEC OTC) 20 MG tablet Take 20 mg by mouth daily.    ? Potassium 99 MG TABS Take 198 mg by mouth daily.    ? rosuvastatin (CRESTOR) 20 MG tablet Take 1 tablet (20 mg total) by mouth at bedtime for 90  doses.    ? [START ON 04/25/2021] tamsulosin (FLOMAX) 0.4 MG CAPS capsule Take 2 capsules (0.8 mg total) by mouth daily. 30 capsule   ? torsemide (DEMADEX) 5 MG tablet TAKE 1 TABLET BY MOUTH EVERY DAY IN THE MORNING (Patient taking differently: Take 5 mg by mouth daily.) 30 tablet 0  ? ?Home: ?Home Living ?Family/patient expects to be discharged to:: Private residence ?Living Arrangements: Spouse/significant other ?Available Help at Discharge: Family, Available 24 hours/day ?Type of Home: House ?Home Access: Stairs to enter ?Entrance Stairs-Number of Steps: 3 ?Entrance Stairs-Rails: Left ?Home Layout: One level ?Bathroom Toilet: Standard ?Home Equipment: Kasandra Knudsen - single point, Conservation officer, nature (2 wheels) Management consultant) ?Additional Comments: used SPC in R hand and  L hand on rail to enter house ?  ?Functional History: ?Prior Function ?Prior Level of Function : History of Falls (last six months) ?Mobility Comments: requires hands on him as well as AD ?  ?Functional Status:  ?Mobility: ?Bed Mobility ?Overal bed mobility: Needs Assistance ?Bed Mobility: Supine to Sit ?Supine to sit: Supervision ?Sit to supine: Min assist ?General bed mobility comments: scooting up in bed 2 mod assist ?Transfers ?Overall transfer level: Needs assistance ?Equipment used: Rolling walker (2 wheels) ?Transfers: Sit to/from Stand, Bed to chair/wheelchair/BSC ?Sit to Stand: Min guard ?General transfer comment: min guard for safety, cues for hand placement especially for controlled descent ?Ambulation/Gait ?Ambulation/Gait assistance: Min assist ?Gait Distance (Feet): 120 Feet ?Assistive device: Rolling walker (2 wheels), Quad cane ?Gait Pattern/deviations: Step-through pattern, Decreased stride length, Trunk flexed, Drifts right/left ?General Gait Details: pt with noted unsteadiness with quad cane drifting R/L and scissoring feet, pt requesting use as is his baseline, swtiched to RW for 90' with noted increase in stability, no drifting, pt in agreement  that RW most appropriate AD at this time. ?Gait velocity: reduced ?Gait velocity interpretation: <1.31 ft/sec, indicative of household ambulator ?Pre-gait activities: standing balance cues with lateral corrections ?

## 2021-04-24 NOTE — Progress Notes (Signed)
Patient was seen and examined prior to discharge to rehab today.  He was awake and alert and pleasant and interactive and conversant.  He complained of some occipital headache.  He was moving all extremities well.  We will continue to follow.  He will need a follow-up head CT at the end of this week ? ?He may be a candidate for MMA embolization to treat the chronic subdural hematoma on the left.  This would allow him to resume his anticoagulant much more quickly I believe.  I have discussed the case with Dr. Conchita Paris who will review the chart and the films. ?

## 2021-04-24 NOTE — Progress Notes (Signed)
Physical Therapy Treatment ?Patient Details ?Name: Calvin Brown ?MRN: [id number] ?DOB: [birth year] ?Today's Date: 04/24/2021 ? ? ?History of Present Illness 88 yo male with onset of fall last week was found to have no intracranial findings, but did have PNA.  New admission on 3.26 for hallucinations and light sensitivity, now has low to intermediate density subdural collection along the left convexity.  GI bleed dx now as well.  PMHx:  a-fib, blood thinners, chronic ambulatory dysfunction, HTN, pulm fibrosis, CHF, GI bleed from AVM with cauterization, ? ?  ?PT Comments  ? ? Pt received OOB in recliner and agreeable to session with grossly min guard needed for transfers and ambulation. Ambulation focused on gait mechanics with heel strike and B foot clearance. Pt with tendency to over correct with steppage gait but able correct with cues. Pt with good tolerance for standing LE therex for increased strength and ROM. Current plan remains appropriate to address deficits and maximize functional independence and decrease caregiver burden. Pt continues to benefit from skilled PT services to progress toward functional mobility goals.  ?  ?Recommendations for follow up therapy are one component of a multi-disciplinary discharge planning process, led by the attending physician.  Recommendations may be updated based on patient status, additional functional criteria and insurance authorization. ? ?Follow Up Recommendations ? Acute inpatient rehab (3hours/day) ?  ?  ?Assistance Recommended at Discharge Frequent or constant Supervision/Assistance  ?Patient can return home with the following A little help with walking and/or transfers;A little help with bathing/dressing/bathroom;Assistance with cooking/housework;Assist for transportation;Help with stairs or ramp for entrance ?  ?Equipment Recommendations ? None recommended by PT  ?  ?Recommendations for Other Services Rehab consult ? ? ?  ?Precautions / Restrictions  Precautions ?Precautions: Fall ?Precaution Comments: monitor HR and sats ?Restrictions ?Weight Bearing Restrictions: No  ?  ? ?Mobility ? Bed Mobility ?Overal bed mobility:  ?Bed Mobility:  ?  ?  ?Supine to sit: OOB on arrival ?  ?  ?  ?  ? ?Transfers ?Overall transfer level: Needs assistance ?Equipment used: Rolling walker (2 wheels) ?Transfers: Sit to/from Stand, Bed to chair/wheelchair/BSC ?Sit to Stand: Min guard ?  ?  ?  ?  ?  ?General transfer comment: min guard for safety, cues for hand placement especially for controlled descent ?  ? ?Ambulation/Gait ?Ambulation/Gait assistance: Min assist ?Gait Distance (Feet): 100 Feet ?Assistive device: Rolling walker (2 wheels) ?Gait Pattern/deviations: Step-through pattern, Decreased stride length, Trunk flexed, Drifts right/left ?Gait velocity: reduced ?  ?  ?General Gait Details: gait concentrating on heel strike, with intermittent ability, cues to not high step but to light feet for clearance, one instance of knee buckling but pt able to self correct ? ? ?Stairs ?  ?  ?  ?  ?  ? ? ?Wheelchair Mobility ?  ? ?Modified Rankin (Stroke Patients Only) ?  ? ? ?  ?Balance Overall balance assessment: Needs assistance, History of Falls ?Sitting-balance support: Bilateral upper extremity supported, Feet supported ?Sitting balance-Leahy Scale: Good ?  ?  ?Standing balance support: Bilateral upper extremity supported, During functional activity ?Standing balance-Leahy Scale: Poor ?Standing balance comment: poor balance without UE support in BR, fair with BUE support on RW. heavy cues during sit <> stand for awareness of balance ?  ?  ?  ?  ?  ?  ?  ?  ?  ?  ?  ?  ? ?  ?Cognition Arousal/Alertness: Awake/alert ?Behavior During Therapy: Impulsive ?Overall Cognitive Status: Impaired/Different from baseline ?Area  of Impairment: Problem solving, Awareness, Safety/judgement, Following commands, Memory, Attention, Orientation ?  ?  ?  ?  ?  ?  ?  ?  ?Orientation Level:  Situation ?Current Attention Level: Selective ?Memory: Decreased recall of precautions ?Following Commands: Follows one step commands with increased time ?Safety/Judgement: Decreased awareness of deficits, Decreased awareness of safety ?Awareness: Intellectual ?Problem Solving: Slow processing, Requires verbal cues, Requires tactile cues ?  ?  ?  ? ?  ?Exercises General Exercises - Lower Extremity ?Hip ABduction/ADduction: AROM, Right, Left, 10 reps, Standing ?Hip Flexion/Marching: AROM, Right, Left, 10 reps, Standing ?Toe Raises: AROM, Right, Left, 10 reps, Seated ?Heel Raises: AROM, Right, Left, 10 reps, Seated ?Mini-Sqauts: AROM, Both, 10 reps, Standing ? ?  ?General Comments General comments (skin integrity, edema, etc.): VSS on RA ?  ?  ? ?Pertinent Vitals/Pain Pain Assessment ?Pain Assessment: No/denies pain  ? ? ?Home Living   ?  ?  ?  ?  ?  ?  ?  ?  ?  ?   ?  ?Prior Function    ?  ?  ?   ? ?PT Goals (current goals can now be found in the care plan section) Acute Rehab PT Goals ?Patient Stated Goal: to get home and independent to walk again ?PT Goal Formulation: With patient/family ?Time For Goal Achievement: 05/06/21 ? ?  ?Frequency ? ? ? Min 4X/week ? ? ? ?  ?PT Plan Current plan remains appropriate  ? ? ?Co-evaluation   ?  ?  ?  ?  ? ?  ?AM-PAC PT "6 Clicks" Mobility   ?Outcome Measure ? Help needed turning from your back to your side while in a flat bed without using bedrails?: A Little ?Help needed moving from lying on your back to sitting on the side of a flat bed without using bedrails?: A Little ?Help needed moving to and from a bed to a chair (including a wheelchair)?: A Little ?Help needed standing up from a chair using your arms (e.g., wheelchair or bedside chair)?: A Little ?Help needed to walk in hospital room?: A Little ?Help needed climbing 3-5 steps with a railing? : Total ?6 Click Score: 16 ? ?  ?End of Session Equipment Utilized During Treatment: Gait belt ?Activity Tolerance: Patient  tolerated treatment well ?Patient left: in chair;with call bell/phone within reach;with family/visitor present;with chair alarm set Wellsite geologist) ?Nurse Communication: Mobility status ?PT Visit Diagnosis: Unsteadiness on feet (R26.81) ?  ? ? ?Time: [id number] ?PT Time Calculation (min) (ACUTE ONLY): 17 min ? ?Charges:  $Therapeutic Exercise: 8-22 mins          ?          ? ?Audry Riles. PTA ?Acute Rehabilitation Services ?Office: [phone] ? ? ? ?Betsey Holiday Ernisha Sorn ?04/24/2021, 11:07 AM ? ?

## 2021-04-24 NOTE — Progress Notes (Signed)
?   04/24/21 0118  ?Assess: MEWS Score  ?Temp 98.4 ?F (36.9 ?C)  ?BP 131/83  ?Pulse Rate 81  ?Resp 19  ?SpO2 95 %  ?O2 Device Room Air  ?Assess: MEWS Score  ?MEWS Temp 0  ?MEWS Systolic 0  ?MEWS Pulse 0  ?MEWS RR 0  ?MEWS LOC 0  ?MEWS Score 0  ?MEWS Score Color Green  ?Notify: Provider  ?Provider Name/Title J. Garner Nash NP  ?Date Provider Notified 04/24/21  ?Time Provider Notified 0122  ?Notification Type  ?(amion)  ?Notification Reason Other (Comment) ?(Tele event heart rate in 40's not sustained)  ?Provider response Other (Comment) ?(reviewing)  ?Date of Provider Response 04/24/21  ?Time of Provider Response 0126  ? ? ?

## 2021-04-24 NOTE — Care Management Important Message (Signed)
Important Message ? ?Patient Details  ?Name: Calvin Brown ?MRN: [id number] ?Date of Birth: 05-19-1933 ? ? ?Medicare Important Message Given:  Yes ? ? ? ? ?Calvin Brown  Calvin Brown ?04/24/2021, 12:22 PM ?

## 2021-04-25 LAB — COMPREHENSIVE METABOLIC PANEL
ALT: 33 U/L (ref 0–44)
AST: 21 U/L (ref 15–41)
Albumin: 2.9 g/dL — ABNORMAL LOW (ref 3.5–5.0)
Alkaline Phosphatase: 47 U/L (ref 38–126)
Anion gap: 7 (ref 5–15)
BUN: 17 mg/dL (ref 8–23)
CO2: 28 mmol/L (ref 22–32)
Calcium: 7.9 mg/dL — ABNORMAL LOW (ref 8.9–10.3)
Chloride: 93 mmol/L — ABNORMAL LOW (ref 98–111)
Creatinine, Ser: 0.68 mg/dL (ref 0.61–1.24)
GFR, Estimated: >60 mL/min (ref >60)
Glucose, Bld: 105 mg/dL — ABNORMAL HIGH (ref 70–99)
Potassium: 3.8 mmol/L (ref 3.5–5.1)
Sodium: 128 mmol/L — ABNORMAL LOW (ref 135–145)
Total Bilirubin: 0.8 mg/dL (ref 0.3–1.2)
Total Protein: 5.8 g/dL — ABNORMAL LOW (ref 6.5–8.1)

## 2021-04-25 LAB — CBC WITH DIFFERENTIAL/PLATELET
Abs Immature Granulocytes: 0.11 10*3/uL — ABNORMAL HIGH (ref 0.00–0.07)
Basophils Absolute: 0 10*3/uL (ref 0.0–0.1)
Basophils Relative: 0 %
Eosinophils Absolute: 0 10*3/uL (ref 0.0–0.5)
Eosinophils Relative: 0 %
HCT: 34.1 % — ABNORMAL LOW (ref 39.0–52.0)
Hemoglobin: 12 g/dL — ABNORMAL LOW (ref 13.0–17.0)
Immature Granulocytes: 1 %
Lymphocytes Relative: 7 %
Lymphs Abs: 0.8 10*3/uL (ref 0.7–4.0)
MCH: 30.9 pg (ref 26.0–34.0)
MCHC: 35.2 g/dL (ref 30.0–36.0)
MCV: 87.9 fL (ref 80.0–100.0)
Monocytes Absolute: 1 10*3/uL (ref 0.1–1.0)
Monocytes Relative: 9 %
Neutro Abs: 9.8 10*3/uL — ABNORMAL HIGH (ref 1.7–7.7)
Neutrophils Relative %: 83 %
Platelets: 242 10*3/uL (ref 150–400)
RBC: 3.88 MIL/uL — ABNORMAL LOW (ref 4.22–5.81)
RDW: 12.8 % (ref 11.5–15.5)
WBC: 11.8 10*3/uL — ABNORMAL HIGH (ref 4.0–10.5)
nRBC: 0 % (ref 0.0–0.2)

## 2021-04-25 LAB — VITAMIN B12: Vitamin B-12: 878 pg/mL (ref 180–914)

## 2021-04-25 LAB — VITAMIN D 25 HYDROXY (VIT D DEFICIENCY, FRACTURES): Vit D, 25-Hydroxy: 101.14 ng/mL — ABNORMAL HIGH (ref 30–100)

## 2021-04-25 LAB — IRON AND TIBC
Iron: 37 ug/dL — ABNORMAL LOW (ref 45–182)
Saturation Ratios: 15 % — ABNORMAL LOW (ref 17.9–39.5)
TIBC: 242 ug/dL — ABNORMAL LOW (ref 250–450)
UIBC: 205 ug/dL

## 2021-04-25 MED ORDER — RIVAROXABAN 15 MG PO TABS
15.0000 mg | ORAL_TABLET | Freq: Every day | ORAL | Status: DC
  Administered 2021-04-25 – 2021-05-01 (×7): 15 mg via ORAL
  Filled 2021-04-25 (×7): qty 1

## 2021-04-25 MED ORDER — PANTOPRAZOLE SODIUM 40 MG PO TBEC
40.0000 mg | DELAYED_RELEASE_TABLET | Freq: Two times a day (BID) | ORAL | Status: DC
Start: 2021-04-25 — End: 2021-05-03
  Administered 2021-04-25 – 2021-05-02 (×15): 40 mg via ORAL
  Filled 2021-04-25 (×15): qty 1

## 2021-04-25 NOTE — Progress Notes (Signed)
Inpatient Rehabilitation Care Coordinator ?Assessment and Plan ?Patient Details  ?Name: Calvin Brown ?MRN: [id number] ?Date of Birth: [birth year] ? ?Today's Date: 04/25/2021 ? ?Hospital Problems: Principal Problem: ?  TBI (traumatic brain injury) ? ?Past Medical History:  ?Past Medical History:  ?Diagnosis Date  ? Atrial fibrillation ([facility])   ? CAD (coronary artery disease)   ? CVA (cerebral vascular accident) ([facility]) 05/27/2013  ? GI bleed 05/27/2013  ? HTN (hypertension)   ? Hx of CABG   ? Possibly 2015?  ? Hyperlipidemia   ? ?Past Surgical History:  ?Past Surgical History:  ?Procedure Laterality Date  ? CORONARY ARTERY BYPASS GRAFT  1992  ? ESOPHAGOGASTRODUODENOSCOPY    ? TONSILLECTOMY AND ADENOIDECTOMY    ? ?Social History:  reports that he quit smoking about 54 years ago. His smoking use included cigarettes. He started smoking about 66 years ago. He has a 24.00 pack-year smoking history. He has never used smokeless tobacco. He reports that he does not currently use alcohol. He reports that he does not use drugs. ? ?Family / Support Systems ?Marital Status: Married ?Patient Roles: Spouse, Parent ?Spouse/Significant Other: Vicky [phone] ?Anticipated Caregiver: Lynden Ang ?Ability/Limitations of Caregiver: NA ?Caregiver Availability: 24/7 ?Family Dynamics: Close knit with family who have assisted him, he has been declining in the past six months and falling more at home. ? ?Social History ?Preferred language: English ?Religion:  ?Cultural Background: No issues ?Education: Medical Degress-retired Ortho Surgeon ?Health Literacy - How often do you need to have someone help you when you read instructions, pamphlets, or other written material from your doctor or pharmacy?: Never ?Writes: Yes ?Employment Status: Retired ?Legal History/Current Legal Issues: No issues ?Guardian/Conservator: None-according to the MD pt is not fully capable of making his own decisions while here, will need to include wife in any decision  needing to be made while here  ? ?Abuse/Neglect ?Abuse/Neglect Assessment Can Be Completed: Yes ?Physical Abuse: Denies ?Verbal Abuse: Denies ?Sexual Abuse: Denies ?Exploitation of patient/patient's resources: Denies ?Self-Neglect: Denies ? ?Patient response to: ?Social Isolation - How often do you feel lonely or isolated from those around you?: Never ? ?Emotional Status ?Pt's affect, behavior and adjustment status: Pt is motivated to do well and has enjoyed his am therapies. He has been independent up until 6 months ago with a slow decline and falling more at home after health issues. Hopes to get back to ambulating with a cane or walker at discharge ?Recent Psychosocial Issues: health issues-history of CVA, COVID and PNA and now GI bleed along with hematoma ?Psychiatric History: No history may benefit from neruro-psych seeing for cognitive eval and coping ?Substance Abuse History: No issues ? 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 Wife is here daily and talks with the MD regarding treatment plans and concerns she has. She feels they are being addressed ?Premorbid pt/family roles/activities: husband, father, grandfather, retiree, church member, etc ?Anticipated changes in roles/activities/participation: resume ?Pt/family expectations/goals: Pt states: " I am trying and feel like I am doing well." Wife states: " I hope he can get stronger and do well here." ? ?Community Resources ?Community Agencies: None ?Premorbid Home Care/DME Agencies: Other (Comment) (has quad cane cane and rw) ?Transportation available at discharge: Wife ?Is the patient able to respond to transportation needs?: Yes ?In the past 12 months, has lack  of transportation kept you from medical appointments or from getting medications?: No ?In the  past 12 months, has lack of transportation kept you from meetings, work, or from getting things needed for daily living?: No ?Resource referrals recommended: Neuropsychology ? ?Discharge Planning ?Living Arrangements: Spouse/significant other ?Support Systems: Spouse/significant other, Children, Friends/neighbors, Church/faith community ?Type of Residence: Private residence ?Insurance Resources: [employer], Multimedia programmer (specify) (commerical supplement) ?Financial Resources: Fish farm manager, Family Support ?Financial Screen Referred: No ?Living Expenses: Own ?Money Management: Spouse ?Does the patient have any problems obtaining your medications?: No ?Home Management: wife manages this ?Patient/Family Preliminary Plans: Return home with wife who is aware he may require 24/7 care he was needing assist prior to admission. Was also falling due to having halth issues. Aware being evaluated today and goals being set for stay here ?Care Coordinator Anticipated Follow Up Needs: HH/OP ? ?Clinical Impression ?Pleasant gentleman who is motivated to do well and get home doing better than he was right before admission. His wife is involved and supportive here daily. Will await team's evaluation and work on discharge needs. ? ?Elease Hashimoto ?04/25/2021, 1:49 PM ? ?  ?

## 2021-04-25 NOTE — Plan of Care (Signed)
?  Problem: RH Grooming ?Goal: LTG Patient will perform grooming w/assist,cues/equip (OT) ?Description: LTG: Patient will perform grooming with assist, with/without cues using equipment (OT) ?Flowsheets (Taken 04/25/2021 1305) ?LTG: Pt will perform grooming with assistance level of: Supervision/Verbal cueing ?  ?Problem: RH Bathing ?Goal: LTG Patient will bathe all body parts with assist levels (OT) ?Description: LTG: Patient will bathe all body parts with assist levels (OT) ?Flowsheets (Taken 04/25/2021 1305) ?LTG: Pt will perform bathing with assistance level/cueing: Supervision/Verbal cueing ?LTG: Position pt will perform bathing: Shower ?  ?Problem: RH Dressing ?Goal: LTG Patient will perform upper body dressing (OT) ?Description: LTG Patient will perform upper body dressing with assist, with/without cues (OT). ?Flowsheets (Taken 04/25/2021 1305) ?LTG: Pt will perform upper body dressing with assistance level of: Set up assist ?Goal: LTG Patient will perform lower body dressing w/assist (OT) ?Description: LTG: Patient will perform lower body dressing with assist, with/without cues in positioning using equipment (OT) ?Flowsheets (Taken 04/25/2021 1305) ?LTG: Pt will perform lower body dressing with assistance level of: Supervision/Verbal cueing ?  ?Problem: RH Toileting ?Goal: LTG Patient will perform toileting task (3/3 steps) with assistance level (OT) ?Description: LTG: Patient will perform toileting task (3/3 steps) with assistance level (OT)  ?Flowsheets (Taken 04/25/2021 1305) ?LTG: Pt will perform toileting task (3/3 steps) with assistance level: Supervision/Verbal cueing ?  ?Problem: RH Toilet Transfers ?Goal: LTG Patient will perform toilet transfers w/assist (OT) ?Description: LTG: Patient will perform toilet transfers with assist, with/without cues using equipment (OT) ?Flowsheets (Taken 04/25/2021 1305) ?LTG: Pt will perform toilet transfers with assistance level of: Supervision/Verbal cueing ?  ?

## 2021-04-25 NOTE — Evaluation (Addendum)
Speech Language Pathology Assessment and Plan ? ?Patient Details  ?Name: Calvin Brown ?MRN: [id number] ?Date of Birth: 1933-02-11 ? ?SLP Diagnosis: Cognitive Impairments  ?Rehab Potential: Excellent ?ELOS: 7-9 days  ? ?Today's Date: 04/25/2021 ?SLP Individual Time: 1300-1400 ?SLP Individual Time Calculation (min): 60 min ? ?Hospital Problem: Principal Problem: ?  TBI (traumatic brain injury) ? ?Past Medical History:  ?Past Medical History:  ?Diagnosis Date  ? Atrial fibrillation ([place])   ? CAD (coronary artery disease)   ? CVA (cerebral vascular accident) ([place]) 05/27/2013  ? GI bleed 05/27/2013  ? HTN (hypertension)   ? Hx of CABG   ? Possibly 2015?  ? Hyperlipidemia   ? ?Past Surgical History:  ?Past Surgical History:  ?Procedure Laterality Date  ? CORONARY ARTERY BYPASS GRAFT  1992  ? ESOPHAGOGASTRODUODENOSCOPY    ? TONSILLECTOMY AND ADENOIDECTOMY    ? ? 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 He was found to have large hematoma over occiput and initially confused with reports of new cough X 3 days.  CT head negative for bleed and CXR showed RML opacity concerning for PNA.  He declined overnight stay, was given dose of IV antibiotics and discharged to home on Doxycycline and Augmentin.  He was readmitted on 03/26 with reports of fatigue with intermittent hallucinations, slurred speech, bedwetting at nights as well as melena. He was found to have interval development of epidural collection overlying left hemisphere felt to be subacute as well as heme positive stool with mild drop in H/H. He was placed on full liquid diet and made NPO. Regular diet resumed. He has had progressive drop in sodium  to 125 with hypokalemia and rise in WBC to 13.0 but H/H has been stable. Bedside swallow without signs of dysphagia. He continues to have issues with confusion/hallucinations at nights requiring Telesitter for safety.  He continues to be limited by balance deficits with decreased safety awareness, delay in processing with increased time to follow simple one step commands. CIR recommended due to functional decline.  ? ?Patient demonstrates mild cognitive impairments characterized by decreased sustained attention, complex problem solving, recall of functional information, and emergent awareness which impacts his safety with functional and familiar tasks. Behavior most consistent with a [place]. Patient exhibited tangential speech throughout visit and required min-to-mod A verbal cues to redirect. Spouse present and endorsed tangential speech at baseline. Spouse also felt that patient is near cognitive baseline with the exception of ongoing hallucinations; telesitter still in use. Pt/spouse report decreased short-term recall at baseline. Pt was responsible for managing medications at [facility]; spouse managed finances. Per spouse, pt lived a sedentary lifestyle. SLP administered the Cognistat and presented within the average range for orientation, attention, constructional ability, calculations, and reasoning; mild impairment with short-term memory. SLP also administered the [facility] Mental Status Examination (SLUMS) and patient scored  23/30 points with a score of 27 or above considered within the normal range. Patient's overall auditory comprehension and verbal expression appeared [facility] for all tasks assessed and patient was 100% intelligible at the conversation level without indication for dysarthria. Patient would benefit from skilled SLP intervention to maximize cognitive functioning and overall functional independence prior to  discharge.  ?  ?Skilled Therapeutic Interventions          Pt participated  in [place] Status Examination (SLUMS), Cognistat, as well as further non-standardized assessments of cognitive-linguistic, speech, and language function. Please see above.    ?  ?SLP Assessment ? Patient will need skilled Speech Lanaguage Pathology Services during CIR admission  ?  ?Recommendations ? Patient destination: Home ?Follow up Recommendations: 24 hour supervision/assistance ?Equipment Recommended: None recommended by SLP  ?  ?SLP Frequency 3 to 5 out of 7 days   ?SLP Duration ? ?SLP Intensity ? ?SLP Treatment/Interventions 7-9 days ? ?Minumum of 1-2 x/day, 30 to 90 minutes ? ?Cognitive remediation/compensation;Internal/external aids;Patient/family education;Speech/Language facilitation;Functional tasks   ? ?Pain ?Pain Assessment ?Pain Scale: 0-10 ?Pain Score: 0-No pain ? ?Prior Functioning ?Cognitive/Linguistic Baseline: Baseline deficits ?Baseline deficit details: Decreased short-term memory ?Type of Home: House ? Lives With: Spouse ?Available Help at Discharge: Family;Available 24 hours/day ?Vocation: Retired ? ?SLP Evaluation ?Cognition ?Overall Cognitive Status: Impaired/Different from baseline ?Arousal/Alertness: Awake/alert ?Orientation Level: Oriented X4 ?Year: 2023 ?Month: March ?Day of Week: Correct ?Attention: Focused;Sustained;Selective ?Focused Attention: Appears intact ?Sustained Attention: Impaired ?Sustained Attention Impairment: Verbal basic;Functional basic ?Selective Attention: Impaired ?Selective Attention Impairment: Verbal basic;Functional basic ?Memory: Impaired ?Memory Impairment: Storage deficit;Retrieval deficit;Decreased recall of new information ?Awareness: Impaired ?Awareness Impairment: Emergent impairment ?Problem Solving: Impaired ?Problem Solving Impairment: Verbal basic;Functional basic ?Executive Function: Organizing;Self Monitoring;Self Correcting ?Organizing: Impaired ?Organizing Impairment: Functional basic ?Self Monitoring: Impaired ?Self Monitoring  Impairment: Functional basic ?Self Correcting: Impaired ?Self Correcting Impairment: Functional basic ?Safety/Judgment: Impaired ?Comments: no overt safety concerns with therapist present however telesitter provided and pt reports feeling confused and auditory hallucinations present intermittently ?[place] [employer] Scales of Cognitive Functioning: Purposeful/appropriate  ?Comprehension ?Auditory Comprehension ?Overall Auditory Comprehension: Appears within functional limits for tasks assessed ?Conversation: Complex ?Interfering Components: Attention ?Expression ?Expression ?Primary Mode of Expression: Verbal ?Verbal Expression ?Overall Verbal Expression: Appears within functional limits for tasks assessed ?Written Expression ?Dominant Hand: Right ?Oral Motor ?Oral Motor/Sensory Function ?Overall Oral Motor/Sensory Function: Within functional limits ?Motor Speech ?Overall Motor Speech: Appears within functional limits for tasks assessed ?Respiration: Within functional limits ?Phonation: Normal ?Resonance: Within functional limits ?Articulation: Within functional limitis ?Intelligibility: Intelligible ?Motor Planning: Witnin functional limits ? ?Care Tool ?Care Tool Cognition ?Ability to hear (with hearing aid or hearing appliances if normally used Ability to hear (with hearing aid or hearing appliances if normally used): 0. Adequate - no difficulty in normal conservation, social interaction, listening to TV ?  ?Expression of Ideas and Wants Expression of Ideas and Wants: 3. Some difficulty - exhibits some difficulty with expressing needs and ideas (e.g, some words or finishing thoughts) or speech is not clear ?  ?Understanding Verbal and Non-Verbal Content Understanding Verbal and Non-Verbal Content: 3. Usually understands - understands most conversations, but misses some part/intent of message. Requires cues at times to understand  ?Memory/Recall Ability Memory/Recall Ability : Current season;That he or she is in a  hospital/hospital unit  ? ?Intelligibility: Intelligible ? ?Short Term Goals: ?Week 1: SLP Short Term Goal 1 (Week 1): STG=LTG due to ELOS ? ?Refer to Care Plan for Long Term Goals ? ?Recommendation

## 2021-04-25 NOTE — Progress Notes (Signed)
I have discussed this case with Dr. Conchita Paris, our neurovascular specialist.  The patient has a very small chronic subdural hematoma on the left.  The risk of rebleeding with anticoagulation is small, but it appears the risk of stroke off of anticoagulation for this particular gentleman is fairly high. ? ?Therefore we think it is relatively safe to resume his anticoagulation from the subdural hematoma standpoint.  Then follow-up with [place] in the office upon discharge with a repeat head CT, or if he is in rehab for more than a week repeat the head CT to make sure there is no growth of the subdural hematoma.  If it shows growth and he is relatively asymptomatic then we can consider MMA embolization to treat the lesion. ?

## 2021-04-25 NOTE — Progress Notes (Signed)
Inpatient Rehabilitation Center ?Individual Statement of Services ? ?Patient Name:  Calvin Brown  ?Date:  04/25/2021 ? ?Welcome to the Inpatient Rehabilitation Center.  Our goal is to provide you with an individualized program based on your diagnosis and situation, designed to meet your specific needs.  With this comprehensive rehabilitation program, you will be expected to participate in at least 3 hours of rehabilitation therapies Monday-Friday, with modified therapy programming on the weekends. ? ?Your rehabilitation program will include the following services:  Physical Therapy (PT), Occupational Therapy (OT), Speech Therapy (ST), 24 hour per day rehabilitation nursing, Neuropsychology, Care Coordinator, Rehabilitation Medicine, Nutrition Services, and Pharmacy Services ? ?Weekly team conferences will be held on Wednesday to discuss your progress.  Your Inpatient Rehabilitation Care Coordinator will talk with you frequently to get your input and to update you on team discussions.  Team conferences with you and your family in attendance may also be held. ? ?Expected length of stay: 7-9 days  Overall anticipated outcome: supervision with cues ? ?Depending on your progress and recovery, your program may change. Your Inpatient Rehabilitation Care Coordinator will coordinate services and will keep you informed of any changes. Your Inpatient Rehabilitation Care Coordinator's name and contact numbers are listed  below. ? ?The following services may also be recommended but are not provided by the Inpatient Rehabilitation Center:  ? ?Home Health Rehabiltiation Services ?Outpatient Rehabilitation Services ? ?  ?Arrangements will be made to provide these services after discharge if needed.  Arrangements include referral to agencies that provide these services. ? ?Your insurance has been verified to be:  Medicare & Commerical ?Your primary doctor is:  Charlane Ferretti ? ?Pertinent information will be shared with your doctor and  your insurance company. ? ?Inpatient Rehabilitation Care Coordinator:  Dossie Der, LCSW [phone] or (C) [phone] ? ?Information discussed with and copy given to patient by: Lucy Chris, 04/25/2021, 1:51 PM    ?

## 2021-04-25 NOTE — Evaluation (Addendum)
Occupational Therapy Assessment and Plan ? ?Patient Details  ?Name: Darrol Babin ?MRN: [id number] ?Date of Birth: 09-15-1933 ? ?OT Diagnosis: apraxia, cognitive deficits, and muscle weakness (generalized) ?Rehab Potential: Rehab Potential (ACUTE ONLY): Good ?ELOS: 7-9 days  ? ?Today's Date: 04/25/2021 ?OT Individual Time: [phone] ?OT Individual Time Calculation (min): 80 min    ? ?Hospital Problem: Principal Problem: ?  TBI (traumatic brain injury) ? ? ?Past Medical History:  ?Past Medical History:  ?Diagnosis Date  ? Atrial fibrillation ([facility])   ? CAD (coronary artery disease)   ? CVA (cerebral vascular accident) ([facility]) 05/27/2013  ? GI bleed 05/27/2013  ? HTN (hypertension)   ? Hx of CABG   ? Possibly 2015?  ? Hyperlipidemia   ? ?Past Surgical History:  ?Past Surgical History:  ?Procedure Laterality Date  ? CORONARY ARTERY BYPASS GRAFT  1992  ? ESOPHAGOGASTRODUODENOSCOPY    ? TONSILLECTOMY AND ADENOIDECTOMY    ? ? 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 He was found to have large hematoma over occiput and initially confused with reports of new cough X 3 days.  CT head negative for bleed and CXR showed RML opacity concerning for PNA.  He declined overnight stay, was given dose of IV antibiotics and discharged to home on Doxycycline and Augmentin.  He was readmitted on 03/26 with reports of fatigue with intermittent hallucinations, slurred speech, bedwetting at nights as well as melena. He was found to have interval development of epidural collection overlying left hemisphere felt to be subacute as well as heme positive stool with mild drop in H/H.  ?  ?Dr. Yetta Barre was consulted for input and a repeat CT  stable no intervention needed and recommended stopping Xarelto. He was placed on full liquid diet and made NPO. Case was discussed with Dr. Loreta Ave who recommended d/c of NSAIDs (which patient was taking for gout flare) and to monitor for [place] bleeding or drop in H/H. No work up  needed as H/H stable and regular diet resumed. Patient has had multiple concerns re: d/c of DOAC and was recommended to follow up w/cardiology for input after discharge. He has had issues with intermittent bradycardia during sleep with HR in 39-40 range. He has had progressive drop in sodium to 125 with hypokalemia and rise in WBC to 13.0 but H/H has been stable. Bedside swallow without signs of dysphagia. He continues to have issues with confusion/hallucinations at nights requiring Telesitter for safety.  He continues to be limited by balance deficits with decreased safety awareness, delay in processing with increased time to follow simple one step commands .  Patient transferred to CIR on 04/24/2021 .   ? ?Patient currently requires min with basic self-care skills secondary to muscle weakness, decreased cardiorespiratoy endurance, motor apraxia, decreased coordination, and decreased motor planning, decreased attention, decreased awareness, decreased problem solving, and decreased safety awareness, and decreased sitting balance, decreased standing balance, decreased postural control, and decreased balance strategies.  Prior to hospitalization, patient could complete ADLs with modified independent . ? ?Patient will benefit from skilled intervention to increase independence with basic self-care skills prior to discharge home with care partner.  Anticipate patient will require 24 hour supervision and  home health versus outpatient . ? ?OT - End of Session ?Activity Tolerance: Tolerates 30+ min activity with multiple rests ?Endurance Deficit: Yes ?Endurance Deficit Description: seated rest breaks b/w functional mobility tasks ?OT  Assessment ?Rehab Potential (ACUTE ONLY): Good ?OT Barriers to Discharge: Behavior;Medication compliance;Incontinence ?OT Patient demonstrates impairments in the following area(s): Balance;Motor;Behavior;Cognition;Endurance;Safety ?OT Basic ADL's Functional Problem(s): Grooming;Bathing;Dressing;Toileting ?OT Transfers Functional Problem(s): Toilet;Tub/Shower ?OT Additional Impairment(s): Fuctional Use of Upper Extremity ?OT Plan ?OT Intensity: Minimum of 1-2 x/day, 45 to 90 minutes ?OT Frequency: 5 out of 7 days ?OT Duration/Estimated Length of Stay: 7-9 days ?OT Treatment/Interventions: Balance/vestibular training;Community reintegration;Disease mangement/prevention;Functional electrical stimulation;Neuromuscular re-education;Patient/family education;Self Care/advanced ADL retraining;Therapeutic Exercise;UE/LE Coordination activities;Splinting/orthotics;Cognitive remediation/compensation;Discharge planning;DME/adaptive equipment instruction;Functional mobility training;Psychosocial support;Therapeutic Activities;UE/LE Strength taining/ROM ?OT Basic Self-Care Anticipated Outcome(s): supervision ?OT Toileting Anticipated Outcome(s): supervision ?OT Bathroom Transfers Anticipated Outcome(s): supervision ?OT Recommendation ?Follow Up Recommendations: Home health OT;Outpatient OT ?Equipment Recommended: To be determined ?Equipment Details: has quad cane per pt ? ? ?OT Evaluation ?Precautions/Restrictions  ?Precautions ?Precautions: Fall ?Precaution Comments: Bilateral foot drop (chronic, does not wear AFOs). Story teller, tangential speech ?Restrictions ?Weight Bearing Restrictions: No ?General ?Chart Reviewed: Yes ?Pain ?Pain Assessment ?Pain Scale: 0-10 ?Pain Score: 0-No pain ?Home Living/Prior Functioning ?Home Living ?Available Help at Discharge: Family, Available 24 hours/day (son lives with pt works as a NP) ?Type of Home: House ?Home Access: Stairs to enter ?Entrance Stairs-Number of Steps: 3 ?Entrance  Stairs-Rails: Left ?Home Layout: One level, Able to live on main level with bedroom/bathroom, Two level ?Bathroom Toilet: Standard ? Lives With: Spouse ?Prior Function ?Level of Independence: Requires assistive device for independence, Independent with transfers, Independent with basic ADLs, Needs assistance with homemaking ? Able to Take Stairs?: Yes ?Driving: Yes ?Vocation: Retired ?Vocation Requirements: orthopedic surgeon. ?Vision ?Baseline Vision/History: 0 No visual deficits ?Ability to See in Adequate Light: 0 Adequate ?Patient Visual Report: No change from baseline ?Vision Assessment?: No apparent visual deficits ?Perception  ?Perception: Within Functional Limits ?Praxis ?Praxis: Intact ?Cognition ?Cognition ?Overall Cognitive Status: Impaired/Different from baseline ?Arousal/Alertness: Awake/alert ?Orientation Level: Person;Place;Situation ?Person: Oriented ?Place: Oriented ?Situation: Oriented ?Memory: Appears intact ?Attention: Focused;Sustained;Selective ?Focused Attention: Appears intact ?Sustained Attention: Impaired ?Sustained Attention Impairment: Verbal basic;Functional basic ?Selective Attention: Impaired ?Selective Attention Impairment: Verbal basic;Functional basic ?Awareness: Impaired ?Awareness Impairment: Emergent impairment ?Problem Solving: Impaired ?Problem Solving Impairment: Verbal basic;Functional basic ?Executive Function: Organizing;Self Monitoring;Self Correcting ?Organizing: Impaired ?Organizing Impairment: Functional basic ?Self Monitoring: Impaired ?Self Monitoring Impairment: Functional basic ?Self Correcting: Impaired ?Self Correcting Impairment: Functional basic ?Safety/Judgment: Impaired ?Comments: no overt safety concerns with therapist present however telesitter provided and pt reports feeling confused and auditory hallucinations present intermittently ?Brief Interview for Mental Status (BIMS) ?Repetition of Three Words (First Attempt): 3 ?Temporal Orientation: Year:  Correct ?Temporal Orientation: Month: Accurate within 5 days ?Temporal Orientation: Day: Correct ?Recall: "Sock": Yes, no cue required ?Recall: "Blue": Yes, no cue required ?Recall: "Bed": No, could not recall ?BIMS Summary Score: 13 ?Einar Pheasant

## 2021-04-25 NOTE — Evaluation (Signed)
Physical Therapy Assessment and Plan ? ?Patient Details  ?Name: Calvin Brown ?MRN: [id number] ?Date of Birth: 11-Oct-1933 ? ?PT Diagnosis: Abnormal posture, Abnormality of gait, Difficulty walking, Impaired cognition, and Muscle weakness ?Rehab Potential: Good ?ELOS: 7-9 days  ? ?Today's Date: 04/25/2021 ?PT Individual Time: 1000-1100 ?PT Individual Time Calculation (min): 60 min   ? ?Hospital Problem: Principal Problem: ?  TBI (traumatic brain injury) ? ? ?Past Medical History:  ?Past Medical History:  ?Diagnosis Date  ? Atrial fibrillation ([place])   ? CAD (coronary artery disease)   ? CVA (cerebral vascular accident) ([place]) 05/27/2013  ? GI bleed 05/27/2013  ? HTN (hypertension)   ? Hx of CABG   ? Possibly 2015?  ? Hyperlipidemia   ? ?Past Surgical History:  ?Past Surgical History:  ?Procedure Laterality Date  ? CORONARY ARTERY BYPASS GRAFT  1992  ? ESOPHAGOGASTRODUODENOSCOPY    ? TONSILLECTOMY AND ADENOIDECTOMY    ? ? 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 He was found to have large hematoma over occiput and initially confused with reports of new cough X 3 days.  CT head negative for bleed and CXR showed RML opacity concerning for PNA.  He declined overnight stay, was given dose of IV antibiotics and discharged to home on Doxycycline and Augmentin.  He was readmitted on 03/26 with reports of fatigue with intermittent hallucinations, slurred speech, bedwetting at nights as well as melena. He was found to have interval development of epidural collection overlying left hemisphere felt to be subacute as well as heme positive stool with mild drop in H/H.  ?  ?Dr. Ronnald Ramp was consulted for input and a repeat CT stable  no intervention needed and recommended stopping Xarelto. He was placed on full liquid diet and made NPO. Case was discussed with Dr. Collene Mares who recommended d/c of NSAIDs (which patient was taking for gout flare) and to monitor for [place] bleeding or drop in H/H. No work up  needed as H/H stable and regular diet resumed. Patient has had multiple concerns re: d/c of DOAC and was recommended to follow up w/cardiology for input after discharge. He has had issues with intermittent bradycardia during sleep with HR in 39-40 range. He has had progressive drop in sodium to 125 with hypokalemia and rise in WBC to 13.0 but H/H has been stable. Bedside swallow without signs of dysphagia. He continues to have issues with confusion/hallucinations at nights requiring Telesitter for safety.  He continues to be limited by balance deficits with decreased safety awareness, delay in processing with increased time to follow simple one step commands. CIR recommended due to functional decline.Patient transferred to CIR on 04/24/2021 .  ? ?Patient currently requires min with mobility secondary to muscle weakness, decreased cardiorespiratoy endurance, decreased problem solving and decreased safety awareness, and decreased standing balance and decreased balance strategies.  Prior to hospitalization, patient was modified independent  with mobility and lived with Spouse in a House home.  Home access is 3Stairs to enter. ? ?Patient will benefit from skilled PT intervention to maximize safe functional mobility, minimize fall risk, and decrease caregiver burden for planned discharge home with 24 hour supervision.  Anticipate patient will benefit from  follow up [place] at discharge. ? ?PT - End of Session ?Activity Tolerance: Tolerates 10 - 20 min activity with multiple rests ?Endurance Deficit: Yes ?Endurance Deficit Description: seated rest breaks b/w functional mobility tasks ?PT Assessment ?Rehab Potential (ACUTE/IP ONLY): Good ?PT Barriers to  Discharge: Home environment access/layout;Insurance for SNF coverage;Behavior ?PT Patient demonstrates impairments in the following area(s): Balance;Behavior;Endurance;Motor;Safety ?PT Transfers Functional Problem(s): Bed Mobility;Bed to Chair;Car ?PT Locomotion Functional Problem(s): Ambulation;Stairs ?PT Plan ?PT Intensity: Minimum of 1-2 x/day ,45 to 90 minutes ?PT Frequency: 5 out of 7 days ?PT Duration Estimated Length of Stay: 7-9 days ?PT Treatment/Interventions: Discharge planning;Functional mobility training;Ambulation/gait training;Psychosocial support;Therapeutic Activities;Visual/perceptual remediation/compensation;Wheelchair propulsion/positioning;Therapeutic Exercise;Skin care/wound management;Neuromuscular re-education;Balance/vestibular training;Disease management/prevention;Cognitive remediation/compensation;DME/adaptive equipment instruction;Pain management;Splinting/orthotics;UE/LE Strength taining/ROM;UE/LE Coordination activities;Stair training;Patient/family education;Functional electrical stimulation;Community reintegration ?PT Transfers Anticipated Outcome(s): supervision with LRAD ?PT Locomotion Anticipated Outcome(s): supervision with LRAD ?PT Recommendation ?Recommendations for Other Services: Neuropsych consult ?Follow Up Recommendations: Home health PT ?Patient destination: Home ?Equipment Recommended: To be determined ?Equipment Details: Pt owns quad cane ? ? ?PT Evaluation ?Precautions/Restrictions ?Precautions ?Precautions: Fall ?Precaution Comments: Bilateral foot drop (chronic, does not wear AFOs). Story teller, tangential speech ?Restrictions ?Weight Bearing Restrictions: No ?Pain ?Pain Assessment ?Pain Scale: 0-10 ?Pain Score: 0-No pain ?Pain Interference ?Pain Interference ?Pain Effect on Sleep: 1. Rarely or not at all ?Pain Interference with Therapy Activities: 1. Rarely or not at all ?Pain Interference with Day-to-Day Activities: 1. Rarely or not at all ?Home Living/Prior  Functioning ?Home Living ?Available Help at Discharge: Family;Available 24 hours/day (son is an NP who lives in the basement. Wife available 24/7) ?Type of Home: House ?Home Access: Stairs to enter ?Entrance Stairs-Number of Steps: 3 ?Entrance Stairs-Rails: Left ?Home Layout: One level;Able to live on main level with bedroom/bathroom;Two level ? Lives With: Spouse ?Prior Function ?Level of Independence: Requires assistive device for independence;Independent with transfers ? Able to Take Stairs?: Yes ?Driving: Yes ?Vocation: Retired ?Vocation Requirements: orthopedic surgeon. ?Vision/Perception  ?Vision - History ?Ability to See in Adequate Light: 0 Adequate ?Perception ?Perception: Within Functional Limits ?Praxis ?Praxis: Intact  ?Cognition ?Orientation Level: Oriented X4 ?Sensation ?Sensation ?Light Touch: Appears Intact ?Hot/Cold: Appears Intact ?Proprioception: Appears Intact ?Stereognosis: Appears Intact ?Coordination ?[place] Motor Movements are Fluid and Coordinated: No ?Motor  ?Motor ?Motor: Other (comment) ?Motor - Skilled Clinical Observations: Generalized weakness and deconditioning  ? ?Trunk/Postural Assessment  ?Cervical Assessment ?Cervical Assessment: Exceptions to [facility] (forward head) ?Thoracic Assessment ?Thoracic Assessment: Exceptions to [facility] (rounded shuolders) ?Lumbar Assessment ?Lumbar Assessment: Exceptions to [facility] (posterior pelvic tilt) ?Postural Control ?Postural Control: Deficits on evaluation ?Righting Reactions: delayed and insufficient  ?Balance ?Balance ?Balance Assessed: Yes ?Static Sitting Balance ?Static Sitting - Balance Support: Feet supported;No upper extremity supported ?Static Sitting - Level of Assistance: 5: Stand by assistance ?Dynamic Sitting Balance ?Dynamic Sitting - Balance Support: Feet supported;During functional activity;No upper extremity supported ?Dynamic Sitting - Level of Assistance: 5: Stand by assistance ?Static Standing Balance ?Static Standing - Level of  Assistance: 5: Stand by assistance;4: Min assist ?Dynamic Standing Balance ?Dynamic Standing - Balance Support: During functional activity;No upper extremity supported ?Dynamic Standing - Level of Assistance: 4: Min assist

## 2021-04-25 NOTE — Progress Notes (Signed)
Inpatient Rehabilitation  Patient information reviewed and entered into eRehab system by Jordan Caraveo Neno [place], OTR/L.   Information including medical coding, functional ability and quality indicators will be reviewed and updated through discharge.    

## 2021-04-25 NOTE — Plan of Care (Signed)
?  Problem: RH Problem Solving ?Goal: LTG Patient will demonstrate problem solving for (SLP) ?Description: LTG:  Patient will demonstrate problem solving for basic/complex daily situations with cues  (SLP) ?Flowsheets (Taken 04/25/2021 1630) ?LTG: Patient will demonstrate problem solving for (SLP): Complex daily situations ?LTG Patient will demonstrate problem solving for: Supervision ?  ?Problem: RH Attention ?Goal: LTG Patient will demonstrate this level of attention during functional activites (SLP) ?Description: LTG:  Patient will will demonstrate this level of attention during functional activites (SLP) ?Flowsheets (Taken 04/25/2021 1630) ?Patient will demonstrate during cognitive/linguistic activities the attention type of: Sustained ?Patient will demonstrate this level of attention during cognitive/linguistic activities in: Controlled ?LTG: Patient will demonstrate this level of attention during cognitive/linguistic activities with assistance of (SLP): Supervision ?  ?Problem: RH Awareness ?Goal: LTG: Patient will demonstrate awareness during functional activites type of (SLP) ?Description: LTG: Patient will demonstrate awareness during functional activites type of (SLP) ?Flowsheets (Taken 04/25/2021 1630) ?Patient will demonstrate during cognitive/linguistic activities awareness type of: Emergent ?LTG: Patient will demonstrate awareness during cognitive/linguistic activities with assistance of (SLP): Supervision ?  ?

## 2021-04-25 NOTE — Progress Notes (Signed)
?                                                       PROGRESS NOTE ? ? ?Subjective/Complaints: ?Alert ?Still having hallucinations but recognizes they are not real ?Constipation improving ?Asks about restarting Xarelto- cleared by Dr. Yetta Barre today ? ?ROS: +hallucinations, constipation improving ? ? ?Objective: ?  ?No results found. ?Recent Labs  ?  04/24/21 ?0059 04/25/21 ?9767  ?WBC 13.0* 11.8*  ?HGB 11.6* 12.0*  ?HCT 32.9* 34.1*  ?PLT 261 242  ? ?Recent Labs  ?  04/24/21 ?0059 04/25/21 ?3419  ?NA 125* 128*  ?K 3.2* 3.8  ?CL 93* 93*  ?CO2 26 28  ?GLUCOSE 147* 105*  ?BUN 17 17  ?CREATININE 0.58* 0.68  ?CALCIUM 8.0* 7.9*  ? ? ?Intake/Output Summary (Last 24 hours) at 04/25/2021 1432 ?Last data filed at 04/24/2021 2140 ?Lapage per 24 hour  ?Intake 100 ml  ?Output 350 ml  ?Net -250 ml  ?  ? ?  ? ?Physical Exam: ?Vital Signs ?Blood pressure (!) 142/78, pulse 92, temperature (!) 97.5 ?F (36.4 ?C), resp. rate 20, height 5\' 11"  (1.803 m), weight 85.1 kg, SpO2 94 %. ?Gen: no distress, normal appearing, BMI 26.17 ?HENT:  ?   Head:  ?   Comments: Hematoma right scalp with layering ecchymosis down right neck.  ?Cardiovascular:  ?   Rate and Rhythm: Rhythm irregular.  ?Skin: ?   General: Skin is warm.  ?Neurological:  ?   Mental Status: He is alert and oriented to person, place, and time.  ?   Comments: Left facial weakness with dysarthric speech. Alert. Memory intact. Tangential, impaired attention. He was able to follow simple motor commands.  Can ambulate with wife's assistance in room. 4/5 strength throughout ? ?Assessment/Plan: ?1. Functional deficits which require 3+ hours per day of interdisciplinary therapy in a comprehensive inpatient rehab setting. ?Physiatrist is providing close team supervision and 24 hour management of active medical problems listed below. ?Physiatrist and rehab team continue to assess barriers to discharge/monitor patient progress toward functional and medical goals ? ?Care Tool: ? ?Bathing ?   ?    ?   ?  ?  ?Bathing assist Assist Level: Minimal Assistance - Patient > 75% ?  ?  ?Upper Body Dressing/Undressing ?Upper body dressing   ?  ?   ?Upper body assist Assist Level: Supervision/Verbal cueing ?   ?Lower Body Dressing/Undressing ?Lower body dressing ? ? ?   ?  ? ?  ? ?Lower body assist Assist for lower body dressing: Minimal Assistance - Patient > 75% ?   ? ?Toileting ?Toileting    ?Toileting assist Assist for toileting: Minimal Assistance - Patient > 75% ?Assistive Device Comment:  ) ?  ?Transfers ?Chair/bed transfer ? ?Transfers assist ?   ? ?Chair/bed transfer assist level: Contact Guard/Touching assist ?  ?  ?Locomotion ?Ambulation ? ? ?Ambulation assist ? ?   ? ?Assist level: Minimal Assistance - Patient > 75% ?Assistive device: Walker-rolling ?Max distance: 117ft  ? ?Walk 10 feet activity ? ? ?Assist ?   ? ?Assist level: Minimal Assistance - Patient > 75% ?Assistive device: Walker-rolling  ? ?Walk 50 feet activity ? ? ?Assist   ? ?Assist level: Minimal Assistance - Patient > 75% ?Assistive device: Walker-rolling  ? ? ?Walk 150 feet activity ? ? ?  Assist   ? ?Assist level: Minimal Assistance - Patient > 75% ?Assistive device: Walker-rolling ?  ? ?Walk 10 feet on uneven surface  ?activity ? ? ?Assist Walk 10 feet on uneven surfaces activity did not occur: Safety/medical concerns ? ? ?  ?   ? ?Wheelchair ? ? ? ? ?Assist Is the patient using a wheelchair?: No ?  ?  ? ?  ?   ? ? ?Wheelchair 50 feet with 2 turns activity ? ? ? ?Assist ? ?  ?  ? ? ?   ? ?Wheelchair 150 feet activity  ? ? ? ?Assist ?   ? ? ?   ? ?Blood pressure (!) 142/78, pulse 92, temperature (!) 97.5 ?F (36.4 ?C), resp. rate 20, height 5\' 11"  (1.803 m), weight 85.1 kg, SpO2 94 %. ? ? ? Medical Problem List and Plan: ?1. Functional deficits secondary to traumatic TBI ?            -patient may shower ?            -ELOS/Goals: 6-8 days S ?           Continue CIR ?2.  Antithrombotics: ?-DVT/anticoagulation:  Mechanical: Sequential  compression devices, below knee Bilateral lower extremities ?            -antiplatelet therapy: N/a ?3. Pain: continue tylenol prn.  ?4. Mood: LCSW to follow for evaluation and support.  ?            -antipsychotic agents: N/A ?5. Neuropsych: This patient may be intermittently capable of making decisions on his own behalf. ?6. Skin/Wound Care: Routine pressure relief measures.  ?7. Fluids/Electrolytes/Nutrition: Strict I/O.  ?8. Progressive hyponatremia: improving. Decrease fluid restriction to .  Continue salt tabs tid.  ? 9. CAF: Monitor HR TID--restart Xarelto as per Dr. ?            --follow up with Dr. Yetta Barre after discharge.  ?10. Melena/H/o GIB/AVM: Had black stools yesterday--usually has BM every 5 days. Resume colace.  ?--H/H today stable. Recheck CBC in am.  ?--monitor for hematochezia/drop in H/H ?11. Chronic diastolic CHF: Monitor weights daily and for signs of overload. ?            --continue Crestor, Hydralazine, Entresto and Demadex. ?12. H/o IPF/CAP: Continue MDI. Has completed antibiotic course.  ?-- Cough resolving with  tessalon perles/Mucinex ?13. Hallucinations: Has been ongoing and he reports  2 episodes yesterday. Improving ?--Telesitter for safety.  ?14. Nightmares/Chronic insomnia: Continue Melatonin 3 mg (has nightmares with higher doses) ?15. Hypokalemia: Continue klor 20 BID.  ?16. Screening for vitamin D deficiency: vitamin D level is 101- no need for supplementation.  ?17. Suboptimal magnesium: discuss starting supplement at night with patient and wife ?18. Acute blood loss anemia: check iron and B12 tomorrow morning. ?19. Overweight BMI 26.17: provide dietary education ? ?LOS: ?1 days ?A FACE TO FACE EVALUATION WAS PERFORMED ? ?Calvin Brown ?04/25/2021, 2:32 PM  ? ?  ?

## 2021-04-26 DIAGNOSIS — S065X0S Traumatic subdural hemorrhage without loss of consciousness, sequela: Secondary | ICD-10-CM

## 2021-04-26 LAB — CULTURE, BLOOD (ROUTINE X 2)
Culture: NO GROWTH
Culture: NO GROWTH
Special Requests: ADEQUATE

## 2021-04-26 NOTE — Progress Notes (Signed)
RT came to patient room to give AM Breo dose however patient was working with therapy and unable to give at this time.  ?

## 2021-04-26 NOTE — Progress Notes (Signed)
Physical Therapy TBI Note ? ?Patient Details  ?Name: Calvin Brown ?MRN: [id number] ?Date of Birth: 1933/06/27 ? ?Today's Date: 04/26/2021 ?PT Individual Time: 1300-1400 ?PT Individual Time Calculation (min): 60 min  ? ?Short Term Goals: ?Week 1:  PT Short Term Goal 1 (Week 1): STG = LTG due to ELOS ? ?Skilled Therapeutic Interventions/Progress Updates:  ?   ?Pt presenting sitting in recliner - son at the bedside who was also present throughout session. Pt agreeable to PT tx and denies pain. Sit<>stand to RW with supervision - ambulated within his room to w/c with CGA and RW - decreased bilateral heel strike 2/2 bilateral foot drop - cueing for hand placement during descent for sitting to w/c. Wife entering room for remainder of session. Transported to day room rehab gym to focus remainder of session on gait training using LiteGait over Treadmill. [place]ness donned/doffed in standing with CGA and BUE support. Light minA needed for stepping on/off the treadmill using the LiteGait for support. Completed the following gait trials: ? ?1)  Forward walking - 3 minutes, 0.75mph, BUE support, 172ft ? ?2) Forward walking - 3 minutes, 0.6-0.12mph, BUE support, 14ft ? ?3) Side stepping L - 1.5 minutes, 0.79mph, BUE support, 67ft ? ?4) Side stepping R - 1.5 minutes, 0.23mph, BUE support, 65ft ? ?With gait, demonstrates somewhat of a crouched gait pattern with flexed hips and knees, limited heel strike bilaterally and pt compensates for foot drop with excessive hip flexion with foot slap during gait. Educated pt on AFO's and purposes of them - once PT showed him what they looked like, he was more agreeable to wearing them (he was thinking big clunky braces). Donned a L AFO, walk-on trimmable, (didn't don the R due to time) and this was used during the 2nd gait trial. Much improve heel strike and foot rocker during initial contact to midstance. He also had improved side stepping motions to the L > R due to AFO on L.  ? ?Pt returned to room  at end of session. Wife asking if pt can ambulate with a RW. Attempted this in the room and pt required minA for stability while ambulating with no AD. Recommend continued use of RW due to falls risk. Pt concluded session seated in recliner, safety belt alarm on, all needs in reach.  ? ?Therapy Documentation ?Precautions:  ?Precautions ?Precautions: Fall ?Precaution Comments: Bilateral foot drop (chronic, does not wear AFOs). Story teller, tangential speech ?Restrictions ?Weight Bearing Restrictions: No ?General: ?  ? ?Agitated Behavior Scale: ?TBI ?Observation Details ?Observation Environment: CIR ?Start of observation period - Date: 04/26/21 ?Start of observation period - Time: 1300 ?End of observation period - Date: 04/26/21 ?End of observation period - Time: 1400 ?Agitated Behavior Scale (DO NOT LEAVE BLANKS) ?Short attention span, easy distractibility, inability to concentrate: Present to a slight degree ?Impulsive, impatient, low tolerance for pain or frustration: Present to a slight degree ?Uncooperative, resistant to care, demanding: Absent ?Violent and/or threatening violence toward people or property: Absent ?Explosive and/or unpredictable anger: Absent ?Rocking, rubbing, moaning, or other self-stimulating behavior: Absent ?Pulling at tubes, restraints, etc.: Absent ?Wandering from treatment areas: Absent ?Restlessness, pacing, excessive movement: Absent ?Repetitive behaviors, motor, and/or verbal: Absent ?Rapid, loud, or excessive talking: Present to a slight degree ?Sudden changes of mood: Absent ?Easily initiated or excessive crying and/or laughter: Absent ?Self-abusiveness, physical and/or verbal: Absent ?Agitated behavior scale total score: 17 ? ?Therapy/Group: Individual Therapy ? ?Karlea Mckibbin P Sophee Mckimmy ?04/26/2021, 2:10 PM  ?

## 2021-04-26 NOTE — Progress Notes (Signed)
?                                                       PROGRESS NOTE ? ? 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 ?ROS: +hallucinations, constipation improving ? ? ?Objective: ?  ?No results found. ?Recent Labs  ?  04/24/21 ?0059 04/25/21 ?[id number]  ?WBC 13.0* 11.8*  ?HGB 11.6* 12.0*  ?HCT 32.9* 34.1*  ?PLT 261 242  ? ? ?Recent Labs  ?  04/24/21 ?0059 04/25/21 ?[id number]  ?NA 125* 128*  ?K 3.2* 3.8  ?CL 93* 93*  ?CO2 26 28  ?GLUCOSE 147* 105*  ?BUN 17 17  ?CREATININE 0.58* 0.68  ?CALCIUM 8.0* 7.9*  ? ? ?No intake or output data in the 24 hours ending 04/26/21 0931 ?  ? ?  ? ?Physical Exam: ?Vital Signs ?Blood pressure 129/79, pulse 77, temperature (!) 97.5 ?F (36.4 ?C), temperature source Oral, resp. rate 18, height 5\' 11"  (1.803 m), weight 85.1 kg, SpO2 97 %. ?Gen: no distress, normal appearing, BMI 26.17 ? ?General: No acute distress ?Mood and affect are appropriate ?Heart: Regular rate and rhythm no rubs murmurs or extra sounds ?Lungs: Clear to auscultation, breathing unlabored, no rales or wheezes ?Abdomen: Positive bowel sounds, soft nontender to palpation, nondistended ?Extremities: No clubbing, cyanosis, or edema ?Skin: No evidence of breakdown, no evidence of rash ?Neurologic: Cranial nerves II through XII intact, motor strength is 5/5 in bilateral deltoid, bicep, tricep, grip, hip flexor, knee extensors, ankle dorsiflexor and plantar flexor ?Sensory exam normal sensation to light touch and proprioception in bilateral upper and lower extremities ?Cerebellar exam normal finger to nose to finger as well as heel to shin in bilateral upper and lower extremities ?Musculoskeletal: Full range of motion in all 4 extremities. No joint swelling ? ?Neurological:  ?   Mental Status: He is alert and oriented to person, place, and time.  ?   Comments: Left facial weakness with dysarthric speech. Alert. Memory intact.  Tangential, impaired attention. He was able to follow simple motor commands.  Can ambulate with wife's assistance in room. 4/5 strength throughout ?Looks unchanged 3/31 ?Assessment/Plan: ?1. Functional deficits which require 3+ hours per day of interdisciplinary therapy in a comprehensive inpatient rehab setting. ?Physiatrist is providing close team supervision and 24 hour management of active medical problems listed below. ?Physiatrist and rehab team continue to assess barriers to discharge/monitor patient progress toward functional and medical goals ? ?Care Tool: ? ?Bathing ?   ?   ?   ?  ?  ?Bathing assist Assist Level: Minimal Assistance - Patient > 75% ?  ?  ?Upper Body Dressing/Undressing ?Upper body dressing   ?  ?   ?Upper body assist Assist Level: Supervision/Verbal cueing ?   ?Lower Body Dressing/Undressing ?Lower body dressing ? ? ?   ?  ? ?  ? ?Lower body assist Assist for lower body dressing: Minimal Assistance - Patient > 75% ?   ? ?Toileting ?Toileting    ?Toileting assist Assist for toileting: Independent with assistive device ?Assistive Device Comment: urinal ?  ?Transfers ?Chair/bed transfer ? ?Transfers assist ?   ? ?Chair/bed transfer assist level: Contact Guard/Touching assist ?  ?  ?Locomotion ?Ambulation ? ? ?Ambulation assist ? ?   ? ?  Assist level: Minimal Assistance - Patient > 75% ?Assistive device: Walker-rolling ?Max distance: 149ft  ? ?Walk 10 feet activity ? ? ?Assist ?   ? ?Assist level: Minimal Assistance - Patient > 75% ?Assistive device: Walker-rolling  ? ?Walk 50 feet activity ? ? ?Assist   ? ?Assist level: Minimal Assistance - Patient > 75% ?Assistive device: Walker-rolling  ? ? ?Walk 150 feet activity ? ? ?Assist   ? ?Assist level: Minimal Assistance - Patient > 75% ?Assistive device: Walker-rolling ?  ? ?Walk 10 feet on uneven surface  ?activity ? ? ?Assist Walk 10 feet on uneven surfaces activity did not occur: Safety/medical concerns ? ? ?  ?   ? ?Wheelchair ? ? ? ? ?Assist Is  the patient using a wheelchair?: No ?  ?  ? ?  ?   ? ? ?Wheelchair 50 feet with 2 turns activity ? ? ? ?Assist ? ?  ?  ? ? ?   ? ?Wheelchair 150 feet activity  ? ? ? ?Assist ?   ? ? ?   ? ?Blood pressure 129/79, pulse 77, temperature (!) 97.5 ?F (36.4 ?C), temperature source Oral, resp. rate 18, height 5\' 11"  (1.803 m), weight 85.1 kg, SpO2 97 %. ? ? ? Medical Problem List and Plan: ?1. 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            -patient may shower ?            -ELOS/Goals: 6-8 days S ?           Continue CIR PT, OT, SLP ?2.  Antithrombotics: ?-DVT/anticoagulation:  Mechanical: Sequential compression devices, below knee Bilateral lower extremities ?            -antiplatelet therapy: N/a ?3. Pain: continue tylenol prn.  ?4. Mood: LCSW to follow for evaluation and support.  ?            -antipsychotic agents: N/A ?5. Neuropsych: This patient may be intermittently capable of making decisions on his own behalf. ?6. Skin/Wound Care: Routine pressure relief measures.  ?7. Fluids/Electrolytes/Nutrition: Strict I/O.  ?8. Progressive hyponatremia: improving. Decrease fluid restriction to 1859mL.  Continue salt tabs tid.  ? 9. CAF: Monitor HR TID--restart Xarelto as per Dr. Ronnald Ramp- no signs of neuro decline 3/31 ?            --follow up with Dr. Terri Skains after discharge.  ?10. Melena/H/o GIB/AVM: Had black stools yesterday--usually has BM every 5 days. Resume colace.  ?--H/H today stable. Recheck CBC in am.  ?--monitor for hematochezia/drop in H/H ?11. Chronic diastolic CHF: Monitor weights daily and for signs of overload. ?            --continue Crestor, Hydralazine, Entresto and Demadex. ?12. H/o IPF/CAP: Continue MDI. Has completed antibiotic course.  ?-- Cough resolving with  tessalon perles/Mucinex ?13. Hallucinations: Has been ongoing and he reports  2 episodes yesterday. Improving ?--Telesitter for safety.  ?14. Nightmares/Chronic insomnia: Continue Melatonin 3 mg (has nightmares  with higher doses) ?15. Hypokalemia: Continue klor 20 BID.  ?16. Screening for vitamin D deficiency: vitamin D level is 101- no need for supplementation.  ?17. Suboptimal magnesium: discuss starting supplement at night with patient and wife ?18. Acute blood loss anemia: check iron and B12 tomorrow morning. ?19. Overweight BMI 26.17: provide dietary education ? ?LOS: ?2 days ?A FACE TO FACE EVALUATION WAS PERFORMED ? ?Calvin Brown ?04/26/2021, 9:31 AM  ? ?  ?

## 2021-04-26 NOTE — Progress Notes (Signed)
Occupational Therapy Session Note ? ?Patient Details  ?Name: Calvin Brown ?MRN: [id number] ?Date of Birth: [birth year] ? ?Today's Date: 04/26/2021 ?OT Individual Time: 0845-1000 ?OT Individual Time Calculation (min): 75 min  ? ? ?Short Term Goals: ?Week 1:  OT Short Term Goal 1 (Week 1): STGs=LTGs due to ELOS ? ?Skilled Therapeutic Interventions/Progress Updates:  ?  Pt semi reclined in bed, no pain, very happy to have the chance to bathe at shower level.  Pt required increased time to complete all tasks due to significantly internally distracted and tangential of speech needing intermittent gentle cues to redirect to task at hand. Pt completed supine to sit using bed features with CGA.  Sit to stand and ambulated to bathroom and walk in shower transfer at transfer bench using RW with CGA.  Pt needed cues for safety during undressing to initiate seated doffing instead of completing in standing due to unsteady balance.  Pt used grab bars and needed CGA overall for UB/LB dressing and bathing at sit<>stand level.  Pt dried off with mod assist and donned pull up brief with min assist (although anticipate pt could complete at higher performance level but telling therapist "You just do it").  Educated pt on benefits of completing tasks with maximal independence to facilitate progress but having difficulty attending to education due to distractibility.  Pt completed ambulation using RW to sink and sat at w/c with CGA.  Donned shirt and brushed teeth with supervision cues for sustained attention.  Pt donned pants with min assist, compression socks with total assist and grip socks with total assist (again pt not receptive to trying this with more independence).  Stand pivot to recliner with CGA and cues for small space negotiation problem solving.  Call bell and needs in reach, seat alarm on.  ? ?Therapy Documentation ?Precautions:  ?Precautions ?Precautions: Fall ?Precaution Comments: Bilateral foot drop (chronic, does not wear  AFOs). Story teller, tangential speech ?Restrictions ?Weight Bearing Restrictions: No ? ? ?Therapy/Group: Individual Therapy ? ?Dian Situ Margrete Delude ?04/26/2021, 12:29 PM ?

## 2021-04-26 NOTE — IPOC Note (Signed)
Overall Plan of Care (IPOC) ?Patient Details ?Name: Calvin Brown ?MRN: [id number] ?DOB: 06-07-1933 ? ?Admitting Diagnosis: TBI (traumatic brain injury) (HCC) ? ?Hospital Problems: Principal Problem: ?  TBI (traumatic brain injury) ? ? ? ? Functional Problem List: ?Nursing Bladder, Endurance, Safety, Medication Management  ?PT Balance, Behavior, Endurance, Motor, Safety  ?OT Balance, Motor, Behavior, Cognition, Endurance, Safety  ?SLP Cognition  ?TR    ?    ? Basic ADL?s: ?OT Grooming, Bathing, Dressing, Toileting  ? ?  Advanced  ADL?s: ?OT    ?   ?Transfers: ?PT Bed Mobility, Bed to Chair, Car  ?OT Toilet, Tub/Shower  ? ?  Locomotion: ?PT Ambulation, Stairs  ? ?  Additional Impairments: ?OT Fuctional Use of Upper Extremity  ?SLP Social Cognition ?  ?Attention, Memory, Problem Solving  ?TR    ? ? ?Anticipated Outcomes ?Item Anticipated Outcome  ?Self Feeding    ?Swallowing ?   ?  ?Basic self-care ? supervision  ?Toileting ? supervision ?  ?Bathroom Transfers supervision  ?Bowel/Bladder ? manage bladder w mod I  ?Transfers ? supervision with LRAD  ?Locomotion ? supervision with LRAD  ?Communication ?    ?Cognition ? sup A  ?Pain ? N/A  ?Safety/Judgment ? maintain safety w cues  ? ?Therapy Plan: ?PT Intensity: Minimum of 1-2 x/day ,45 to 90 minutes ?PT Frequency: 5 out of 7 days ?PT Duration Estimated Length of Stay: 7-9 days ?OT Intensity: Minimum of 1-2 x/day, 45 to 90 minutes ?OT Frequency: 5 out of 7 days ?OT Duration/Estimated Length of Stay: 7-9 days ?SLP Intensity: Minumum of 1-2 x/day, 30 to 90 minutes ?SLP Frequency: 3 to 5 out of 7 days ?SLP Duration/Estimated Length of Stay: 7-9 days  ? ?Due to the current state of emergency, patients may not be receiving their 3-hours of Medicare-mandated therapy. ? ? Team Interventions: ?Nursing Interventions Bladder Management, Disease Management/Prevention, Medication Management, Discharge Planning, Patient/Family Education  ?PT interventions Discharge planning, Functional  mobility training, Ambulation/gait training, Psychosocial support, Therapeutic Activities, Visual/perceptual remediation/compensation, Wheelchair propulsion/positioning, Therapeutic Exercise, Skin care/wound management, Neuromuscular re-education, Balance/vestibular training, Disease management/prevention, Cognitive remediation/compensation, DME/adaptive equipment instruction, Pain management, Splinting/orthotics, UE/LE Strength taining/ROM, UE/LE Coordination activities, Stair training, Patient/family education, Functional electrical stimulation, Community reintegration  ?OT Interventions Warden/ranger, Community reintegration, Disease mangement/prevention, Development worker, international aid stimulation, Neuromuscular re-education, Patient/family education, Self Care/advanced ADL retraining, Therapeutic Exercise, UE/LE Coordination activities, Splinting/orthotics, Cognitive remediation/compensation, Discharge planning, DME/adaptive equipment instruction, Functional mobility training, Psychosocial support, Therapeutic Activities, UE/LE Strength taining/ROM  ?SLP Interventions Cognitive remediation/compensation, Internal/external aids, Patient/family education, Speech/Language facilitation, Functional tasks  ?TR Interventions    ?SW/CM Interventions Discharge Planning, Psychosocial Support, Patient/Family Education  ? ?Barriers to Discharge ?MD  Medical stability  ?Nursing Home environment access/layout, Decreased caregiver support ?1 level 3 ste left rail w spouse; used SPC right hand PTA  ?PT Home environment access/layout, Insurance for SNF coverage, Behavior ?   ?OT Behavior, Medication compliance, Incontinence ?   ?SLP   ?   ?SW   ?   ? ?Team Discharge Planning: ?Destination: PT-Home ,OT-   , SLP-Home ?Projected Follow-up: PT-Home health PT, OT-  Home health OT, Outpatient OT, SLP-24 hour supervision/assistance ?Projected Equipment Needs: PT-To be determined, OT- To be determined, SLP-None recommended by  SLP ?Equipment Details: PT-Pt owns quad cane, OT-has quad cane per pt ?Patient/family involved in discharge planning: PT- Patient,  OT- , SLP-Patient, Family member/caregiver ? ?MD ELOS: 7-10d ?Medical Rehab Prognosis:  Good ?Assessment: The patient has been admitted for CIR therapies with the  diagnosis of TBI. The team will be addressing functional mobility, strength, stamina, balance, safety, adaptive techniques and equipment, self-care, bowel and bladder mgt, patient and caregiver education, resumption of Xarelto in monitored setting . Goals have been set at sup. Anticipated discharge destination is home. ? ?Due to the current state of emergency, patients may not be receiving their 3 hours per day of Medicare-mandated therapy. ? ? ?See Team Conference Notes for weekly updates to the plan of care  ?

## 2021-04-26 NOTE — Progress Notes (Signed)
Speech Language Pathology TBI Note ? ?Patient Details  ?Name: Calvin Brown ?MRN: [id number] ?Date of Birth: October 13, 1933 ? ?Today's Date: 04/26/2021 ?SLP Individual Time: 3532-9924 ?SLP Individual Time Calculation (min): 40 min ? ?Short Term Goals: ?Week 1: SLP Short Term Goal 1 (Week 1): STG=LTG due to ELOS ? ?Skilled Therapeutic Interventions: ?Pt seen this date for skilled ST intervention targeting cognitive-linguistic goals. Pt encountered awake/alert and sitting upright in bed; finishing breakfast. Persistent cough observed. Pt agreeable to ST intervention at bedside. ? ?SLP facilitated today's session by providing skilled education and treatment targeting implementation of compensatory attention strategies when completing pill organizer task. Following education and Min verbal A for reviewing pill organizer set-up prior to medication placement, pt correctly placed 2 out of 2 medication given Supervision A. Furthermore, selected appropriate pill organizer for current schedule of medications given Supervision A. Recalled purpose of ~80% of his medications with Mod I. During pill organizer task, pt reports the need for tolieting. Ambulated to bathroom with RW and Min A with Min verbal A for safety with use of RW. Of note, pt did not attempt to stand without assistance while in the bathroom. ? ?Session concluded with pt in the bathroom and direct hand off to NT to further assist with tolieting needs. Continue with current ST POC next session. ? ?Pain ?Denies pain; NAD ? ?Agitated Behavior Scale: ?TBI ?Observation Details ?Observation Environment: patients room ?Start of observation period - Date: 04/26/21 ?Start of observation period - Time: 0730 ?End of observation period - Date: 04/26/21 ?End of observation period - Time: 0815 ? ?Agitated Behavior Scale (DO NOT LEAVE BLANKS) ?Short attention span, easy distractibility, inability to concentrate: Present to a slight degree ?Impulsive, impatient, low tolerance for pain  or frustration: Present to a slight degree ?Uncooperative, resistant to care, demanding: Absent ?Violent and/or threatening violence toward people or property: Absent ?Explosive and/or unpredictable anger: Absent ?Rocking, rubbing, moaning, or other self-stimulating behavior: Absent ?Pulling at tubes, restraints, etc.: Absent ?Wandering from treatment areas: Absent ?Restlessness, pacing, excessive movement: Absent ?Repetitive behaviors, motor, and/or verbal: Absent ?Rapid, loud, or excessive talking: Absent ?Sudden changes of mood: Absent ?Easily initiated or excessive crying and/or laughter: Absent ?Self-abusiveness, physical and/or verbal: Absent ?Agitated behavior scale total score: 16 ? ?Therapy/Group: Individual Therapy ? ?Shela Esses A Danyon Mcginness ?04/26/2021, 12:51 PM ?

## 2021-04-27 LAB — BASIC METABOLIC PANEL
Anion gap: 5 (ref 5–15)
BUN: 14 mg/dL (ref 8–23)
CO2: 26 mmol/L (ref 22–32)
Calcium: 7.8 mg/dL — ABNORMAL LOW (ref 8.9–10.3)
Chloride: 98 mmol/L (ref 98–111)
Creatinine, Ser: 0.77 mg/dL (ref 0.61–1.24)
GFR, Estimated: >60 mL/min (ref >60)
Glucose, Bld: 96 mg/dL (ref 70–99)
Potassium: 4.4 mmol/L (ref 3.5–5.1)
Sodium: 129 mmol/L — ABNORMAL LOW (ref 135–145)

## 2021-04-27 NOTE — Progress Notes (Signed)
Physical Therapy TBI Note ? ?Patient Details  ?Name: Calvin Brown ?MRN: [id number] ?Date of Birth: 1933/11/15 ? ?Today's Date: 04/27/2021 ?PT Individual Time: 6440-3474 ?PT Individual Time Calculation (min): 57 min  ? ?Short Term Goals: ?Week 1:  PT Short Term Goal 1 (Week 1): STG = LTG due to ELOS ? ?Skilled Therapeutic Interventions/Progress Updates:  ?  Pt received supine in bed awaiting therapist arrival and agreeable to session. Donned pt's personal knee high TED hose total assist. Supine>sitting L EOB, HOB flat and not using bedrails, with supervision. Sitting EOB threaded on pants  min assist and donned shoes with bilateral [place] PLS AFOs total assist - pt appears excited about the braces and ensures therapist places them in his shoes. Sit>stand EOB>RW with CGA - pulled pants up over hips without assist.  ? ?Gait in/out bathroom using RW with CGA - demos adequate foot clearance bilaterally in AFOs - pt states "these really do pick my feet up" referring to the AFOs.  ? ?Sit<>stand to/from toilet using grab bar support with CGA. Continent of bladder. Standing with CGA performed LB clothing management without assist. ? ?Therapist educating pt on proper/safe AD management at sink - standing hand hygiene with CGA/close supervision. Pt requesting to sit to perform oral care - completed with set-up assist.  ? ?Gait training ~112ft using RW to main therapy gym with CGA - achieves reciprocal stepping pattern with adequate foot clearance bilaterally wearing AFOs - requires frequent cuing to maintain trunk upright and not flex forward towards RW. ? ?Therapist retrieved taller RW for pt to improve upright posture. ? ?Gait in gym using RW with CGA. Stair navigation ascending/descending 4 (6" height) steps using B HRs with step-to pattern leading with R LE on ascent and descent; however, therapist cued for pt to lead with LLE on descent as he states this is his weaker LE - CGA/light min assist for balance during. ? ?Dynamic  standing balance task via forward/backwards stepping over hockey stick with min assist for balance x 3 reps with pt reporting increased fear of falling with this task and stating he needs to work on his balance. Therapist educated him on importance of challenging his balance during therapy to improve it.  ? ?Pt hyperverbal throughout session but is able to be redirected onto tasks with min cuing.  ? ?Gait training ~163ft back to room using RW with CGA progressing towards min assist due to fatigue with pt starting to achieve a slight crouched gait posture and just overall increased postural sway with decreased coordination in LEs due to fatigue.  ? ?Pt left seated in w/c with needs in reach, seat belt alarm on, and telesitter in place. ? ?Therapy Documentation ?Precautions:  ?Precautions ?Precautions: Fall ?Precaution Comments: Bilateral foot drop (chronic, does not wear AFOs). Story teller, tangential speech ?Restrictions ?Weight Bearing Restrictions: No ? ? ?Pain: ?  No reports of pain throughout session.  ? ?Agitated Behavior Scale: ?TBI  ?Observation Details ?Observation Environment: CIR ?Start of observation period - Date: 04/27/21 ?Start of observation period - Time: 0805 ?End of observation period - Date: 04/27/21 ?End of observation period - Time: 0902 ?Agitated Behavior Scale (DO NOT LEAVE BLANKS) ?Short attention span, easy distractibility, inability to concentrate: Present to a slight degree ?Impulsive, impatient, low tolerance for pain or frustration: Present to a slight degree ?Uncooperative, resistant to care, demanding: Absent ?Violent and/or threatening violence toward people or property: Absent ?Explosive and/or unpredictable anger: Absent ?Rocking, rubbing, moaning, or other self-stimulating behavior: Absent ?Pulling at  tubes, restraints, etc.: Absent ?Wandering from treatment areas: Absent ?Restlessness, pacing, excessive movement: Absent ?Repetitive behaviors, motor, and/or verbal: Absent ?Rapid,  loud, or excessive talking: Present to a slight degree ?Sudden changes of mood: Absent ?Easily initiated or excessive crying and/or laughter: Absent ?Self-abusiveness, physical and/or verbal: Absent ?Agitated behavior scale total score: 17  ? ? ?Therapy/Group: Individual Therapy ? ?Ginny Forth , PT, DPT, NCS, CSRS ? ?04/27/2021, 7:48 AM  ?

## 2021-04-27 NOTE — Progress Notes (Signed)
Physical Therapy TBI Note ? ?Patient Details  ?Name: Calvin Brown ?MRN: [id number] ?Date of Birth: 1933-06-15 ? ?Today's Date: 04/27/2021 ?PT Individual Time: [id number] ?PT Individual Time Calculation (min): 28 min  ? ?Short Term Goals: ?Week 1:  PT Short Term Goal 1 (Week 1): STG = LTG due to ELOS ? ?Skilled Therapeutic Interventions/Progress Updates:  ?  Pt received sitting in recliner with his son, [name], present and pt agreeable to therapy session. Pt reports he really likes the AFOs - therapist donned shoes and bilateral [place] walk-on PLS AFO with total assist for time. Sit<>stands using RW with CGA for steadying throughout session - cuing to push up with 1 hand from seat. Gait training ~186ft to main therapy gym using RW with CGA for steadying - cuing to maintain upright posture - pt achieves reciprocal stepping pattern with heel strike on initial contact bilaterally while wearing braces.  ? ?Pt requesting to trial his narrow-based quad cane (NBQC) from home that his son brought. Gait training ~66ft using NBQC in R UE with min assist for balance - pt has increased postural instability with a slight crouched posturing compared to when using RW. Therapist educated pt that at this time recommending he use RW for standing/ambulation at home but will defer final recommendation to primary PT. ? ?Dynamic standing balance task, no UE support, attempting R/L side stepping over hockey stick with consistent posterior LOB requiring heavy mod assist to maintain upright with pt having poor balance recovery strategies - removed hockey stick and tried just R/L lateral side stepping with pt having slowly improving balance requiring heavy min assist with intermittent mod assist - continues to have consistent posterior LOB. Educated pt and son on pt's limited ability to utilize ankle strategy to recovery posterior LOB due to weak tibialis anteriors therefore educated pt on his need to use stepping strategy instead. Pt would  benefit from stepping balance recovery training. ? ?Gait training ~176ft back to room using RW with CGA/intermittent min assist for balance due to fatigue resulting in increased postural sway and slight crouched posture. ? ?Pt left seated in recliner with needs in reach, seat belt alarm on, meal tray in place, and pt's son present. ? ?Therapy Documentation ?Precautions:  ?Precautions ?Precautions: Fall ?Precaution Comments: Bilateral foot drop (chronic, does not wear AFOs). Story teller, tangential speech ?Restrictions ?Weight Bearing Restrictions: No ? ? ?Pain: ?No reports of pain throughout session. ? ?Agitated Behavior Scale: ?TBI  ?Observation Details ?Observation Environment: CIR ?Start of observation period - Date: 04/27/21 ?Start of observation period - Time: 1705 ?End of observation period - Date: 04/27/21 ?End of observation period - Time: [id number] ?Agitated Behavior Scale (DO NOT LEAVE BLANKS) ?Short attention span, easy distractibility, inability to concentrate: Present to a slight degree ?Impulsive, impatient, low tolerance for pain or frustration: Present to a slight degree ?Uncooperative, resistant to care, demanding: Absent ?Violent and/or threatening violence toward people or property: Absent ?Explosive and/or unpredictable anger: Absent ?Rocking, rubbing, moaning, or other self-stimulating behavior: Absent ?Pulling at tubes, restraints, etc.: Absent ?Wandering from treatment areas: Absent ?Restlessness, pacing, excessive movement: Absent ?Repetitive behaviors, motor, and/or verbal: Absent ?Rapid, loud, or excessive talking: Present to a slight degree ?Sudden changes of mood: Absent ?Easily initiated or excessive crying and/or laughter: Absent ?Self-abusiveness, physical and/or verbal: Absent ?Agitated behavior scale total score: 17 ? ? ? ? ? ?Therapy/Group: Individual Therapy ? ?Tawana Scale , PT, DPT, NCS, CSRS ?04/27/2021, 3:31 PM  ?

## 2021-04-27 NOTE — Progress Notes (Signed)
Occupational Therapy Session Note ? ?Patient Details  ?Name: Calvin Brown ?MRN: [id number] ?Date of Birth: 20-Sep-1933 ? ?Today's Date: 04/27/2021 ?OT Individual Time: 1000-1100 ?OT Individual Time Calculation (min): 60 min  ? ? ?Short Term Goals: ?Week 1:  OT Short Term Goal 1 (Week 1): STGs=LTGs due to ELOS ? ?Skilled Therapeutic Interventions/Progress Updates:  ?  Pt received sitting in w/c agreeable to OT and verbalizing fatigue from previous session. Pt propelled self to nurses station to get snack, focusing on UB strength and endurance. Engaged in dynamic standing balance activities progressing from standing with RW to standing unsupported with min A. Pt showed slight posterior LOB 2x when standing unsupported, however able to correct. At end of session, pt returned to room and transitioned to bed with min A. Pt left with all needs in reach.  ? ?Therapy Documentation ?Precautions:  ?Precautions ?Precautions: Fall ?Precaution Comments: Bilateral foot drop (chronic, does not wear AFOs). Story teller, tangential speech ?Restrictions ?Weight Bearing Restrictions: No ?General: ?  ?Vital Signs: ?  ?Pain: ?Pain Assessment ?Pain Scale: 0-10 ?Pain Score: 0-No pain ?ADL: ?ADL ?Grooming: Supervision/safety ?Where Assessed-Grooming: Sitting at sink ?Upper Body Bathing: Supervision/safety ?Where Assessed-Upper Body Bathing: Sitting at sink ?Lower Body Bathing: Minimal assistance ?Where Assessed-Lower Body Bathing: Standing at sink, Sitting at sink ?Upper Body Dressing: Supervision/safety ?Where Assessed-Upper Body Dressing: Sitting at sink ?Lower Body Dressing: Minimal assistance ?Where Assessed-Lower Body Dressing: Sitting at sink, Standing at sink ?Vision ?  ?Perception  ?  ?Praxis ?  ?Balance ?  ?Exercises: ?  ?Other Treatments:   ? ? ?Therapy/Group: Individual Therapy ? ?Gearldean Lomanto, Shelby N ?04/27/2021, 11:02 AM ?

## 2021-04-27 NOTE — Progress Notes (Signed)
Speech Language Pathology TBI Note ? ?Patient Details  ?Name: Calvin Brown ?MRN: [id number] ?Date of Birth: 1933-07-14 ? ?Today's Date: 04/27/2021 ?SLP Individual Time: 1417-1500 ?SLP Individual Time Calculation (min): 43 min ? ?Short Term Goals: ?Week 1: SLP Short Term Goal 1 (Week 1): STG=LTG due to ELOS ? ?Skilled Therapeutic Interventions: ?Pt seen for skilled ST with focus on cognitive goals, pt in recliner sleeping but easily roused. Pt continues to report occasional hallucinations that have been present since fall, however he is aware they are not real and that they are hallucinations. Pt benefits from Supervision A cues for accuracy related to recent medical events and timeline. SLP facilitating recall of current medications, pt with overall Supervision A cues to recall names/dosages/administration times. Pt very verbose throughout session, however topic maintenance intact. Supervision A cues for sustained attention in functional conversation. Wife present and endorses pt is at cognitive baseline. Recommend 1x f/u tx for cognitive function and discharge. Pt left in recliner with wife present for needs. Cont ST POC.  ? ?Pain ?Pain Assessment ?Pain Scale: 0-10 ?Pain Score: 0-No pain ? ?Agitated Behavior Scale: ?TBI ?Observation Details ?Observation Environment: CIR ?Start of observation period - Date: 04/27/21 ?Start of observation period - Time: 1415 ?End of observation period - Date: 04/27/21 ?End of observation period - Time: 1500 ?Agitated Behavior Scale (DO NOT LEAVE BLANKS) ?Short attention span, easy distractibility, inability to concentrate: Present to a slight degree ?Impulsive, impatient, low tolerance for pain or frustration: Absent ?Uncooperative, resistant to care, demanding: Absent ?Violent and/or threatening violence toward people or property: Absent ?Explosive and/or unpredictable anger: Absent ?Rocking, rubbing, moaning, or other self-stimulating behavior: Absent ?Pulling at tubes, restraints,  etc.: Absent ?Wandering from treatment areas: Absent ?Restlessness, pacing, excessive movement: Absent ?Repetitive behaviors, motor, and/or verbal: Absent ?Rapid, loud, or excessive talking: Present to a slight degree ?Sudden changes of mood: Absent ?Easily initiated or excessive crying and/or laughter: Absent ?Self-abusiveness, physical and/or verbal: Absent ?Agitated behavior scale total score: 16 ? ?Therapy/Group: Individual Therapy ? ?Tacey Ruiz ?04/27/2021, 2:57 PM ?

## 2021-04-28 LAB — BASIC METABOLIC PANEL
Anion gap: 6 (ref 5–15)
BUN: 10 mg/dL (ref 8–23)
CO2: 26 mmol/L (ref 22–32)
Calcium: 8 mg/dL — ABNORMAL LOW (ref 8.9–10.3)
Chloride: 99 mmol/L (ref 98–111)
Creatinine, Ser: 0.68 mg/dL (ref 0.61–1.24)
GFR, Estimated: >60 mL/min (ref >60)
Glucose, Bld: 102 mg/dL — ABNORMAL HIGH (ref 70–99)
Potassium: 4.4 mmol/L (ref 3.5–5.1)
Sodium: 131 mmol/L — ABNORMAL LOW (ref 135–145)

## 2021-04-28 NOTE — Progress Notes (Signed)
?                                                       PROGRESS NOTE ? ? ?Subjective/Complaints: ? ? ?PT reports B AFOs were helpful for gait training  ? ?ROS: +hallucinations, constipation improving ? ? ?Objective: ?  ?No results found. ?No results for input(s): WBC, HGB, HCT, PLT in the last 72 hours. ? ?Recent Labs  ?  04/27/21 ?0532 04/28/21 ?[id number]  ?NA 129* 131*  ?K 4.4 4.4  ?CL 98 99  ?CO2 26 26  ?GLUCOSE 96 102*  ?BUN 14 10  ?CREATININE 0.77 0.68  ?CALCIUM 7.8* 8.0*  ? ? ? ?Intake/Output Summary (Last 24 hours) at 04/28/2021 1217 ?Last data filed at 04/28/2021 [id number] ?Tesar per 24 hour  ?Intake 840 ml  ?Output --  ?Net 840 ml  ? ?  ? ?  ? ?Physical Exam: ?Vital Signs ?Blood pressure 120/66, pulse 85, temperature 98.3 ?F (36.8 ?C), temperature source Oral, resp. rate 18, height 5\' 11"  (1.803 m), weight 85.1 kg, SpO2 97 %. ?Gen: no distress, normal appearing, BMI 26.17 ? ?General: No acute distress ?Mood and affect are appropriate ?Heart: Regular rate and rhythm no rubs murmurs or extra sounds ?Lungs: Clear to auscultation, breathing unlabored, no rales or wheezes ?Abdomen: Positive bowel sounds, soft nontender to palpation, nondistended ?Extremities: No clubbing, cyanosis, or edema ?Skin: No evidence of breakdown, no evidence of rash ?Neurologic: Cranial nerves II through XII intact, motor strength is 5/5 in bilateral deltoid, bicep, tricep, grip, hip flexor, knee extensors, ankle dorsiflexor and plantar flexor ?Sensory exam normal sensation to light touch and proprioception in bilateral upper and lower extremities ?Cerebellar exam normal finger to nose to finger as well as heel to shin in bilateral upper and lower extremities ?Musculoskeletal: Full range of motion in all 4 extremities. No joint swelling ? ?Neurological:  ?   Mental Status: He is alert and oriented to person, place, and time.  ?   Comments: Left facial weakness with dysarthric speech. Alert. Memory intact. Tangential, impaired attention. He was  able to follow simple motor commands.  4/5 strength throughout ? ?Assessment/Plan: ?1. Functional deficits which require 3+ hours per day of interdisciplinary therapy in a comprehensive inpatient rehab setting. ?Physiatrist is providing close team supervision and 24 hour management of active medical problems listed below. ?Physiatrist and rehab team continue to assess barriers to discharge/monitor patient progress toward functional and medical goals ? ?Care Tool: ? ?Bathing ?   ?   ?   ?  ?  ?Bathing assist Assist Level: Minimal Assistance - Patient > 75% ?  ?  ?Upper Body Dressing/Undressing ?Upper body dressing   ?  ?   ?Upper body assist Assist Level: Supervision/Verbal cueing ?   ?Lower Body Dressing/Undressing ?Lower body dressing ? ? ?   ?  ? ?  ? ?Lower body assist Assist for lower body dressing: Minimal Assistance - Patient > 75% ?   ? ?Toileting ?Toileting    ?Toileting assist Assist for toileting: Independent with assistive device ?Assistive Device Comment: urinal ?  ?Transfers ?Chair/bed transfer ? ?Transfers assist ?   ? ?Chair/bed transfer assist level: Contact Guard/Touching assist ?Chair/bed transfer assistive device: Walker ?  ?Locomotion ?Ambulation ? ? ?Ambulation assist ? ?   ? ?Assist level: Contact Guard/Touching assist ?Assistive device:  Walker-rolling ?Max distance: 164ft  ? ?Walk 10 feet activity ? ? ?Assist ?   ? ?Assist level: Minimal Assistance - Patient > 75% ?Assistive device: Walker-rolling  ? ?Walk 50 feet activity ? ? ?Assist   ? ?Assist level: Minimal Assistance - Patient > 75% ?Assistive device: Walker-rolling  ? ? ?Walk 150 feet activity ? ? ?Assist   ? ?Assist level: Minimal Assistance - Patient > 75% ?Assistive device: Walker-rolling ?  ? ?Walk 10 feet on uneven surface  ?activity ? ? ?Assist Walk 10 feet on uneven surfaces activity did not occur: Safety/medical concerns ? ? ?  ?   ? ?Wheelchair ? ? ? ? ?Assist Is the patient using a wheelchair?: No ?  ?  ? ?  ?   ? ? ?Wheelchair  50 feet with 2 turns activity ? ? ? ?Assist ? ?  ?  ? ? ?   ? ?Wheelchair 150 feet activity  ? ? ? ?Assist ?   ? ? ?   ? ?Blood pressure 120/66, pulse 85, temperature 98.3 ?F (36.8 ?C), temperature source Oral, resp. rate 18, height 5\' 11"  (1.803 m), weight 85.1 kg, SpO2 97 %. ? ? ? Medical Problem List and Plan: ?1. 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            -patient may shower ?            -ELOS/Goals: 6-8 days S ?           Continue CIR PT, OT, SLP ?2.  Antithrombotics: ?-DVT/anticoagulation:  Mechanical: Sequential compression devices, below knee Bilateral lower extremities ?            -antiplatelet therapy: N/a ?3. Pain: continue tylenol prn.  ?4. Mood: LCSW to follow for evaluation and support.  ?            -antipsychotic agents: N/A ?5. Neuropsych: This patient may be intermittently capable of making decisions on his own behalf. ?6. Skin/Wound Care: Routine pressure relief measures.  ?7. Fluids/Electrolytes/Nutrition: Strict I/O.  ?8. Progressive hyponatremia: improving. Decrease fluid restriction to 1861mL.  Continue salt tabs tid.  ? 9. CAF: Monitor HR TID--restart Xarelto as per Dr. Ronnald Ramp- no signs of neuro decline 3/31 ?            --follow up with Dr. Terri Skains after discharge.  ?10. Melena/H/o GIB/AVM: Had black stools yesterday--usually has BM every 5 days. Resume colace.  ?--H/H today stable. Recheck CBC in am.  ?--monitor for hematochezia/drop in H/H ?11. Chronic diastolic CHF: Monitor weights daily and for signs of overload. ?            --continue Crestor, Hydralazine, Entresto and Demadex. ?12. H/o IPF/CAP: Continue MDI. Has completed antibiotic course.  ?-- Cough resolving with  tessalon perles/Mucinex ?13. Hallucinations: Has been ongoing and he reports  2 episodes yesterday. Improving ?--Telesitter for safety.  ?14. Nightmares/Chronic insomnia: Continue Melatonin 3 mg (has nightmares with higher doses) ?15. Hypokalemia: Continue klor 20 BID.  ?16.  Screening for vitamin D deficiency: vitamin D level is 101- no need for supplementation.  ?17. Suboptimal magnesium: discuss starting supplement at night with patient and wife ?18. Acute blood loss anemia: check iron and B12 tomorrow morning. ?19. Overweight BMI 26.17: provide dietary education ?20.  Foot drop BLE  pt benefitting from AFOs , pt states he has had this issue due to "mini strokes " in the past  ?Prior CVA Right central 7, Right  frontal encephalomalacia which would explain Left foot drop / hypertonicity LLE ?LOS: ?4 days ?A FACE TO FACE EVALUATION WAS PERFORMED ? ?Luanna Salk Tawan Corkern ?04/28/2021, 12:17 PM  ? ?  ?

## 2021-04-29 LAB — BASIC METABOLIC PANEL
Anion gap: 4 — ABNORMAL LOW (ref 5–15)
BUN: 7 mg/dL — ABNORMAL LOW (ref 8–23)
CO2: 32 mmol/L (ref 22–32)
Calcium: 8.7 mg/dL — ABNORMAL LOW (ref 8.9–10.3)
Chloride: 98 mmol/L (ref 98–111)
Creatinine, Ser: 0.93 mg/dL (ref 0.61–1.24)
GFR, Estimated: >60 mL/min (ref >60)
Glucose, Bld: 95 mg/dL (ref 70–99)
Potassium: 4.6 mmol/L (ref 3.5–5.1)
Sodium: 134 mmol/L — ABNORMAL LOW (ref 135–145)

## 2021-04-29 MED ORDER — SORBITOL 70 % SOLN
30.0000 mL | Freq: Once | Status: AC
Start: 2021-04-29 — End: 2021-04-29
  Administered 2021-04-29: 30 mL via ORAL
  Filled 2021-04-29: qty 30

## 2021-04-29 NOTE — Progress Notes (Signed)
?                                                       PROGRESS NOTE ? ? ?Subjective/Complaints: ? ? ?Pt reports scared about hypokalemia- not eating great- usually eats a lot of bananas.  ?Denies pain- peeing OK.  ?LBM 4 days ago- not eating, but feels like needs to go.  ?Better overall.  ? ?ROS:  ?Pt denies SOB, abd pain, CP, N/V/C/D, and vision changes ? ? ?Objective: ?  ?No results found. ?No results for input(s): WBC, HGB, HCT, PLT in the last 72 hours. ? ?Recent Labs  ?  04/28/21 ?[id number] 04/29/21 ?0719  ?NA 131* 134*  ?K 4.4 4.6  ?CL 99 98  ?CO2 26 32  ?GLUCOSE 102* 95  ?BUN 10 7*  ?CREATININE 0.68 0.93  ?CALCIUM 8.0* 8.7*  ? ? ?Intake/Output Summary (Last 24 hours) at 04/29/2021 1915 ?Last data filed at 04/29/2021 1814 ?[place] per 24 hour  ?Intake 780 ml  ?Output --  ?Net 780 ml  ?  ? ?  ? ?Physical Exam: ?Vital Signs ?Blood pressure (!) 100/58, pulse 68, temperature 98 ?F (36.7 ?C), temperature source Oral, resp. rate 17, height 5\' 11"  (1.803 m), weight 85.1 kg, SpO2 96 %. ? ? ? ?General: awake, alert, appropriate, sitting up in bed; refused ot eat breakfast;  NAD ?HENT: conjugate gaze; oropharynx moist ?CV: regular rate; no JVD ?Pulmonary: CTA B/L; no W/R/R- good air movement ?GI: soft, NT, ND, (+)BS ?Psychiatric: appropriate- interactive ?Neurological: alert- more interactive ?Extremities: No clubbing, cyanosis, or edema ?Skin: No evidence of breakdown, no evidence of rash ?Neurologic: Cranial nerves II through XII intact, motor strength is 5/5 in bilateral deltoid, bicep, tricep, grip, hip flexor, knee extensors, ankle dorsiflexor and plantar flexor ?Sensory exam normal sensation to light touch and proprioception in bilateral upper and lower extremities ?Cerebellar exam normal finger to nose to finger as well as heel to shin in bilateral upper and lower extremities ?Musculoskeletal: Full range of motion in all 4 extremities. No joint swelling ? ?Neurological:  ?   Mental Status: He is alert and oriented to  person, place, and time.  ?   Comments: Left facial weakness with dysarthric speech. Alert. Memory intact. Tangential, impaired attention. He was able to follow simple motor commands.  4/5 strength throughout ? ?Assessment/Plan: ?1. Functional deficits which require 3+ hours per day of interdisciplinary therapy in a comprehensive inpatient rehab setting. ?Physiatrist is providing close team supervision and 24 hour management of active medical problems listed below. ?Physiatrist and rehab team continue to assess barriers to discharge/monitor patient progress toward functional and medical goals ? ?Care Tool: ? ?Bathing ?   ?   ?   ?  ?  ?Bathing assist Assist Level: Minimal Assistance - Patient > 75% ?  ?  ?Upper Body Dressing/Undressing ?Upper body dressing   ?  ?   ?Upper body assist Assist Level: Supervision/Verbal cueing ?   ?Lower Body Dressing/Undressing ?Lower body dressing ? ? ?   ?  ? ?  ? ?Lower body assist Assist for lower body dressing: Minimal Assistance - Patient > 75% ?   ? ?Toileting ?Toileting    ?Toileting assist Assist for toileting: Independent with assistive device ?Assistive Device Comment: urinal ?  ?Transfers ?Chair/bed transfer ? ?Transfers assist ?   ? ?  Chair/bed transfer assist level: Contact Guard/Touching assist ?Chair/bed transfer assistive device: Walker ?  ?Locomotion ?Ambulation ? ? ?Ambulation assist ? ?   ? ?Assist level: Contact Guard/Touching assist ?Assistive device: Walker-rolling ?Max distance: 166ft  ? ?Walk 10 feet activity ? ? ?Assist ?   ? ?Assist level: Minimal Assistance - Patient > 75% ?Assistive device: Walker-rolling  ? ?Walk 50 feet activity ? ? ?Assist   ? ?Assist level: Minimal Assistance - Patient > 75% ?Assistive device: Walker-rolling  ? ? ?Walk 150 feet activity ? ? ?Assist   ? ?Assist level: Minimal Assistance - Patient > 75% ?Assistive device: Walker-rolling ?  ? ?Walk 10 feet on uneven surface  ?activity ? ? ?Assist Walk 10 feet on uneven surfaces activity did  not occur: Safety/medical concerns ? ? ?  ?   ? ?Wheelchair ? ? ? ? ?Assist Is the patient using a wheelchair?: No ?  ?  ? ?  ?   ? ? ?Wheelchair 50 feet with 2 turns activity ? ? ? ?Assist ? ?  ?  ? ? ?   ? ?Wheelchair 150 feet activity  ? ? ? ?Assist ?   ? ? ?   ? ?Blood pressure (!) 100/58, pulse 68, temperature 98 ?F (36.7 ?C), temperature source Oral, resp. rate 17, height 5\' 11"  (1.803 m), weight 85.1 kg, SpO2 96 %. ? ? ? Medical Problem List and Plan: ?1. 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            -patient may shower ?            -ELOS/Goals: 6-8 days S ?           Continue CIR- PT, OT and SLP ?2.  Antithrombotics: ?-DVT/anticoagulation:  Mechanical: Sequential compression devices, below knee Bilateral lower extremities ?            -antiplatelet therapy: N/a ?3. Pain: continue tylenol prn.  ?4. Mood: LCSW to follow for evaluation and support.  ?            -antipsychotic agents: N/A ?5. Neuropsych: This patient may be intermittently capable of making decisions on his own behalf. ?6. Skin/Wound Care: Routine pressure relief measures.  ?7. Fluids/Electrolytes/Nutrition: Strict I/O.  ?8. Progressive hyponatremia: improving. Decrease fluid restriction to 1838mL.  Continue salt tabs tid.  ? 4/3- Na up to 134- con't to monitor and can start to wean salt tabs if get to normal range on Thursday when recheck.  ? 9. CAF: Monitor HR TID--restart Xarelto as per Dr. Ronnald Ramp- no signs of neuro decline 3/31 ?            --follow up with Dr. Terri Skains after discharge.  ?10. Melena/H/o GIB/AVM: Had black stools yesterday--usually has BM every 5 days. Resume colace.  ?--H/H today stable. Recheck CBC in am.  ?--monitor for hematochezia/drop in H/H ?11. Chronic diastolic CHF: Monitor weights daily and for signs of overload. ?            --continue Crestor, Hydralazine, Entresto and Demadex. ?12. H/o IPF/CAP: Continue MDI. Has completed antibiotic course.  ?-- Cough resolving with  tessalon  perles/Mucinex ?13. Hallucinations: Has been ongoing and he reports  2 episodes yesterday. Improving ?--Telesitter for safety.  ?14. Nightmares/Chronic insomnia: Continue Melatonin 3 mg (has nightmares with higher doses) ?15. Hypokalemia: Continue klor 20 BID.  ? 4/3- K+ 4.6- con't regimen ?16. Screening for vitamin D deficiency: vitamin D level is 101- no need  for supplementation.  ?17. Suboptimal magnesium: discuss starting supplement at night with patient and wife ?18. Acute blood loss anemia: check iron and B12 tomorrow morning. ?19. Overweight BMI 26.17: provide dietary education ?20.  Foot drop BLE  pt benefitting from AFOs , pt states he has had this issue due to "mini strokes " in the past  ?Prior CVA Right central 7, Right frontal encephalomalacia which would explain Left foot drop / hypertonicity LLE ?21. Constipation ? 4/3- wants ot have BM- has been 4 days- gave sorbitol after therapy today.  ? ? ?I spent a total of 38   minutes on total care today- >50% coordination of care- due to d/w nursing about bowels- and hyponatremia ? ? ?LOS: ?5 days ?A FACE TO FACE EVALUATION WAS PERFORMED ? ?Analeigha Nauman ?04/29/2021, 7:15 PM  ? ?  ?

## 2021-04-29 NOTE — Progress Notes (Signed)
Occupational Therapy TBI Note ? ?Patient Details  ?Name: Calvin Brown ?MRN: [id number] ?Date of Birth: August 26, 1933 ? ?Today's Date: 04/29/2021 ?OT Individual Time: 5732-2025 ?OT Individual Time Calculation (min): 42 min  ? ? ?Short Term Goals: ?Week 1:  OT Short Term Goal 1 (Week 1): STGs=LTGs due to ELOS ? ?Skilled Therapeutic Interventions/Progress Updates:  ?  Pt received sitting in the recliner with no c/o pain. He stood with the quad cane with CGA. Ambulatory transfer to the bathroom with CGA-min A. He completed all toileting tasks with CGA overall. He completed another 150 ft to the therapy gym with CGA, knees becoming more flexed as he fatigued. Pt verbose throughout session and requiring redirection to task. From the EOM he completed no UE sit <> stands with min-mod A. He added in an overhead reach to challenge functional activity tolerance and full body strengthening. He completed 3 trials with increased cueing required for hip/pelvis tuck to increase upright posture. He returned to his room, 150 ft of functional mobility with the quad cane and CGA. He was left sitting up with all needs met, chair alarm set.  ? ?Therapy Documentation ?Precautions:  ?Precautions ?Precautions: Fall ?Precaution Comments: Bilateral foot drop (chronic, does not wear AFOs). Story teller, tangential speech ?Restrictions ?Weight Bearing Restrictions: No ?  ?Agitated Behavior Scale: ?TBI ?Observation Details ?Observation Environment: CIR ?Start of observation period - Date: 04/29/21 ?Start of observation period - Time: 1425 ?End of observation period - Date: 04/29/21 ?End of observation period - Time: 1507 ?Agitated Behavior Scale (DO NOT LEAVE BLANKS) ?Short attention span, easy distractibility, inability to concentrate: Present to a moderate degree ?Impulsive, impatient, low tolerance for pain or frustration: Absent ?Uncooperative, resistant to care, demanding: Absent ?Violent and/or threatening violence toward people or property:  Absent ?Explosive and/or unpredictable anger: Absent ?Rocking, rubbing, moaning, or other self-stimulating behavior: Absent ?Pulling at tubes, restraints, etc.: Absent ?Wandering from treatment areas: Absent ?Restlessness, pacing, excessive movement: Absent ?Repetitive behaviors, motor, and/or verbal: Present to a moderate degree ?Rapid, loud, or excessive talking: Absent ?Sudden changes of mood: Absent ?Easily initiated or excessive crying and/or laughter: Absent ?Self-abusiveness, physical and/or verbal: Absent ?Agitated behavior scale total score: 18 ? ? ? ?Therapy/Group: Individual Therapy ? ?Curtis Sites ?04/29/2021, 3:14 PM ?

## 2021-04-29 NOTE — Progress Notes (Addendum)
Physical Therapy TBI Note ? ?Patient Details  ?Name: Calvin Brown ?MRN: [id number] ?Date of Birth: 20-Apr-1933 ? ?Today's Date: 04/29/2021 ?PT Individual Time: 6384-6659 ?PT Individual Time Calculation (min): 30 min  ? ?Short Term Goals: ?Week 1:  PT Short Term Goal 1 (Week 1): STG = LTG due to ELOS ? ?Skilled Therapeutic Interventions/Progress Updates:  ?   ? ?Pt sitting in recliner to start session - agreeable to PT tx and denies pain. Pt eating a frozen ice cream and requesting to finish it prior to leaving his room. During this, we discussed DC planning, B AFO's (which he is more agreeable to having at DC, will schedule AFO consult). Pt also quite tangential to start, describing this weekend's and this morning's therapy sessions to detail. Required gentle redirection to eventually mobilize out of room. Per previous notes, pt and family interested in getting back to using QC rather than RW. Trialed QC during session to assess safety and balance. Sit<>stand to QC with light minA for steadying during transition. He ambulated ~1106ft with minA and QC to the day room rheab gym - pt able to recall prior education on cane sequencing which he struggled with while dual-tasking. Additional gait training with light minA and QC when instructed to focus attention to task but continued to be unbalanced, especially with turns or navigating tight spaces. Continue to recommend RW at this time due to instability. Pt concluded session seated in recliner, safety belt alarm on, all needs in reach.  ? ?Therapy Documentation ?Precautions:  ?Precautions ?Precautions: Fall ?Precaution Comments: Bilateral foot drop (chronic, does not wear AFOs). Story teller, tangential speech ?Restrictions ?Weight Bearing Restrictions: No ?General: ?  ? ?Agitated Behavior Scale: ?TBI ?Observation Details ?Observation Environment: CIR ?Start of observation period - Date: 04/29/21 ?Start of observation period - Time: 1045 ?End of observation period - Date:  04/29/21 ?End of observation period - Time: 1115 ?Agitated Behavior Scale (DO NOT LEAVE BLANKS) ?Short attention span, easy distractibility, inability to concentrate: Present to a moderate degree ?Impulsive, impatient, low tolerance for pain or frustration: Present to a slight degree ?Uncooperative, resistant to care, demanding: Absent ?Violent and/or threatening violence toward people or property: Absent ?Explosive and/or unpredictable anger: Absent ?Rocking, rubbing, moaning, or other self-stimulating behavior: Absent ?Pulling at tubes, restraints, etc.: Absent ?Wandering from treatment areas: Absent ?Restlessness, pacing, excessive movement: Absent ?Repetitive behaviors, motor, and/or verbal: Absent ?Rapid, loud, or excessive talking: Present to an extreme degree ?Sudden changes of mood: Absent ?Easily initiated or excessive crying and/or laughter: Absent ?Self-abusiveness, physical and/or verbal: Absent ?Agitated behavior scale total score: 20 ? ? ? ?Therapy/Group: Individual Therapy ? ?Asako Saliba P Haden Cavenaugh ?04/29/2021, 10:56 AM  ?

## 2021-04-29 NOTE — Progress Notes (Signed)
Physical Therapy TBI Note ? ?Patient Details  ?Name: Calvin Brown ?MRN: [id number] ?Date of Birth: September 20, 1933 ? ?Today's Date: 04/29/2021 ?PT Individual Time: 3300-7622 ?PT Individual Time Calculation (min): 55 min  ? ?Short Term Goals: ?Week 1:  PT Short Term Goal 1 (Week 1): STG = LTG due to ELOS ? ?Skilled Therapeutic Interventions/Progress Updates:  ?  Patient received supine in bed, agreeable to PT. He denies pain. PT applying compression socks and donning sneakers TotalA while patient in bed, per patient request (as opposed to sitting EOB). He was able to come sit edge of bed with supervision and use of bedrail. CGA with RW stand pivot to wc. Patient requesting to brush his teeth and shave at the sink. Patient requiring encouragement to attempt task himself before asking for assistance, such as reaching his toiletry bag at the back of the sink. Patient requiring ~45 mins to brush his teeth and shave with Max multimodal cuing in attempts to redirect patient to task. He remains exceptionally hyperverbal with noted awareness of this, but inability to dual task-focusing on motor task. Patient making sexually inappropriate comments and poor ability to redirect to task. Ultimately brushing his teeth and shaving with set up A. He was able to ambulate 340ft with RW, B AFOs and CGA. General instability noted with slight deviation from straight walking path. Patient completing attention task on BITS and able to do so for 2 mins with Mod cues to attend to single task. He ambulated back to his room with RW and CGA. Remaining up in recliner, seatbelt alarm on, call light within reach.  ? ?Therapy Documentation ?Precautions:  ?Precautions ?Precautions: Fall ?Precaution Comments: Bilateral foot drop (chronic, does not wear AFOs). Story teller, tangential speech ?Restrictions ?Weight Bearing Restrictions: No ? ?Agitated Behavior Scale: ?TBI ?Observation Details ?Observation Environment: CIR ?Start of observation period - Date:  04/29/21 ?Start of observation period - Time: 65 ?End of observation period - Date: 04/29/21 ?End of observation period - Time: 0940 ?Agitated Behavior Scale (DO NOT LEAVE BLANKS) ?Short attention span, easy distractibility, inability to concentrate: Present to a moderate degree ?Impulsive, impatient, low tolerance for pain or frustration: Present to a slight degree ?Uncooperative, resistant to care, demanding: Absent ?Violent and/or threatening violence toward people or property: Absent ?Explosive and/or unpredictable anger: Absent ?Rocking, rubbing, moaning, or other self-stimulating behavior: Absent ?Pulling at tubes, restraints, etc.: Absent ?Wandering from treatment areas: Absent ?Restlessness, pacing, excessive movement: Absent ?Repetitive behaviors, motor, and/or verbal: Absent ?Rapid, loud, or excessive talking: Present to an extreme degree ?Sudden changes of mood: Absent ?Easily initiated or excessive crying and/or laughter: Absent ?Self-abusiveness, physical and/or verbal: Absent ?Agitated behavior scale total score: 20 ? ? ? ? ? ?Therapy/Group: Individual Therapy ? ?Westley Foots ?Westley Foots, PT, DPT, CBIS ? ?04/29/2021, 9:45 AM  ?

## 2021-04-30 ENCOUNTER — Inpatient Hospital Stay (HOSPITAL_COMMUNITY): Payer: Medicare Other

## 2021-04-30 DIAGNOSIS — S069X0S Unspecified intracranial injury without loss of consciousness, sequela: Secondary | ICD-10-CM

## 2021-04-30 NOTE — Progress Notes (Signed)
?                                                       PROGRESS NOTE ? ? ?Subjective/Complaints: ? ?K+ normal , Na+ borderline low , does not like Na+ tabs  ? ?ROS:  ?Pt denies SOB, abd pain, CP, N/V/C/D, and vision changes ? ? ?Objective: ?  ?No results found. ?No results for input(s): WBC, HGB, HCT, PLT in the last 72 hours. ? ?Recent Labs  ?  04/28/21 ?[id number] 04/29/21 ?0719  ?NA 131* 134*  ?K 4.4 4.6  ?CL 99 98  ?CO2 26 32  ?GLUCOSE 102* 95  ?BUN 10 7*  ?CREATININE 0.68 0.93  ?CALCIUM 8.0* 8.7*  ? ? ? ?Intake/Output Summary (Last 24 hours) at 04/30/2021 0903 ?Last data filed at 04/29/2021 1814 ?Yaeger per 24 hour  ?Intake 540 ml  ?Output --  ?Net 540 ml  ? ?  ? ?  ? ?Physical Exam: ?Vital Signs ?Blood pressure 130/65, pulse 76, temperature 98.1 ?F (36.7 ?C), temperature source Oral, resp. rate 18, height 5\' 11"  (1.803 m), weight 85.1 kg, SpO2 93 %. ? ? ?General: No acute distress ?Mood and affect are appropriate ?Heart: Regular rate and rhythm no rubs murmurs or extra sounds ?Lungs: Clear to auscultation, breathing unlabored, no rales or wheezes ?Abdomen: Positive bowel sounds, soft nontender to palpation, nondistended ?Extremities: No clubbing, cyanosis, or edema ? ?Skin: No evidence of breakdown, no evidence of rash ?Neurologic: Cranial nerves II through XII intact, motor strength is 5/5 in bilateral deltoid, bicep, tricep, grip, hip flexor, knee extensors, ankle dorsiflexor and plantar flexor ?Sensory exam normal sensation to light touch and proprioception in bilateral upper and lower extremities ?Cerebellar exam normal finger to nose to finger as well as heel to shin in bilateral upper and lower extremities ?Musculoskeletal: Full range of motion in all 4 extremities. No joint swelling ? ?Neurological:  ?   Mental Status: He is alert and oriented to person, place, and time.  ?   Comments: Left facial weakness with dysarthric speech. Alert. Memory intact. Tangential, impaired attention. He was able to follow simple  motor commands.  4/5 strength throughout ? ?Assessment/Plan: ?1. Functional deficits which require 3+ hours per day of interdisciplinary therapy in a comprehensive inpatient rehab setting. ?Physiatrist is providing close team supervision and 24 hour management of active medical problems listed below. ?Physiatrist and rehab team continue to assess barriers to discharge/monitor patient progress toward functional and medical goals ? ?Care Tool: ? ?Bathing ?   ?   ?   ?  ?  ?Bathing assist Assist Level: Minimal Assistance - Patient > 75% ?  ?  ?Upper Body Dressing/Undressing ?Upper body dressing   ?  ?   ?Upper body assist Assist Level: Supervision/Verbal cueing ?   ?Lower Body Dressing/Undressing ?Lower body dressing ? ? ?   ?  ? ?  ? ?Lower body assist Assist for lower body dressing: Minimal Assistance - Patient > 75% ?   ? ?Toileting ?Toileting    ?Toileting assist Assist for toileting: Independent with assistive device ?Assistive Device Comment: urinal ?  ?Transfers ?Chair/bed transfer ? ?Transfers assist ?   ? ?Chair/bed transfer assist level: Contact Guard/Touching assist ?Chair/bed transfer assistive device: Walker ?  ?Locomotion ?Ambulation ? ? ?Ambulation assist ? ?   ? ?Assist level:  Contact Guard/Touching assist ?Assistive device: Walker-rolling ?Max distance: 151ft  ? ?Walk 10 feet activity ? ? ?Assist ?   ? ?Assist level: Minimal Assistance - Patient > 75% ?Assistive device: Walker-rolling  ? ?Walk 50 feet activity ? ? ?Assist   ? ?Assist level: Minimal Assistance - Patient > 75% ?Assistive device: Walker-rolling  ? ? ?Walk 150 feet activity ? ? ?Assist   ? ?Assist level: Minimal Assistance - Patient > 75% ?Assistive device: Walker-rolling ?  ? ?Walk 10 feet on uneven surface  ?activity ? ? ?Assist Walk 10 feet on uneven surfaces activity did not occur: Safety/medical concerns ? ? ?  ?   ? ?Wheelchair ? ? ? ? ?Assist Is the patient using a wheelchair?: No ?  ?  ? ?  ?   ? ? ?Wheelchair 50 feet with 2 turns  activity ? ? ? ?Assist ? ?  ?  ? ? ?   ? ?Wheelchair 150 feet activity  ? ? ? ?Assist ?   ? ? ?   ? ?Blood pressure 130/65, pulse 76, temperature 98.1 ?F (36.7 ?C), temperature source Oral, resp. rate 18, height 5\' 11"  (1.803 m), weight 85.1 kg, SpO2 93 %. ? ? ? Medical Problem List and Plan: ?1. 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            -patient may shower ?            -ELOS/Goals: team conf in am  ?           Continue CIR- PT, OT and SLP ?2.  Antithrombotics: ?-DVT/anticoagulation:  Mechanical: Sequential compression devices, below knee Bilateral lower extremities ?            -antiplatelet therapy: N/a ?3. Pain: continue tylenol prn.  ?4. Mood: LCSW to follow for evaluation and support.  ?            -antipsychotic agents: N/A ?5. Neuropsych: This patient may be intermittently capable of making decisions on his own behalf. ?6. Skin/Wound Care: Routine pressure relief measures.  ?7. Fluids/Electrolytes/Nutrition: Strict I/O.  ?8. hyponatremia: improving. Decrease fluid restriction to 1862mL.  Improving , d/c Na Cl tabs  recheck.  ? 9. CAF: Monitor HR TID--restart Xarelto as per Dr. Ronnald Ramp- ?            --follow up with Dr. Terri Skains after discharge.  ?10. Melena/H/o GIB/AVM: Had black stools yesterday--usually has BM every 5 days. Resume colace.  ?--H/H today stable. Recheck CBC in am.  ?--monitor for hematochezia/drop in H/H ?11. Chronic diastolic CHF: Monitor weights daily and for signs of overload. ?            --continue Crestor, Hydralazine, Entresto and Demadex. ?12. H/o IPF/CAP: Continue MDI. Has completed antibiotic course.  ?-- Cough resolving with  tessalon perles/Mucinex ?13. Hallucinations: Has been ongoing and he reports  2 episodes yesterday. Improving ?--Telesitter for safety.  ?14. Nightmares/Chronic insomnia: Continue Melatonin 3 mg (has nightmares with higher doses) ?15. Hypokalemia: Continue klor 20 BID.  ? 4/3- K+ 4.6- con't regimen ?16. Screening for  vitamin D deficiency: vitamin D level is 101- no need for supplementation.  ?17. Suboptimal magnesium: discuss starting supplement at night with patient and wife ?18. Acute blood loss anemia: check iron and B12 tomorrow morning. ?19. Overweight BMI 26.17: provide dietary education ?20.  Foot drop BLE  pt benefitting from AFOs , pt states he has had this issue due to "mini strokes "  in the past  ?Prior CVA Right central 7, Right frontal encephalomalacia which would explain Left foot drop / hypertonicity LLE ?21. Constipation ? 4/4 type 4 medium BM  ? ? ? ?LOS: ?6 days ?A FACE TO FACE EVALUATION WAS PERFORMED ? ?Calvin Brown Emerald Shor ?04/30/2021, 9:03 AM  ? ?  ?

## 2021-04-30 NOTE — Progress Notes (Signed)
Physical Therapy TBI Note ? ?Patient Details  ?Name: Calvin Brown ?MRN: [id number] ?Date of Birth: [birth year] ? ?Today's Date: 04/30/2021 ?PT Individual Time: 1300-1415 ?PT Individual Time Calculation (min): 75 min  ? ?Short Term Goals: ?Week 1:  PT Short Term Goal 1 (Week 1): STG = LTG due to ELOS ? ?Skilled Therapeutic Interventions/Progress Updates:  ?   ?Pt supine in bed to start - agreeable to PT tx and denies pain. Pt with AFO's on and dressed for therapy. Supine<>sitting mod I with hospital bed features. Sit<>stand to RW with supervision and ambulates with CGA and RW to main rehab gym, ~132ft.  ? ?Reached out to [facility] from [facility] to obtain bilateral AFO's ([place] walk-on) and asked for PA order.  ? ?Pt instructed in BERG balance test with results outlined below.  ? ?Patient demonstrates increased fall risk as noted by score of 23/56 on Berg Balance Scale.  (<36= high risk for falls, close to 100%; 37-45 significant >80%; 46-51 moderate >50%; 52-55 lower >25%) ?*educated pt on how score impacts falls risk and the importance of using the ROLLING WALKER and not the QC due to high falls risk - pt voiced understanding but reported concern that his wife might not agree.  ? ?6 Min Walk Test:  ?Instructed patient to ambulate as quickly and as safely as possible for 6 minutes using LRAD. Patient was allowed to take standing rest breaks without stopping the test, but if the patient required a sitting rest break the clock would be stopped and the test would be over.  ?Results: 520 feet (158.5 meters, Avg speed 0.96m/s) using a RW with CGA. Results indicate that the patient has reduced endurance with ambulation compared to age matched norms.  ?Age Matched Norms 49-89 yo M: 417 meters ?MDC: 58.21 meters (190.98 feet) or 50 meters ?(ANPTA Core Set of Outcome Measures for Adults with Neurologic Conditions, 2018) ? ?During session, pt requesting to use bathroom due to urge to have BM. In ADL apartment, assisted pt to the bathroom  and pt was continent of bowel/bladder - he was able to wipe but required assist for thoroughness. Flowsheets updated to reflect BM. In ADL apartment, practiced furniture transfers from recliner to RW with CGA. ? ?Pt ambulated back to his room from main rehab gym, ~15ft, with RW and light minA due to fatigue from session. Concluded therapy session seated in recliner, safety belt alarm on, all needs in reach and call bell in lap. ? ?Therapy Documentation ?Precautions:  ?Precautions ?Precautions: Fall ?Precaution Comments: Bilateral foot drop (chronic, does not wear AFOs). Story teller, tangential speech ?Restrictions ?Weight Bearing Restrictions: No ?General: ?  ? ?Agitated Behavior Scale: ?TBI ?Observation Details ?Observation Environment: pt room ?Start of observation period - Date: 04/30/21 ?Start of observation period - Time: 0730 ?End of observation period - Date: 04/30/21 ?End of observation period - Time: 0815 ?Agitated Behavior Scale (DO NOT LEAVE BLANKS) ?Short attention span, easy distractibility, inability to concentrate: Present to a moderate degree ?Impulsive, impatient, low tolerance for pain or frustration: Absent ?Uncooperative, resistant to care, demanding: Absent ?Violent and/or threatening violence toward people or property: Absent ?Explosive and/or unpredictable anger: Absent ?Rocking, rubbing, moaning, or other self-stimulating behavior: Absent ?Pulling at tubes, restraints, etc.: Absent ?Wandering from treatment areas: Absent ?Restlessness, pacing, excessive movement: Absent ?Repetitive behaviors, motor, and/or verbal: Absent ?Rapid, loud, or excessive talking: Present to a slight degree ?Sudden changes of mood: Absent ?Easily initiated or excessive crying and/or laughter: Absent ?Self-abusiveness, physical and/or verbal: Absent ?Agitated behavior  scale total score: 17 ? ?Balance: ?Balance ?Balance Assessed: Yes ?Standardized Balance Assessment ?Standardized Balance Assessment: Sharlene Motts Balance  Test ?Sharlene Motts Balance Test ?Sit to Stand: Able to stand  independently using hands ?Standing Unsupported: Able to stand 30 seconds unsupported (Fall posteriorly at 1:45 mark) ?Sitting with Back Unsupported but Feet Supported on Floor or Stool: Able to sit safely and securely 2 minutes ?Stand to Sit: Controls descent by using hands ?Transfers: Able to transfer with verbal cueing and /or supervision ?Standing Unsupported with Eyes Closed: Able to stand 3 seconds ?Standing Ubsupported with Feet Together: Needs help to attain position but able to stand for 30 seconds with feet together ?From Standing, Reach Forward with Outstretched Arm: Reaches forward but needs supervision ?From Standing Position, Pick up Object from Floor: Able to pick up shoe, needs supervision ?From Standing Position, Turn to Look Behind Over each Shoulder: Needs supervision when turning ?Turn 360 Degrees: Needs close supervision or verbal cueing ?Standing Unsupported, Alternately Place Feet on Step/Stool: Needs assistance to keep from falling or unable to try ?Standing Unsupported, One Foot in Front: Loses balance while stepping or standing ?Standing on One Leg: Unable to try or needs assist to prevent fall ?Total Score: 23/56 ? ? ?Therapy/Group: Individual Therapy ? ?Calvin Brown ?04/30/2021, 2:16 PM  ?

## 2021-04-30 NOTE — Progress Notes (Signed)
Occupational Therapy TBI Note ? ?Patient Details  ?Name: Calvin Brown ?MRN: [id number] ?Date of Birth: 03-Mar-1933 ? ?Today's Date: 04/30/2021 ?OT Individual Time: [phone] ?OT Individual Time Calculation (min): 70 min  ? ? ?Short Term Goals: ?Week 1:  OT Short Term Goal 1 (Week 1): STGs=LTGs due to ELOS ? ?Skilled Therapeutic Interventions/Progress Updates:  ?  Pt received supine with no c/o pain, agreeable to OT session. Pt completed bed mobility to EOB with (S). He transferred to the w/c with the quad cane with CGA. He completed grooming tasks with (S) seated at the sink. He transferred into the shower with CGA with min cueing for sequencing/safety. He completed UB bathing with set up assist seated (requiring cueing for seated level bathing), and CGA for LB bathing in standing. He often times in the session asked for OT to "just do it for me" instead of attempting himself but with cueing/encouragement he did attempt. UB dressing with min A to pull down his shirt posteriorly. LB dressing with demonstration for reacher use to assist with reaching distally. He was able to return the demo and was very pleased with the results. Min A to don a depends and only (S) to don pants. He was able to stand several times with the sink anteriorly with supervision. He was verbose throughout session but redirectable to activity. Personal compression socks and B shoes with AFO's donned with total A. He completed 300 ft of functional mobility with the quad cane requiring min cueing for correct cane advancement. 2 LOB's requiring mod A to recover. He was left supine with all needs met, bed alarm set.  ? ?Therapy Documentation ?Precautions:  ?Precautions ?Precautions: Fall ?Precaution Comments: Bilateral foot drop (chronic, does not wear AFOs). Story teller, tangential speech ?Restrictions ?Weight Bearing Restrictions: No ?Agitated Behavior Scale: ?TBI ?Observation Details ?Observation Environment: pt room ?Start of observation period -  Date: 04/30/21 ?Start of observation period - Time: 74 ?End of observation period - Date: 04/30/21 ?End of observation period - Time: 1000 ?Agitated Behavior Scale (DO NOT LEAVE BLANKS) ?Short attention span, easy distractibility, inability to concentrate: Present to a moderate degree ?Impulsive, impatient, low tolerance for pain or frustration: Absent ?Uncooperative, resistant to care, demanding: Absent ?Violent and/or threatening violence toward people or property: Absent ?Explosive and/or unpredictable anger: Absent ?Rocking, rubbing, moaning, or other self-stimulating behavior: Absent ?Pulling at tubes, restraints, etc.: Absent ?Wandering from treatment areas: Absent ?Restlessness, pacing, excessive movement: Absent ?Repetitive behaviors, motor, and/or verbal: Present to a slight degree ?Rapid, loud, or excessive talking: Absent ?Sudden changes of mood: Absent ?Easily initiated or excessive crying and/or laughter: Absent ?Self-abusiveness, physical and/or verbal: Absent ?Agitated behavior scale total score: 17 ? ? ? ?Therapy/Group: Individual Therapy ? ?Curtis Sites ?04/30/2021, 9:05 AM ?

## 2021-04-30 NOTE — Progress Notes (Signed)
Speech Language Pathology TBI Note ? ?Patient Details  ?Name: Calvin Brown ?MRN: [id number] ?Date of Birth: Jun 03, 1933 ? ?Today's Date: 04/30/2021 ?SLP Individual Time: 5093-2671 ?SLP Individual Time Calculation (min): 45 min ? ?Short Term Goals: ?Week 1: SLP Short Term Goal 1 (Week 1): STG=LTG due to ELOS ? ?Skilled Therapeutic Interventions: ?Pt seen for skilled ST intervention targeting cognitive-linguistic goals. Pt alert/awake and sitting upright in bed with meal tray completed. Agreeable to ST intervention at bedside. Of note, pt verbose throughout and required frequent redirection. ? ?SLP targeted recall, error awareness, and problem-solving/reasoning via listing pros/cons re: use of pill organizer vs current system of medication management (taking medications out of pill bottle when needed), which pt participated in with Min verbal A. Overall, pt appears apathetic to use of pill organizer system upon discharge despite reviewing pros and cons of each system. Recalled ~75% of current medications with Mod I; only requiring extended processing time. Pt benefited from correction on two different occasions when communication breakdown occurred due to social language differences; repaired with Mod-Max verbal A from clinician. Pt reports that his wife took care of most iADL tasks prior to admission. Will re-assess basic problem-solving next session to further assess discharge from ST intervention.  ? ?Session concluded with pt in bed, bed alarm on, call bell within reach, and all immediate needs addressed. Continue per current ST POC. ? ?Pain ?Denies pain; NAD ? ?Agitated Behavior Scale: ?TBI ?Observation Details ?Observation Environment: pt room ?Start of observation period - Date: 04/30/21 ?Start of observation period - Time: 0730 ?End of observation period - Date: 04/30/21 ?End of observation period - Time: 0815 ?Agitated Behavior Scale (DO NOT LEAVE BLANKS) ?Short attention span, easy distractibility, inability to  concentrate: Present to a moderate degree ?Impulsive, impatient, low tolerance for pain or frustration: Absent ?Uncooperative, resistant to care, demanding: Absent ?Violent and/or threatening violence toward people or property: Absent ?Explosive and/or unpredictable anger: Absent ?Rocking, rubbing, moaning, or other self-stimulating behavior: Absent ?Pulling at tubes, restraints, etc.: Absent ?Wandering from treatment areas: Absent ?Restlessness, pacing, excessive movement: Absent ?Repetitive behaviors, motor, and/or verbal: Absent ?Rapid, loud, or excessive talking: Present to a slight degree ?Sudden changes of mood: Absent ?Easily initiated or excessive crying and/or laughter: Absent ?Self-abusiveness, physical and/or verbal: Absent ?Agitated behavior scale total score: 17 ? ?Therapy/Group: Individual Therapy ? ?Alyzae Hawkey A Burdette Gergely ?04/30/2021, 11:14 AM ?

## 2021-05-01 MED ORDER — ACETAMINOPHEN 325 MG PO TABS: 325.0000 mg | ORAL_TABLET | ORAL | Status: DC | PRN

## 2021-05-01 NOTE — Progress Notes (Signed)
?                                                       PROGRESS NOTE ? ? ?Subjective/Complaints: ? ?No issues overnite  ? ?ROS:  ?Pt denies SOB, abd pain, CP, N/V/C/D, and vision changes ? ? ?Objective: ?  ?CT HEAD WO CONTRAST (5MM) ? ?Result Date: 04/30/2021 ?CLINICAL DATA:  Subdural hematoma. EXAM: CT HEAD WITHOUT CONTRAST TECHNIQUE: Contiguous axial images were obtained from the base of the skull through the vertex without intravenous contrast. RADIATION DOSE REDUCTION: This exam was performed according to the departmental dose-optimization program which includes automated exposure control, adjustment of the mA and/or kV according to patient size and/or use of iterative reconstruction technique. COMPARISON:  04/22/2021 FINDINGS: Brain: No focal abnormality seen affecting the brainstem or cerebellum. 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 No evidence of acute or subacute infarction. There is ex vacuo enlargement of the frontal horn of the right lateral ventricle. Low-density subdural hematoma on the left is very minimally larger, maximal thickness 8 mm, with a very small amount of hyperdense blood along the left frontal convexity not seen on the previous exam. Vascular: There is atherosclerotic calcification of the major vessels at the base of the brain. Skull: Negative Sinuses/Orbits: Clear/normal Other: Right parietal scalp hematoma slightly smaller. IMPRESSION: Right parietal scalp hematoma is slightly smaller. Left-sided low-density subdural hematoma is a mm increased in thickness, now measuring 8 mm. There is a very tiny amount of hyperdense blood in the left frontal portion of the subdural collection which was not seen previously. No evidence of large amount of recent bleeding. Minimal mass effect. Old right frontal infarction is unchanged. Electronically Signed   By: Nelson Chimes M.D.   On: 04/30/2021 11:18   ?No results for input(s): WBC, HGB,  HCT, PLT in the last 72 hours. ? ?Recent Labs  ?  04/29/21 ?0719  ?NA 134*  ?K 4.6  ?CL 98  ?CO2 32  ?GLUCOSE 95  ?BUN 7*  ?CREATININE 0.93  ?CALCIUM 8.7*  ? ? ? ?Intake/Output Summary (Last 24 hours) at 05/01/2021 0849 ?Last data filed at 04/30/2021 1851 ?[place] per 24 hour  ?Intake 600 ml  ?Output --  ?Net 600 ml  ? ?  ? ?  ? ?Physical Exam: ?Vital Signs ?Blood pressure (!) 108/56, pulse 83, temperature 98 ?F (36.7 ?C), resp. rate 16, height $RemoveBe'5\' 11"'cLGRWpoVr$  (1.803 m), weight 85.1 kg, SpO2 93 %. ? ? ?General: No acute distress ?Mood and affect are appropriate ?Heart: Regular rate and rhythm no rubs murmurs or extra sounds ?Lungs: Clear to auscultation, breathing unlabored, no rales or wheezes ?Abdomen: Positive bowel sounds, soft nontender to palpation, nondistended ?Extremities: No clubbing, cyanosis, or edema ? ?Skin: No evidence of breakdown, no evidence of rash ?Neurologic: Cranial nerves II through XII intact, motor strength is 5/5 in bilateral deltoid, bicep, tricep, grip, hip flexor, knee extensors, ankle dorsiflexor and plantar flexor ?Sensory exam normal sensation to light touch and proprioception in bilateral upper and lower extremities ?Cerebellar exam normal finger to nose to finger as well as heel to shin in bilateral upper and lower extremities ?Musculoskeletal: Full range of motion in all 4 extremities. No joint swelling ? ?Neurological:  ?   Mental Status: He is alert and oriented to  person, place, and time.  ?   Comments: Left facial weakness with dysarthric speech. Alert. Memory intact. Tangential, impaired attention. He was able to follow simple motor commands.  4/5 strength throughout ? ?Assessment/Plan: ?1. Functional deficits which require 3+ hours per day of interdisciplinary therapy in a comprehensive inpatient rehab setting. ?Physiatrist is providing close team supervision and 24 hour management of active medical problems listed below. ?Physiatrist and rehab team continue to assess barriers to  discharge/monitor patient progress toward functional and medical goals ? ?Care Tool: ? ?Bathing ?   ?Body parts bathed by patient: Right arm, Left arm, Right lower leg, Left lower leg, Face, Chest, Front perineal area, Abdomen, Buttocks, Right upper leg, Left upper leg  ?   ?  ?  ?Bathing assist Assist Level: Contact Guard/Touching assist ?  ?  ?Upper Body Dressing/Undressing ?Upper body dressing   ?What is the patient wearing?: Pull over shirt ?   ?Upper body assist Assist Level: Minimal Assistance - Patient > 75% ?   ?Lower Body Dressing/Undressing ?Lower body dressing ? ? ?   ?What is the patient wearing?: Underwear/pull up, Pants ? ?  ? ?Lower body assist Assist for lower body dressing: Minimal Assistance - Patient > 75% ?   ? ?Toileting ?Toileting    ?Toileting assist Assist for toileting: Independent with assistive device ?Assistive Device Comment: urinal ?  ?Transfers ?Chair/bed transfer ? ?Transfers assist ?   ? ?Chair/bed transfer assist level: Contact Guard/Touching assist ?Chair/bed transfer assistive device: Walker ?  ?Locomotion ?Ambulation ? ? ?Ambulation assist ? ?   ? ?Assist level: Contact Guard/Touching assist ?Assistive device: Walker-rolling ?Max distance: 133ft  ? ?Walk 10 feet activity ? ? ?Assist ?   ? ?Assist level: Minimal Assistance - Patient > 75% ?Assistive device: Walker-rolling  ? ?Walk 50 feet activity ? ? ?Assist   ? ?Assist level: Minimal Assistance - Patient > 75% ?Assistive device: Walker-rolling  ? ? ?Walk 150 feet activity ? ? ?Assist   ? ?Assist level: Minimal Assistance - Patient > 75% ?Assistive device: Walker-rolling ?  ? ?Walk 10 feet on uneven surface  ?activity ? ? ?Assist Walk 10 feet on uneven surfaces activity did not occur: Safety/medical concerns ? ? ?  ?   ? ?Wheelchair ? ? ? ? ?Assist Is the patient using a wheelchair?: No ?  ?  ? ?  ?   ? ? ?Wheelchair 50 feet with 2 turns activity ? ? ? ?Assist ? ?  ?  ? ? ?   ? ?Wheelchair 150 feet activity  ? ? ? ?Assist ?    ? ? ?   ? ?Blood pressure (!) 108/56, pulse 83, temperature 98 ?F (36.7 ?C), resp. rate 16, height $RemoveBe'5\' 11"'ZGszSjafe$  (1.803 m), weight 85.1 kg, SpO2 93 %. ? ? ? Medical Problem List and Plan: ?1. 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            -patient may shower ?            -ELOS/Goals: Team conference today please see physician documentation under team conference tab, met with team  to discuss problems,progress, and goals. Formulized individual treatment plan based on medical history, underlying problem and comorbidities. ? ?           Continue CIR- PT, OT and SLP ?2.  Antithrombotics: ?-DVT/anticoagulation:  Mechanical: Sequential compression devices, below knee Bilateral lower extremities ?            -  antiplatelet therapy: N/a ?3. Pain: continue tylenol prn.  ?4. Mood: LCSW to follow for evaluation and support.  ?            -antipsychotic agents: N/A ?5. Neuropsych: This patient may be intermittently capable of making decisions on his own behalf. ?6. Skin/Wound Care: Routine pressure relief measures.  ?7. Fluids/Electrolytes/Nutrition: Strict I/O.  ?8. hyponatremia: improving. Decrease fluid restriction to 1825mL.  Improving , d/c Na Cl tabs  recheck.  ? 9. CAF: Monitor HR TID--restart Xarelto as per Dr. Ronnald Ramp- ?            --follow up with Dr. Terri Skains after discharge.  ?10. Melena/H/o GIB/AVM: Had black stools yesterday--usually has BM every 5 days. Resume colace.  ?--H/H today stable. Recheck CBC in am.  ?--monitor for hematochezia/drop in H/H ?11. Chronic diastolic CHF: Monitor weights daily and for signs of overload. ?            --continue Crestor, Hydralazine, Entresto and Demadex. ?12. H/o IPF/CAP: Continue MDI. Has completed antibiotic course.  ?-- Cough resolving with  tessalon perles/Mucinex ?13. Hallucinations: Has been ongoing and he reports  2 episodes yesterday. Improving ?--Telesitter for safety.  ?14. Nightmares/Chronic insomnia: Continue Melatonin 3 mg (has  nightmares with higher doses) ?15. Hypokalemia: Continue klor 20 BID.  ? 4/3- K+ 4.6- con't regimen ?16. Screening for vitamin D deficiency: vitamin D level is 101- no need for supplementation.  ?17. Suboptimal magnesium: di

## 2021-05-01 NOTE — Discharge Summary (Signed)
Physician Discharge Summary  ?Patient ID: ?Calvin Brown ?MRN: [id number] ?DOB/AGE: 1933/10/14 88 y.o. ? ?Admit date: 04/24/2021 ?Discharge date: 05/03/2021 ? ?Discharge Diagnoses:  ?Principal Problem: ?  TBI (traumatic brain injury) ([place]) ?Active Problems: ?  Atrial fibrillation ([place]) ?  Hypercoagulable state ([place]) ?  Hypertension ? ? ?Discharged Condition: stable ? ?Significant Diagnostic Studies: ? ?CT HEAD WO CONTRAST (5MM) ? ?Result Date: 04/30/2021 ?CLINICAL DATA:  Subdural hematoma. EXAM: CT HEAD WITHOUT CONTRAST TECHNIQUE: Contiguous axial images were obtained from the base of the skull through the vertex without intravenous contrast. RADIATION DOSE REDUCTION: This exam was performed according to the departmental dose-optimization program which includes automated exposure control, adjustment of the mA and/or kV according to patient size and/or use of iterative reconstruction technique. COMPARISON:  04/22/2021 FINDINGS: Brain: No focal abnormality seen affecting the brainstem or cerebellum. 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 No evidence of acute or subacute infarction. There is ex vacuo enlargement of the frontal horn of the right lateral ventricle. Low-density subdural hematoma on the left is very minimally larger, maximal thickness 8 mm, with a very small amount of hyperdense blood along the left frontal convexity not seen on the previous exam. Vascular: There is atherosclerotic calcification of the major vessels at the base of the brain. Skull: Negative Sinuses/Orbits: Clear/normal Other: Right parietal scalp hematoma slightly smaller. IMPRESSION: Right parietal scalp hematoma is slightly smaller. Left-sided low-density subdural hematoma is a mm increased in thickness, now measuring 8 mm. There is a very tiny amount of hyperdense blood in the left frontal portion of the subdural collection which was not seen previously. No evidence of large amount of  recent bleeding. Minimal mass effect. Old right frontal infarction is unchanged. Electronically Signed   By: Nelson Chimes M.D.   On: 04/30/2021 11:18  ? ? ?Labs:  ?Basic Metabolic Panel: ?Recent Labs  ?Lab 04/27/21 ?0532 04/28/21 ?[id number] 04/29/21 ?0719 05/02/21 ?0502 05/02/21 ?1526  ?NA 129* 131* 134* 136 135  ?K 4.4 4.4 4.6 4.2 4.8  ?CL 98 99 98 102 103  ?CO2 26 26 32 27 28  ?GLUCOSE 96 102* 95 102* 120*  ?BUN 14 10 7* 11 12  ?CREATININE 0.77 0.68 0.93 0.98 1.26*  ?CALCIUM 7.8* 8.0* 8.7* 8.5* 8.5*  ? ? ?CBC: ? ?  Latest Ref Rng & Units 05/02/2021  ?  3:26 PM 04/25/2021  ?  5:24 AM 04/24/2021  ? 12:59 AM  ?CBC  ?WBC 4.0 - 10.5 K/uL 11.2   11.8   13.0    ?Hemoglobin 13.0 - 17.0 g/dL 11.4   12.0   11.6    ?Hematocrit 39.0 - 52.0 % 35.1   34.1   32.9    ?Platelets 150 - 400 K/uL 231   242   261    ?  ? ?CBG: ?No results for input(s): GLUCAP in the last 168 hours. ? ?Brief HPI:   Calvin Brown is a 88 y.o. male with history of CAD, CVA with residual dysarthria and left foot drop, A-fib, IPF, GI bleed secondary to AVM and recent COVID-19 who was seen on EGD on 04/15/2021 after a fall with occipital hematoma but CT head was negative for bleed but found to have PNA and was discharged to home on doxycycline and Augmentin as he declined overnight stay.  He was readmitted 04/21/2021 with weakness and mental status changes as well as melena.   ? ?He was found to have left subacute epidural hematoma as  well  as heme positive stools. Dr. Hassel Neth recommended stopping Xarelto and felt no intervention was needed.  He was placed on full liquid diet with serial monitoring of H&H per input from Dr. Collene Mares.  He had progressive drop in sodium to 125 with hypokalemia and this was being monitored.  He continued to have balance deficits, bouts of hallucination, delayed in processing as well as poor safety awareness.  CIR was recommended due to functional decline. ? ? ?Hospital Course: Calvin Brown was admitted to rehab 04/24/2021 for inpatient  therapies to consist of PT, ST and OT at least three hours five days a week. Past admission physiatrist, therapy team and rehab RN have worked together to provide customized collaborative inpatient rehab.  He was placed on fluid restriction of 1500 cc a day and salt tabs added with serial check of electrolytes.  Follow up labs showed sodium levels to be improving and salt tabs were discontinued and FR relaxed to 1800 cc/day. Colace resumed to help manage constipation. Follow up CBC showed H/H to be relatively stable and leucocytosis is resolving.  ? ?His blood pressures were monitored on TID basis and continue to be low at times therefore hydralazine was decreased to 10 mg TID. Respiratory status has been stable and no signs of overload noted.  Telemetry sitter was ordered for safety and fall prevention.  Dr. Arnoldo Morale has followed up for input on [facility] and cleared patient to resume Xarelto on 03/30. Follow up CT head on 04/04 showed slight increase in SDH. Dr. Kathyrn Sheriff consulted and recommended MMA embolization.  Patient was making good gains in nearing end of his rehab stay.  He was discharged to acute hospital on 05/03/21  for surgery. ? ? ?Rehab course: During patient's stay in rehab weekly team conferences were held to monitor patient's progress, set goals and discuss barriers to discharge. At admission, patient required min assist with mobility and ADL tasks. He exhibited mild cognitive impairments with behaviors consistent with RLAS VIII. He  has had improvement in activity tolerance, balance, postural control as well as ability to compensate for deficits. He is able to complete ADL tasks with supervision. He requires supervision for transfers and to ambulate up to 520' with RW. He requires CGA to climb stairs. He is able to complete mildly complex tasks with supervision which wife reported was at baseline. Family education has been completed.  ? ? ? Current Medications: ? benzonatate  100 mg Oral BID  ? docusate  sodium  100 mg Oral QHS  ? ezetimibe  10 mg Oral Daily  ? famotidine  20 mg Oral BID  ? fluticasone furoate-vilanterol  1 puff Inhalation Daily  ? guaiFENesin  1,200 mg Oral BID  ? hydrALAZINE  10 mg Oral TID  ? isosorbide dinitrate  20 mg Oral TID  ? melatonin  3 mg Oral QHS  ? pantoprazole  40 mg Oral BID  ? potassium chloride  20 mEq Oral BID  ? rivaroxaban  15 mg Oral Q supper--hold and resume per Dr. Kathyrn Sheriff   ? rosuvastatin  20 mg Oral QHS  ? sacubitril-valsartan  1 tablet Oral BID  ? tamsulosin  0.8 mg Oral Daily  ? torsemide  5 mg Oral Daily  ?  ? ?Diet: Heart Healthy ? ?Disposition:  ?1. Resume Xarelto in 04/08 or per Dr. Kathyrn Sheriff.  ? ? Follow-up Information   ? ? Eustace Moore, MD. Schedule an appointment as soon as possible for a visit in 2 week(s).   ?  Specialty: Neurosurgery ?Contact information: ?[address] ?[address] ?[place] [address] ?[phone] ? ? ?  ?  ? ? Sueanne Margarita, DO Follow up.   ?Specialty: Internal Medicine ?Why: call for follow up appointment ?Contact information: ?[address] ?[place] [address] ?[phone] ? ? ?  ?  ? ? Kirsteins, Luanna Salk, MD Follow up.   ?Specialty: Physical Medicine and Rehabilitation ?Why: office will call you with follow up appointment ?Contact information: ?[address] ?[phone] ?[place] [address] ?[phone] ? ? ?  ?  ? ?  ?  ? ?  ? ? ?Signed: ?Ivan Anchors Jobany Montellano ?05/02/2021, 5:17 PM ?  ?

## 2021-05-01 NOTE — Progress Notes (Signed)
Occupational Therapy Session Note ? ?Patient Details  ?Name: Calvin Brown ?MRN: [id number] ?Date of Birth: 06-17-1933 ? ?Today's Date: 05/01/2021 ?OT Individual Time: 4287-6811 ?OT Individual Time Calculation (min): 30 min  ? ? ?Short Term Goals: ?Week 1:  OT Short Term Goal 1 (Week 1): STGs=LTGs due to ELOS ? ? ?Skilled Therapeutic Interventions/Progress Updates:  ?  Pt semi reclined, very verbose, and needing frequent simple cues to redirect to task at hand but still needing increased time to complete due to significant internal distraction. Pt perseverating on inappropriate subject matter also needing Vcs to increase awareness and redirect.  Pt completed supine to sit with supervision, sit to stand with supervision, ambulated to bathroom using RW with CGA, stood at toilet to urinate with supervision, ambulated to sink and brushed teeth in standing with close supervision, and ambulated a few step to EOB and stand to sit with close supervision.  Sit to supine with supervision.  Call bell in reach, telesitter and bed alarm on. ? ?Therapy Documentation ?Precautions:  ?Precautions ?Precautions: Fall ?Precaution Comments: Bilateral foot drop (chronic, does not wear AFOs). Story teller, tangential speech ?Restrictions ?Weight Bearing Restrictions: No ? ? ? ?Therapy/Group: Individual Therapy ? ?Dian Situ Anayla Giannetti ?05/01/2021, 12:43 PM ?

## 2021-05-01 NOTE — Patient Care Conference (Signed)
Inpatient RehabilitationTeam Conference and Plan of Care Update ?Date: 05/01/2021   Time: 11:24 AM  ? ? ?Patient Name: Calvin Brown      ?Medical Record Number: [id number]  ?Date of Birth: 1933/04/18 ?Sex: Male         ?Room/Bed: 4W09C/4W09C-01 ?Payor Info: Payor: MEDICARE / Plan: MEDICARE PART A AND B / Product Type: *No Product type* /   ? ?Admit Date/Time:  04/24/2021  3:33 PM ? ?Primary Diagnosis:  TBI (traumatic brain injury) ([facility]) ? ?Hospital Problems: Principal Problem: ?  TBI (traumatic brain injury) ([facility]) ?Active Problems: ?  Atrial fibrillation ([facility]) ?  Hypercoagulable state ([facility]) ?  Hypertension ? ? ? ?Expected Discharge Date: Expected Discharge Date: 05/03/21 (To OR 05/03/21) ? ?Team Members Present: ?Physician leading conference: Dr. Claudette Laws ?Social Worker Present: Dossie Der, LCSW ?Nurse Present: Chana Bode, RN ?PT Present: Wynelle Link, PT ?OT Present: Dolphus Jenny, OT ?SLP Present: Other (comment) Sarita Bottom, SLP) ?PPS Coordinator present : Fae Pippin, SLP ? ?   Current Status/Progress Goal Weekly Team Focus  ?Bowel/Bladder ? ?   Continent        ?Swallow/Nutrition/ Hydration ? ?           ?ADL's ? ? CGA functional mobility, min assist LB self care, supervision UB self care  supervision  functional transfer training, self care training, balance, FMC, pt education, dc planning   ?Mobility ? ? CGA sit<>stand transfers, CGA gait 129ft using RW, minA gait 155ft using QC. Have provided bilateral AFO's and will need these prior to DC ([place] Walk-on trimmable). Hyperverbose which could be baseline  Supervision  dynamic standing balance, gait training using RW and QC with AFOs, stair training, DC planning   ?Communication ? ?           ?Safety/Cognition/ Behavioral Observations ? Supervision to Mod I  Supervision  Likely at baseline; will plan to d/c from ST   ?Pain ? ?   N/A        ?Skin ? ?   N/A        ? ? ?Discharge Planning:  ?Home with wife and fmaily to provide 24/7 supervision.  Doing well in his therapies. Family is very invovled   ?Team Discussion: ?Neurology noted small leak/SDH situation with embolization schedudule for 05/03/21. Plan to discharge to acute for procedure, and then d/c home the next day. ? ?Patient on target to meet rehab goals: ?yes, currently CGA - supervision overall. Limited by poor awareness of deficits, but cognitively at baseline. ? ?*See Care Plan and progress notes for long and short-term goals.  ? ?Revisions to Treatment Plan:  ?SLP services signed off  ?Teaching Needs: ?Safety, medications, etc. ?  ?Current Barriers to Discharge: ?Decreased caregiver support ? ?Possible Resolutions to Barriers: ?HH follow up services ?DME: RW ?  ? ? Medical Summary ?  ?  ?  ?  ? ? ?  ? ? ?I attest that I was present, lead the team conference, and concur with the assessment and plan of the team. ? ? ?Chana Bode B ?05/01/2021, 3:43 PM  ? ? ? ? ? ? ?

## 2021-05-01 NOTE — Progress Notes (Signed)
Speech Language Pathology Discharge Summary ? ?Patient Details  ?Name: Calvin Brown ?MRN: [id number] ?Date of Birth: Jan 07, 1934 ? ?Today's Date: 05/01/2021 ?SLP Individual Time: 6314-9702 ?SLP Individual Time Calculation (min): 57 min ? ? ?Skilled Therapeutic Interventions:  Skilled ST services focused on education and cognitive skills. SLP facilitated basic problem solving, error awareness and sustained attention in ALFA money management, pt required supervision A verba cues fading to mod I. Pt supports wife drives and completes all IADLs, except medication, but agrees to allow spouse to assist at discharge. Pt supports cognitive baseline and pt's wife supported baseline on 04/27/21. Pt supports occasional hallucinations, however is quickly able to reorient self. Pt supports activating telesitter only when he has waited a long time to gain assistance to use the bathroom after pressing call bell at night. SLP provided education pertaining to safety awareness, assistance with higher level problem solving, error awareness and sustained/selective attention. All questions answered to satisfaction. SLP discharged from [facility] services due to baseline function. Pt was left in room with call bell within reach and bed alarm set.  ? ? ? ?Patient has met 3 of 3 long term goals.  Patient to discharge at overall Supervision level.  ?Reasons goals not met:    ? ?Clinical Impression/Discharge Summary:   Pt made good progress meeting 3 out 3 goals, discharging at supervision A. Pt and pt's wife support cognitive baseline and she is able to provide supervision at discharge. Pt completed mildly complex medication management task and basic money management tasks. Pt completed only mildly complex problem solving tasks at home, therefore problem solving goal set above baseline after communicating with pt's wife. Pt does continue to demonstrate verbosity, reduced selective attention and reduced error awareness, however pt and pt's wife support  baseline function. Education was completed with pt only due to wife supporting baseline function and all questions answered to satisfaction. ?Pt benefited from skilled ST services in order to maximize functional independence and reduce burden of care, likely requiring intermittent supervision on higher level tasks at discharge.  ? ?Care Partner:  ?Caregiver Able to Provide Assistance: Yes  ?Type of Caregiver Assistance: Physical;Cognitive ? ?Recommendation:  ?24 hour supervision/assistance;None  ?   ? ?Equipment: N/A  ? ?Reasons for discharge: Treatment goals met;Other (comment) (pt is at baseline cognition)  ? ?Patient/Family Agrees with Progress Made and Goals Achieved: Yes  ? ? ?[name] ?05/01/2021, 2:35 PM ? ?

## 2021-05-01 NOTE — Plan of Care (Signed)
?  Problem: RH Problem Solving ?Goal: LTG Patient will demonstrate problem solving for (SLP) ?Description: LTG:  Patient will demonstrate problem solving for basic/complex daily situations with cues  (SLP) ?Outcome: Adequate for Discharge ?Note: Pt was completing mostly basic-mildly complex problem solving at baseline ?  ?

## 2021-05-01 NOTE — Progress Notes (Signed)
?  NEUROSURGERY PROGRESS NOTE  ? ?No issues overnight. History reviewed with Dr. Yetta Barre and the patient.  ? ?EXAM:  ?BP 94/60 (BP Location: Left Arm)   Pulse 67   Temp 98.1 ?F (36.7 ?C)   Resp 17   Ht 5\' 11"  (1.803 m)   Wt 85.1 kg   SpO2 96%   BMI 26.17 kg/m?  ? ?Awake, alert, oriented  ?Speech fluent, appropriate  ?CN grossly intact  ?5/5 BUE/BLE  ? ?IMAGING: ?CT of the head dated April 30, 2021 was reviewed and compared to prior CT of March 27.  This again demonstrates a chronic left frontoparietal subdural hematoma.  There appears to be approximately 1 millimeter of increase in thickness.  There is also a small acute component not seen on prior CT scan.  There remains minimal local mass effect.  No hydrocephalus or midline shift. ? ?IMPRESSION:  ?88 y.o. male currently undergoing comprehensive inpatient rehab with   Known left frontoparietal chronic subdural hematoma which appears to be slightly enlarged with acute component on repeat CT scan.  Patient does require Xarelto for his atrial fibrillation.  I therefore think he is not a great candidate for operative evacuation however I do think that given the enlargement but  continued asymptomatic nature of the subdural hematoma that middle meningeal artery embolization is an option in  and attempt to minimize or forego time off anticoagulation while providing him an excellent chance to resorb the subdural without surgery. ? ?PLAN: ?-   We will plan on proceeding with endovascular left middle meningeal artery embolization this coming Friday ?-  I will hold the patient's Xarelto tomorrow and Friday with plan to resume on Saturday. ? ?  I have reviewed the situation with the patient and his wife over the phone.  I have reviewed with them the option for middle meningeal artery embolization.  We discussed the details of the procedure and the expected postoperative course and recovery.  We have also discussed the associated risks including risk of stroke, arterial  dissection, contrast nephropathy, and going hematoma.  We did also discuss the possible outcomes of the procedure including continued enlargement ultimately requiring surgery.  All his questions today were answered and the patient indicates he would like to proceed with the procedure.   I have also discussed the situation with Dr. Monday.  ? ? ?Wynn Banker, MD ?[facility]  ? ?[facility], MD ?[facility]  ? ?

## 2021-05-01 NOTE — Progress Notes (Signed)
I reviewed his head CT and the left chronic subdural hematoma is slightly larger.  There is a tiny acute component.  I have spoken with Dr. Kathyrn Sheriff as I believe the patient is a candidate for MMA embolization in the hopes of resolution of the subdural hematoma without direct surgical intervention.  There is evidence in our literature that Lipitor can improve micro vascularization and help resolve chronic subdural hematoma also. ?

## 2021-05-01 NOTE — Discharge Instructions (Addendum)
Inpatient Rehab Discharge Instructions ? ?[name] ?Discharge date and time:   ? ?Activities/Precautions/ Functional Status: ?Activity: no lifting, driving, or strenuous exercise till cleared by MD ?Diet: cardiac diet ?Wound Care: keep wound clean and dry ? ? ?Functional status:  ?___ No restrictions     ___ Walk up steps independently ?___ 24/7 supervision/assistance   ___ Walk up steps with assistance ?___ Intermittent supervision/assistance  ___ Bathe/dress independently ?___ Walk with walker     ___ Bathe/dress with assistance ?___ Walk Independently    ___ Shower independently ?___ Walk with assistance    ___ Shower with assistance ?___ No alcohol     ___ Return to work/school ________ ? ?Special Instructions: ? ?COMMUNITY REFERRALS UPON DISCHARGE:   ? ?Home Health:   PT & OT ?               Agency: [facility] Phone: [phone] ? ?Medical Equipment/Items Ordered:HAS ALL NEEDED EQUIPMENT ?                                                Agency/Supplier:NA ? ? ? ?My questions have been answered and I understand these instructions. I will adhere to these goals and the provided educational materials after my discharge from the hospital. ? ?Patient/Caregiver Signature _______________________________ Date __________ ? ?Clinician Signature _______________________________________ Date __________ ? ?Please bring this form and your medication list with you to all your follow-up doctor's appointments.   ?

## 2021-05-01 NOTE — Progress Notes (Signed)
Patient ID: Branndon Tuite, male   DOB: April 13, 1933, 88 y.o.   MRN: [id number]  Met with pt and daughter in his room to update both on team conference goals of supervision level and discharge on 4/7. Will discharge to surgery and from there discharge home. Will be up to neurosurgeon how long he is on acute. Discussed equipment recommendation of rolling walker, pt has his wife's in his room to use. Also follow up home health therapies and they will get back with worker regarding which agency they chose to use. Daughter had questions for the neurosurgeon gave her his name and umber to call to ask her questions. She will follow up on this. Pt pleased with the plan and reports he and his wife spoke with the neurosurgeon this am. Continue to follow until Friday ?

## 2021-05-01 NOTE — Progress Notes (Signed)
Orthopedic Tech Progress Note ?Patient Details:  ?Calvin Brown ?Feb 23, 1933 ?[id number] ? ?Called in order to [employer] for a BLE AFO CONSULTS  ? ?Patient ID: Calvin Brown, male   DOB: 08/28/1933, 88 y.o.   MRN: [id number] ? ?Calvin Brown ?05/01/2021, 1:32 PM ? ?

## 2021-05-01 NOTE — Progress Notes (Signed)
Physical Therapy TBI Note ? ?Patient Details  ?Name: Calvin Brown ?MRN: [id number] ?Date of Birth: 09-13-1933 ? ?Today's Date: 05/01/2021 ?PT Individual Time: 1300-1410 ?PT Individual Time Calculation (min): 70 min  ? ?Short Term Goals: ?Week 1:  PT Short Term Goal 1 (Week 1): STG = LTG due to ELOS ? ?Skilled Therapeutic Interventions/Progress Updates:  ?   ?Pt supine in bed to start with his daughter at the bedside. Pt agreeable to PT tx and reports mild (3/10) L knee pain that began today. LPN notified at end of session. Focused session on family education to prepare for upcoming DC - she was present throughout the session for active observation and questions. Daughter with questions regarding f/u therapy, DME rec's (RW vs QC), pt's balance and falls risk, etc. Dicussed home safety, fall prevention, use of RW at all times, and need for close supervision during all functional mobility to reduce falls risk. Donned knee-high TED's at bed level with totalA. Bed mobility completed mod I using hospital bed features. Able to sit unsupported at EOB while PT donned tennis shoes with bilateral AFO's (walk-on trimmable, ottobock) and explained process to daughter. Pt completed sit<>stand to RW with supervision and ambulated hallway distances, ~169ft, with RW and supervision to main rehab gym. In rehab gym, worked on stairs to simulate home setup which is 3-4 STE with 1 railing on L. Pt reports he plans on getting 2nd HR installed but not by time of DC. Pt navigated up/down x4 6inch steps using 1 HR on L and CGA for safety, instructed to complete step-to pattern to reduce falls risk which he complied with. We then ambulated to ortho rehab gym to practice cars but pt requesting urgent need to have a BM so we returned to ADL apartment with supervision assist and RW, ambulating >223ft. Pt able to manage LB dressing in standing with CGA for balance and pt was continent (required ++ time) of a very small BM - he was able to wipe but  required assist for thourghness. When standing after BM, pt reporting dizziness and requesting to sit. Once dizziness resolved, ambulated in ADL apartment room with supervision and RW to recliner. Retrieved w/c from his room for safety. Completed furniture transfer with supervision and RW and then transported back to his room in w/c due to fatigue. Pt completed ambulatory transfer in room with supervision and RW to recliner where he concluded session with safety belt alarm on, BLE elevated, all needs in reach with daughter at bedside. LPN notified after session of concern for orthostatic BP, L knee pain.  ? ?Therapy Documentation ?Precautions:  ?Precautions ?Precautions: Fall ?Precaution Comments: Bilateral foot drop (chronic, does not wear AFOs). Story teller, tangential speech ?Restrictions ?Weight Bearing Restrictions: No ?General: ?  ? ?Agitated Behavior Scale: ?TBI ?Observation Details ?Observation Environment: CIR ?Start of observation period - Date: 05/01/21 ?Start of observation period - Time: 1300 ?End of observation period - Date: 05/01/21 ?End of observation period - Time: 1410 ?Agitated Behavior Scale (DO NOT LEAVE BLANKS) ?Short attention span, easy distractibility, inability to concentrate: Present to a slight degree ?Impulsive, impatient, low tolerance for pain or frustration: Absent ?Uncooperative, resistant to care, demanding: Absent ?Violent and/or threatening violence toward people or property: Absent ?Explosive and/or unpredictable anger: Absent ?Rocking, rubbing, moaning, or other self-stimulating behavior: Absent ?Pulling at tubes, restraints, etc.: Absent ?Wandering from treatment areas: Absent ?Restlessness, pacing, excessive movement: Absent ?Repetitive behaviors, motor, and/or verbal: Present to a slight degree ?Rapid, loud, or excessive talking: Present to  a slight degree ?Sudden changes of mood: Absent ?Easily initiated or excessive crying and/or laughter: Absent ?Self-abusiveness,  physical and/or verbal: Absent ?Agitated behavior scale total score: 17 ? ? ? ?Therapy/Group: Individual Therapy ? ?Aura Bibby P Drea Jurewicz ?05/01/2021, 2:14 PM  ?

## 2021-05-02 ENCOUNTER — Other Ambulatory Visit: Payer: Self-pay | Admitting: Internal Medicine

## 2021-05-02 ENCOUNTER — Other Ambulatory Visit (HOSPITAL_COMMUNITY): Payer: Self-pay

## 2021-05-02 ENCOUNTER — Other Ambulatory Visit: Payer: Self-pay | Admitting: Cardiology

## 2021-05-02 ENCOUNTER — Other Ambulatory Visit: Payer: Self-pay

## 2021-05-02 DIAGNOSIS — R0609 Other forms of dyspnea: Secondary | ICD-10-CM

## 2021-05-02 LAB — CBC WITH DIFFERENTIAL/PLATELET
Abs Immature Granulocytes: 0.04 10*3/uL (ref 0.00–0.07)
Basophils Absolute: 0.1 10*3/uL (ref 0.0–0.1)
Basophils Relative: 1 %
Eosinophils Absolute: 0.3 10*3/uL (ref 0.0–0.5)
Eosinophils Relative: 3 %
HCT: 35.1 % — ABNORMAL LOW (ref 39.0–52.0)
Hemoglobin: 11.4 g/dL — ABNORMAL LOW (ref 13.0–17.0)
Immature Granulocytes: 0 %
Lymphocytes Relative: 12 %
Lymphs Abs: 1.3 10*3/uL (ref 0.7–4.0)
MCH: 30.4 pg (ref 26.0–34.0)
MCHC: 32.5 g/dL (ref 30.0–36.0)
MCV: 93.6 fL (ref 80.0–100.0)
Monocytes Absolute: 1.6 10*3/uL — ABNORMAL HIGH (ref 0.1–1.0)
Monocytes Relative: 14 %
Neutro Abs: 7.9 10*3/uL — ABNORMAL HIGH (ref 1.7–7.7)
Neutrophils Relative %: 70 %
Platelets: 231 10*3/uL (ref 150–400)
RBC: 3.75 MIL/uL — ABNORMAL LOW (ref 4.22–5.81)
RDW: 13.8 % (ref 11.5–15.5)
WBC: 11.2 10*3/uL — ABNORMAL HIGH (ref 4.0–10.5)
nRBC: 0 % (ref 0.0–0.2)

## 2021-05-02 LAB — BASIC METABOLIC PANEL
Anion gap: 7 (ref 5–15)
Anion gap: 4 — ABNORMAL LOW (ref 5–15)
BUN: 11 mg/dL (ref 8–23)
BUN: 12 mg/dL (ref 8–23)
CO2: 27 mmol/L (ref 22–32)
CO2: 28 mmol/L (ref 22–32)
Calcium: 8.5 mg/dL — ABNORMAL LOW (ref 8.9–10.3)
Calcium: 8.5 mg/dL — ABNORMAL LOW (ref 8.9–10.3)
Chloride: 102 mmol/L (ref 98–111)
Chloride: 103 mmol/L (ref 98–111)
Creatinine, Ser: 0.98 mg/dL (ref 0.61–1.24)
Creatinine, Ser: 1.26 mg/dL — ABNORMAL HIGH (ref 0.61–1.24)
GFR, Estimated: >60 mL/min (ref >60)
GFR, Estimated: 56 mL/min — ABNORMAL LOW (ref >60)
Glucose, Bld: 102 mg/dL — ABNORMAL HIGH (ref 70–99)
Glucose, Bld: 120 mg/dL — ABNORMAL HIGH (ref 70–99)
Potassium: 4.2 mmol/L (ref 3.5–5.1)
Potassium: 4.8 mmol/L (ref 3.5–5.1)
Sodium: 136 mmol/L (ref 135–145)
Sodium: 135 mmol/L (ref 135–145)

## 2021-05-02 LAB — SURGICAL PCR SCREEN
MRSA, PCR: NEGATIVE
Staphylococcus aureus: NEGATIVE

## 2021-05-02 MED ORDER — MUPIROCIN 2 % EX OINT
1.0000 "application " | TOPICAL_OINTMENT | Freq: Two times a day (BID) | CUTANEOUS | Status: DC
  Administered 2021-05-02: 1 via NASAL
  Filled 2021-05-02: qty 22

## 2021-05-02 MED ORDER — HYDRALAZINE HCL 10 MG PO TABS: 10.0000 mg | ORAL_TABLET | Freq: Three times a day (TID) | ORAL | Status: DC

## 2021-05-02 NOTE — Progress Notes (Signed)
Inpatient Rehabilitation Care Coordinator ?Discharge Note  ? ?Patient Details  ?Name: Calvin Brown ?MRN: [id number] ?Date of Birth: [birth year] ? ? ?Discharge location: GOING TO ACUTE FOR SURGERY AND THEN DISCHARGE HOME WITH WIFE ? ?Length of Stay: 9 days ? ?Discharge activity level: SUPERVISION-CGA LEVEL ? ?Home/community participation: ACTIVE ? ?Patient response GU:YQIHKV Literacy - How often do you need to have someone help you when you read instructions, pamphlets, or other written material from your doctor or pharmacy?: Never ? ?Patient response QQ:VZDGLO Isolation - How often do you feel lonely or isolated from those around you?: Never ? ?Services provided included: MD, RD, PT, OT, SLP, RN, CM, Pharmacy, SW ? ?Financial Services:  ?Field seismologist Utilized: Medicare ?  ? ?Choices offered to/list presented to: PT AND WIFE ? ?Follow-up services arranged:  ?Home Health, Patient/Family has no preference for HH/DME agencies ?Home Health Agency: [facility]  ?  ?  ?  ? ?Patient response to transportation need: ?Is the patient able to respond to transportation needs?: Yes ?In the past 12 months, has lack of transportation kept you from medical appointments or from getting medications?: No ?In the past 12 months, has lack of transportation kept you from meetings, work, or from getting things needed for daily living?: No ? ? ? ?Comments (or additional information):WIFE AND FAMILY MEMBERS WERE HERE DAILY AND AWARE OF PT'S CARE NEEDS. STILL HIGH RISK TO FALL REASON FOR CGA LEVEL AND ROLLING WALKER USE OVER QUAD CANE ? ?Patient/Family verbalized understanding of follow-up arrangements:  Yes ? ?Individual responsible for coordination of the follow-up plan: VICKY-WIFE [phone] ? ?Confirmed correct DME delivered: Lucy Chris 05/02/2021   ? ?Lucy Chris ?

## 2021-05-02 NOTE — Progress Notes (Signed)
Physical Therapy Session Note ? ?Patient Details  ?Name: Calvin Brown ?MRN: [id number] ?Date of Birth: [birth year] ? ?Today's Date: 05/02/2021 ?PT Individual Time: 8406-9861 ?PT Individual Time Calculation (min): 48 min  ? ?Pt in bed on bed pan with RN at bedside taking vitals. Per nursing, pt with hypotension and not able to participate in therapy at this time. Will reattempt later to make up missed time.  ? ?Returned in PM to make up missed time. ? ?Pt supine in bed with wife at bedside - pt agreeable to PT tx and denies any pain. Pt with AFO and tennis shoes on in the bed. Discussed general DC planning, DME rec's, f/u therapy recommendations (HHPT), etc with wife. Pt completed supine to sitting EOB mod I without assist or cues. At EOB, completed sit<>Stand to RW with supervision and ambulated to main rehab gym with supervision and RW, ~117ft. In rehab gym, worked on general strengthening and endurance training with repeated step up to 6inch step using 2 hand rails, 1 min stepping with 3 minutes resting, completed 4 sets. Discussed with pt throughout session home safety, fall prevention strategies, and general recovery. He ambulated back to his room with supervision and RW, slight fatigue causing increased flexion at the knees but no formal knee buckling or LOB present. Wife assisting pt with removing tennis shoes and AFO's at end of session. All needs met, pt ending session sitting EOB with wife present.  ? ? ?Alger Simons PT ?05/02/2021, 2:29 PM  ?

## 2021-05-02 NOTE — Progress Notes (Signed)
Physical Therapy Session Note ? ?Patient Details  ?Name: Calvin Brown ?MRN: [id number] ?Date of Birth: 24-Jun-1933 ? ?Today's Date: 05/02/2021 ?PT Individual Time: [id number] ?PT Individual Time Calculation (min): 43 min  ? ?Short Term Goals: ?Week 1:  PT Short Term Goal 1 (Week 1): STG = LTG due to ELOS ? ? ?Skilled Therapeutic Interventions/Progress Updates:  ? ?Pt received supine in bed and agreeable to PT. PT assisted pt to don compression stockings on BLE with total A for time management. Supine>sit transfer without assist or cues.  ? ?Gait training through hall with RW and distant supervision assist from PT for safety with occasional cues for navigation of obstacles x 158ft.   ? ?PT instructed pt in [place] level A balance program with hand out provided. Supervision assist from PT with cues for proper UE support on rail for safety.  ? ?Pt performed 5 time sit<>stand (5xSTS): 16 sec (>15 sec indicates increased fall risk)  ?Figure 8 gait of [place] level B with RW at 82ft with supervision assist x 2.  ? ?Pt reports need for BM. Ambulatory transfer to toilet with supervision assist and RW. Unable to void bowels. Ambulatory transfer to [facility]. Pt transported to orthogym in [facility] for group therapy session.  ? ?   ? ?Therapy Documentation ?Precautions:  ?Precautions ?Precautions: Fall ?Precaution Comments: Bilateral foot drop, history of frequent falls, hyperverbose ?Required Braces or Orthoses: Other Brace ?Other Brace: Bilateral AFO's (Ottobock walk-on) ?Restrictions ?Weight Bearing Restrictions: No ? ? ?Pain: ?Pain Assessment ?Pain Scale: 0-10 ?Pain Score: 0-No pain ? ? ?Therapy/Group: Individual Therapy ? ?Lorie Phenix ?05/02/2021, 12:10 PM  ?

## 2021-05-02 NOTE — Progress Notes (Signed)
Occupational Therapy TBI Note ? ?Patient Details  ?Name: Calvin Brown ?MRN: [id number] ?Date of Birth: 07/01/1933 ? ?Today's Date: 05/02/2021 ?OT Group Time: 1101-1200 ?OT Group Time Calculation (min): 59 min ? ? ?Short Term Goals: ?Week 1:  OT Short Term Goal 1 (Week 1): STGs=LTGs due to ELOS ? ?Skilled Therapeutic Interventions/Progress Updates:  ? ?Pt participate in group session with a focus on therapeutic activity of completing obstacles course to simulate community mobility as well as engaging in seated BUE therex to  increase UB strength and endurance to improve overall independence with ADLs/ functional mobility. First pt completed obstacles course with pt instructed to step over obstacles, step up onto curb, walk across uneven surfaces and maneuver RW around cones. Pt completed course with RW and CGA. In between pts turns pt also completed seated therex as indicated below, with pt completing each therex for 1 min and then resting for 30 secs. Therex included:  ?Bicep curls ?Flys ?Seated Kicks ?Seated marches ?Upright rows ?Chest presses ?OH presses ?Arm circles ?Discussed fall recovery/prevention. Discussed steps for fall recovery such as making sure you have your cellphone close by and calling for help first. Then, if you feel like you can get up discussed steps for how to get up. Visual demo provided on prone>quadraped and then using sturdy object to come up onto one knee then turning to sit into chair. Education provided on knowing the local fire dept number to assist with helping pts up if they were to have a fall at home. Education also provided on fall prevention such as removing obstacles in home such as excess clutter, remove throw rugs, allow adequate lighting, manage pets/kids when ambulating. Additionally discussed tips for community mobility such as using energy conservation strategies effectively. Pt transported back to room by this OTA where pt completed stand pivot back to recliner with RW and  CGA, pt left in recliner, set- up for lunch with alarm belt activated and all needs within reach.  ?Therapy Documentation ?Precautions:  ?Precautions ?Precautions: Fall ?Precaution Comments: Bilateral foot drop, history of frequent falls, hyperverbose ?Required Braces or Orthoses: Other Brace ?Other Brace: Bilateral AFO's (Ottobock walk-on) ?Restrictions ?Weight Bearing Restrictions: No ? ?Agitated Behavior Scale: ?TBI ?Observation Details ?Observation Environment: CIR ?Start of observation period - Date: 05/02/21 ?Start of observation period - Time: 1101 ?End of observation period - Date: 05/02/21 ?End of observation period - Time: 1200 ?Agitated Behavior Scale (DO NOT LEAVE BLANKS) ?Short attention span, easy distractibility, inability to concentrate: Present to a slight degree ?Impulsive, impatient, low tolerance for pain or frustration: Absent ?Uncooperative, resistant to care, demanding: Absent ?Violent and/or threatening violence toward people or property: Absent ?Explosive and/or unpredictable anger: Absent ?Rocking, rubbing, moaning, or other self-stimulating behavior: Absent ?Pulling at tubes, restraints, etc.: Absent ?Wandering from treatment areas: Absent ?Restlessness, pacing, excessive movement: Absent ?Repetitive behaviors, motor, and/or verbal: Absent ?Rapid, loud, or excessive talking: Absent ?Sudden changes of mood: Absent ?Easily initiated or excessive crying and/or laughter: Absent ?Self-abusiveness, physical and/or verbal: Absent ?Agitated behavior scale total score: 15 ?Pain ; no pain reported during session  ? ? ?Therapy/Group: Group Therapy ? ?[name] ?05/02/2021, 3:17 PM ?

## 2021-05-02 NOTE — Progress Notes (Addendum)
?  NEUROSURGERY PROGRESS NOTE  ? ?No issues overnight. Pt without complaint this am.  ? ?EXAM:  ?BP (!) 101/58 (BP Location: Left Arm)   Pulse 82   Temp 97.9 ?F (36.6 ?C)   Resp 16   Ht 5\' 11"  (1.803 m)   Wt 85.1 kg   SpO2 94%   BMI 26.17 kg/m?  ? ?Awake, alert, oriented  ?Speech fluent, appropriate  ?CN grossly intact  ?5/5 BUE/BLE  ? ?IMPRESSION:  ?88 y.o. male in CIR on Xarelto for Afib with slightly enlarging but asymptomatic left cSDH. ? ?PLAN: ?- Pt to be d/c'ed from CIR tomorrow, plan on left MMA embolization tomorrow am, likely admit to ICU overnight and d/c home on Saturday if stable ?- Hold Xarelto today and tomorrow ?- NPO p MN ? ? ? ?Wednesday, MD ?[facility]  ? ?

## 2021-05-02 NOTE — Progress Notes (Signed)
Was hypotensive around lunch time despite hydralazine being held this am. Once supine BP improved, afternoon doses of hydralazine was held and BP noted to be trending back to normal. On evaluation of MAR, note that BP meds have been held frequently. Repeat labs showed some rise in SCr but intake has been good. Discussed with Dr. Wynn Banker -->hydralazine was decreased to 10 mg tid.  ?

## 2021-05-02 NOTE — Progress Notes (Signed)
Inpatient Rehabilitation Discharge Medication Review by a Pharmacist ? ?A complete drug regimen review was completed for this patient to identify any potential clinically significant medication issues. ? ?High Risk Drug Classes Is patient taking? Indication by Medication  ?Antipsychotic No   ?Anticoagulant No   ?Antibiotic No   ?Opioid No   ?Antiplatelet No   ?Hypoglycemics/insulin No   ?Vasoactive Medication Yes Flomax, Isordil, Apresoline, Entresto- hypertension, HF ?Tamsulosin- urinary hesitancy  ?Chemotherapy No   ?Other Yes Crestor, Zetia- HLD ?Melatonin- sleep ?Pepcid- GERD ?Breo ellipta- Asthma  ? ? ? ?Type of Medication Issue Identified Description of Issue Recommendation(s)  ?Drug Interaction(s) (clinically significant) ?    ?Duplicate Therapy ?    ?Allergy ?    ?No Medication Administration End Date ?    ?Incorrect Dose ?    ?Additional Drug Therapy Needed ?    ?Significant med changes from prior encounter (inform family/care partners about these prior to discharge).    ?Other ?    ? ? ?Clinically significant medication issues were identified that warrant physician communication and completion of prescribed/recommended actions by midnight of the next day:  No ? ?Time spent performing this drug regimen review (minutes):  30 ? ? ?Breslin Burklow BS, PharmD, BCPS ?Clinical Pharmacist ?05/03/2021 8:13 AM ?

## 2021-05-02 NOTE — Progress Notes (Signed)
Patient ID: Calvin Brown, male   DOB: 04-Mar-1933, 88 y.o.   MRN: [id number] No preference regarding home health agency according to wife. ?

## 2021-05-02 NOTE — Discharge Summary (Signed)
Physical Therapy Discharge Summary ? ?Patient Details  ?Name: Calvin Brown ?MRN: [id number] ?Date of Birth: 11/12/1933 ? ? ?Patient has met 8 of 8 long term goals due to improved activity tolerance, improved balance, increased strength, ability to compensate for deficits, improved attention, and improved awareness.  Patient to discharge at an ambulatory level Supervision.   Patient's care partner is independent to provide the necessary physical and cognitive assistance at discharge. Family education completed with the daughter on 4/5, reviewing functional mobility, fall prevention, DME rec's, stair training, etc.  ? ?Reasons goals not met: n/a ? ?Recommendation:  ?Patient will benefit from ongoing skilled PT services in home health setting to continue to advance safe functional mobility, address ongoing impairments in dynamic standing balance, gait training, endurance training, general strengthening, and minimize fall risk. ? ?Equipment: ?No equipment provided. Pt owns RW. Pt was provided bilateral AFO's (Ottobock, walk-on) through [facility].  ? ?Reasons for discharge: treatment goals met ? ?Patient/family agrees with progress made and goals achieved: Yes ? ?PT Discharge ?Precautions/Restrictions ?Precautions ?Precaution Comments: Bilateral foot drop, history of frequent falls, hyperverbose ?Required Braces or Orthoses: Other Brace ?Other Brace: Bilateral AFO's (Ottobock walk-on) ?Restrictions ?Weight Bearing Restrictions: No ? ?Pain Interference ?Pain Interference ?Pain Effect on Sleep: 1. Rarely or not at all ?Pain Interference with Therapy Activities: 1. Rarely or not at all ?Pain Interference with Day-to-Day Activities: 1. Rarely or not at all ?Vision/Perception  ?Vision - History ?Ability to See in Adequate Light: 0 Adequate ?Perception ?Perception: Within Functional Limits ?Praxis ?Praxis: Intact  ?Cognition ?Overall Cognitive Status: History of cognitive impairments - at baseline ?Arousal/Alertness:  Awake/alert ?Orientation Level: Oriented X4 ?Attention: Sustained;Selective ?Focused Attention: Appears intact ?Sustained Attention: Impaired ?Sustained Attention Impairment: Verbal basic;Functional basic ?Selective Attention: Impaired ?Selective Attention Impairment: Verbal basic;Functional basic ?Memory: Impaired ?Memory Impairment: Storage deficit;Retrieval deficit;Decreased recall of new information ?Awareness: Impaired ?Awareness Impairment: Emergent impairment ?Problem Solving: Impaired ?Problem Solving Impairment: Functional complex;Verbal complex;Verbal basic;Functional basic ?Safety/Judgment: Impaired ?Sensation ?Sensation ?Light Touch: Appears Intact ?Hot/Cold: Appears Intact ?Proprioception: Appears Intact ?Stereognosis: Appears Intact ?Coordination ?[place] Motor Movements are Fluid and Coordinated: Yes ?Fine Motor Movements are Fluid and Coordinated: Yes ?Motor  ?Motor ?Motor: Other (comment) ?Motor - Discharge Observations: Generalized weakness and deconditioning that has improved since date of evaluation.  ?Mobility ?Bed Mobility ?Bed Mobility: Supine to Sit;Sit to Supine ?Rolling Left: Independent ?Supine to Sit: Independent ?Sit to Supine: Independent ?Transfers ?Transfers: Sit to Stand;Stand Pivot Transfers;Stand to Sit ?Sit to Stand: Supervision/Verbal cueing ?Stand to Sit: Supervision/Verbal cueing ?Stand Pivot Transfers: Supervision/Verbal cueing ?Stand Pivot Transfer Details: Verbal cues for precautions/safety ?Transfer (Assistive device): Rolling walker ?Locomotion  ?Gait ?Ambulation: Yes ?Gait Assistance: Supervision/Verbal cueing ?Gait Distance (Feet): 520 Feet (during 6MWT) ?Assistive device: Rolling walker ?Gait Assistance Details: Verbal cues for precautions/safety ?Gait ?Gait: Yes ?Gait Pattern: Impaired ?Gait Pattern: Step-through pattern;Decreased dorsiflexion - right;Decreased dorsiflexion - left;Trunk flexed;Poor foot clearance - left;Poor foot clearance - right ?Gait velocity: 0.44  m/s ?Stairs / Additional Locomotion ?Stairs: Yes ?Stairs Assistance: Contact Guard/Touching assist ?Stair Management Technique: One rail Left ?Number of Stairs: 12 ?Height of Stairs: 6 ?Wheelchair Mobility ?Wheelchair Mobility: No  ?Trunk/Postural Assessment  ?Cervical Assessment ?Cervical Assessment: Exceptions to [facility] (forward head carriage) ?Thoracic Assessment ?Thoracic Assessment: Exceptions to [facility] (rounded shoulders) ?Lumbar Assessment ?Lumbar Assessment: Exceptions to [facility] (posterior pelvic tilt) ?Postural Control ?Postural Control: Deficits on evaluation ?Righting Reactions: insufficient with dynamic standing balance tasks  ?Balance ?Balance ?Balance Assessed: Yes ?Standardized Balance Assessment ?Standardized Balance Assessment: Merrilee Jansky Balance Test ?Merrilee Jansky Balance  Test ?Sit to Stand: Able to stand  independently using hands ?Standing Unsupported: Able to stand 30 seconds unsupported (Fall posteriorly at 1:45 mark) ?Sitting with Back Unsupported but Feet Supported on Floor or Stool: Able to sit safely and securely 2 minutes ?Stand to Sit: Controls descent by using hands ?Transfers: Able to transfer with verbal cueing and /or supervision ?Standing Unsupported with Eyes Closed: Able to stand 3 seconds ?Standing Ubsupported with Feet Together: Needs help to attain position but able to stand for 30 seconds with feet together ?From Standing, Reach Forward with Outstretched Arm: Reaches forward but needs supervision ?From Standing Position, Pick up Object from Floor: Able to pick up shoe, needs supervision ?From Standing Position, Turn to Look Behind Over each Shoulder: Needs supervision when turning ?Turn 360 Degrees: Needs close supervision or verbal cueing ?Standing Unsupported, Alternately Place Feet on Step/Stool: Needs assistance to keep from falling or unable to try ?Standing Unsupported, One Foot in Front: Loses balance while stepping or standing ?Standing on One Leg: Unable to try or needs assist to prevent  fall ?Total Score: 23 ?Static Sitting Balance ?Static Sitting - Balance Support: Feet supported;No upper extremity supported ?Static Sitting - Level of Assistance: 7: Independent ?Dynamic Sitting Balance ?Dynamic Sitting - Balance Support: Feet supported;During functional activity;No upper extremity supported ?Dynamic Sitting - Level of Assistance: 5: Stand by assistance ?Static Standing Balance ?Static Standing - Balance Support: No upper extremity supported ?Static Standing - Level of Assistance: 5: Stand by assistance ?Dynamic Standing Balance ?Dynamic Standing - Balance Support: No upper extremity supported ?Dynamic Standing - Level of Assistance: 4: Min assist ?Extremity Assessment  ?  ?  ?RLE Assessment ?RLE Assessment: Exceptions to [facility] ?RLE Strength ?RLE Overall Strength: Deficits ?Right Hip Flexion: 4/5 ?Right Hip ABduction: 4+/5 ?Right Hip ADduction: 4+/5 ?Right Knee Extension: 4/5 ?Right Ankle Dorsiflexion: 2-/5 ?Right Ankle Plantar Flexion: 3-/5 ?LLE Assessment ?LLE Assessment: Exceptions to [facility] ?LLE Strength ?LLE Overall Strength: Deficits ?Left Hip Flexion: 4/5 ?Left Hip ABduction: 4+/5 ?Left Hip ADduction: 4/5 ?Left Knee Flexion: 4-/5 ?Left Knee Extension: 4+/5 ?Left Ankle Dorsiflexion: 2-/5 ?Left Ankle Plantar Flexion: 3/5 ? ? ? ? ? P  PT, DPT ?05/02/2021, 9:33 AM ?

## 2021-05-02 NOTE — Progress Notes (Signed)
Occupational Therapy Session Note ? ?Patient Details  ?Name: Calvin Brown ?MRN: [id number] ?Date of Birth: Jan 30, 1933 ? ?Today's Date: 05/02/2021 ?OT Individual Time: 0815-0900 ?OT Individual Time Calculation (min): 45 min  ? ? ?Short Term Goals: ?Week 1:  OT Short Term Goal 1 (Week 1): STGs=LTGs due to ELOS ? ? ?Skilled Therapeutic Interventions/Progress Updates:  ?  Pt semi reclined in bed, no c/o pain, requesting to bathe at shower level.  Pt completed supine to sit with supervision.  Sit to stand and ambulation to sink with CGA using RW.  Pt brushed teeth in sitting with distant supervision.  Pt ambulated to shower and doffed pants, pull up, and shirt, in standing, despite therapist cueing pt to complete in sitting for safety, and he required CGA with one LOB while doffing shirt needing mod assist to recover.  Pt bathed at sit<>stand level with CGA throughout.  Pt dried self off and ambulated to room and sat with CGA.  Pt donned pull up, pants with close supervision and shirt with setup.  Pt requesting to return to bed and completed stand pivot with CGA.  Sit to supine with supervision.  Call bell and needs in reach, bed alarm on.  Pt exhibited improved sustained attention today with decreased internal distractions.  Pt benefits from CGA and intermittent cues for safety and fall prevention. ? ?Therapy Documentation ?Precautions:  ?Precautions ?Precautions: Fall ?Precaution Comments: Bilateral foot drop, history of frequent falls, hyperverbose ?Required Braces or Orthoses: Other Brace ?Other Brace: Bilateral AFO's (Ottobock walk-on) ?Restrictions ?Weight Bearing Restrictions: No ? ? ? ?Therapy/Group: Individual Therapy ? ?Dian Situ Lorita Forinash ?05/02/2021, 12:47 PM ?

## 2021-05-02 NOTE — Progress Notes (Signed)
?                                                       PROGRESS NOTE ? ? ?Subjective/Complaints: ? ?Pt without new issues, seen in room with Dr Kathyrn Sheriff, discussed preop orders and anticoag ?Planning femoral sheath 5Fr ? ?ROS:  ?Pt denies SOB, abd pain, CP, N/V/C/D, and vision changes ? ? ?Objective: ?  ?CT HEAD WO CONTRAST (5MM) ? ?Result Date: 04/30/2021 ?CLINICAL DATA:  Subdural hematoma. EXAM: CT HEAD WITHOUT CONTRAST TECHNIQUE: Contiguous axial images were obtained from the base of the skull through the vertex without intravenous contrast. RADIATION DOSE REDUCTION: This exam was performed according to the departmental dose-optimization program which includes automated exposure control, adjustment of the mA and/or kV according to patient size and/or use of iterative reconstruction technique. COMPARISON:  04/22/2021 FINDINGS: Brain: No focal abnormality seen affecting the brainstem or cerebellum. 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 No evidence of acute or subacute infarction. There is ex vacuo enlargement of the frontal horn of the right lateral ventricle. Low-density subdural hematoma on the left is very minimally larger, maximal thickness 8 mm, with a very small amount of hyperdense blood along the left frontal convexity not seen on the previous exam. Vascular: There is atherosclerotic calcification of the major vessels at the base of the brain. Skull: Negative Sinuses/Orbits: Clear/normal Other: Right parietal scalp hematoma slightly smaller. IMPRESSION: Right parietal scalp hematoma is slightly smaller. Left-sided low-density subdural hematoma is a mm increased in thickness, now measuring 8 mm. There is a very tiny amount of hyperdense blood in the left frontal portion of the subdural collection which was not seen previously. No evidence of large amount of recent bleeding. Minimal mass effect. Old right frontal infarction is unchanged.  Electronically Signed   By: Nelson Chimes M.D.   On: 04/30/2021 11:18   ?No results for input(s): WBC, HGB, HCT, PLT in the last 72 hours. ? ?Recent Labs  ?  05/02/21 ?0502  ?NA 136  ?K 4.2  ?CL 102  ?CO2 27  ?GLUCOSE 102*  ?BUN 11  ?CREATININE 0.98  ?CALCIUM 8.5*  ? ? ? ?Intake/Output Summary (Last 24 hours) at 05/02/2021 0935 ?Last data filed at 05/02/2021 0848 ?Schulke per 24 hour  ?Intake 720 ml  ?Output --  ?Net 720 ml  ? ?  ? ?  ? ?Physical Exam: ?Vital Signs ?Blood pressure (!) 101/58, pulse 82, temperature 97.9 ?F (36.6 ?C), resp. rate 16, height 5\' 11"  (1.803 m), weight 85.1 kg, SpO2 94 %. ? ? ?General: No acute distress ?Mood and affect are appropriate ?Heart: Regular rate and rhythm no rubs murmurs or extra sounds ?Lungs: Clear to auscultation, breathing unlabored, no rales or wheezes ?Abdomen: Positive bowel sounds, soft nontender to palpation, nondistended ?Extremities: No clubbing, cyanosis, or edema ? ?Skin: No evidence of breakdown, no evidence of rash ?Neurologic: Cranial nerves II through XII intact, motor strength is 5/5 in bilateral deltoid, bicep, tricep, grip, hip flexor, knee extensors, ankle dorsiflexor and plantar flexor ?Sensory exam normal sensation to light touch and proprioception in bilateral upper and lower extremities ?Cerebellar exam normal finger to nose to finger as well as heel to shin in bilateral upper and lower extremities ?Musculoskeletal: Full range of motion in all 4 extremities. No joint  swelling ? ?Neurological:  ?   Mental Status: He is alert and oriented to person, place, and time.  ?   Comments: Left facial weakness with dysarthric speech. Alert. Memory intact. Tangential, impaired attention. He was able to follow simple motor commands.  4/5 strength throughout ? ?Assessment/Plan: ?1. Functional deficits which require 3+ hours per day of interdisciplinary therapy in a comprehensive inpatient rehab setting. ?Physiatrist is providing close team supervision and 24 hour management  of active medical problems listed below. ?Physiatrist and rehab team continue to assess barriers to discharge/monitor patient progress toward functional and medical goals ? ?Care Tool: ? ?Bathing ?   ?Body parts bathed by patient: Right arm, Left arm, Right lower leg, Left lower leg, Face, Chest, Front perineal area, Abdomen, Buttocks, Right upper leg, Left upper leg  ?   ?  ?  ?Bathing assist Assist Level: Contact Guard/Touching assist ?  ?  ?Upper Body Dressing/Undressing ?Upper body dressing   ?What is the patient wearing?: Pull over shirt ?   ?Upper body assist Assist Level: Minimal Assistance - Patient > 75% ?   ?Lower Body Dressing/Undressing ?Lower body dressing ? ? ?   ?What is the patient wearing?: Underwear/pull up, Pants ? ?  ? ?Lower body assist Assist for lower body dressing: Minimal Assistance - Patient > 75% ?   ? ?Toileting ?Toileting    ?Toileting assist Assist for toileting: Independent with assistive device ?Assistive Device Comment: urinal ?  ?Transfers ?Chair/bed transfer ? ?Transfers assist ?   ? ?Chair/bed transfer assist level: Contact Guard/Touching assist ?Chair/bed transfer assistive device: Walker ?  ?Locomotion ?Ambulation ? ? ?Ambulation assist ? ?   ? ?Assist level: Contact Guard/Touching assist ?Assistive device: Walker-rolling ?Max distance: 145ft  ? ?Walk 10 feet activity ? ? ?Assist ?   ? ?Assist level: Minimal Assistance - Patient > 75% ?Assistive device: Walker-rolling  ? ?Walk 50 feet activity ? ? ?Assist   ? ?Assist level: Minimal Assistance - Patient > 75% ?Assistive device: Walker-rolling  ? ? ?Walk 150 feet activity ? ? ?Assist   ? ?Assist level: Minimal Assistance - Patient > 75% ?Assistive device: Walker-rolling ?  ? ?Walk 10 feet on uneven surface  ?activity ? ? ?Assist Walk 10 feet on uneven surfaces activity did not occur: Safety/medical concerns ? ? ?  ?   ? ?Wheelchair ? ? ? ? ?Assist Is the patient using a wheelchair?: No ?  ?  ? ?  ?   ? ? ?Wheelchair 50 feet with 2  turns activity ? ? ? ?Assist ? ?  ?  ? ? ?   ? ?Wheelchair 150 feet activity  ? ? ? ?Assist ?   ? ? ?   ? ?Blood pressure (!) 101/58, pulse 82, temperature 97.9 ?F (36.6 ?C), resp. rate 16, height 5\' 11"  (1.803 m), weight 85.1 kg, SpO2 94 %. ? ? ? Medical Problem List and Plan: ?1. 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            -patient may shower ?            -ELOS/Goals: discharge tomorrow to OR, Dr Ralene Ok will admit to Neuro floor post op ? ?           Continue CIR- PT, OT and SLP ?2.  Antithrombotics: ?-DVT/anticoagulation:  Mechanical: Sequential compression devices, below knee Bilateral lower extremities ?            -antiplatelet therapy:  N/a ?3. Pain: continue tylenol prn.  ?4. Mood: LCSW to follow for evaluation and support.  ?            -antipsychotic agents: N/A ?5. Neuropsych: This patient may be intermittently capable of making decisions on his own behalf. ?6. Skin/Wound Care: Routine pressure relief measures.  ?7. Fluids/Electrolytes/Nutrition: Strict I/O.  ?8. hyponatremia: improving. Decrease fluid restriction to 181mL.  Improving , d/c Na Cl tabs  recheck.  ? 9. CAF: Monitor HR TID- Hold Xarelto today , may resume 24h post op ?            --follow up with Dr. Terri Skains after discharge.  ?10. Melena/H/o GIB/AVM: Had black stools yesterday--usually has BM every 5 days. Resume colace.  ?--H/H today stable. Recheck CBC in am.  ?--monitor for hematochezia/drop in H/H ?11. Chronic diastolic CHF: Monitor weights daily and for signs of overload. ?            --continue Crestor, Hydralazine, Entresto and Demadex. ?12. H/o IPF/CAP: Continue MDI. Has completed antibiotic course.  ?-- Cough resolving with  tessalon perles/Mucinex ?13. Hallucinations: Has been ongoing and he reports  2 episodes yesterday. Improving ?--Telesitter for safety.  ?14. Nightmares/Chronic insomnia: Continue Melatonin 3 mg (has nightmares with higher doses) ?15. Hypokalemia: Continue klor 20 BID.   ? 4/3- K+ 4.6- con't regimen ?16. Screening for vitamin D deficiency: vitamin D level is 101- no need for supplementation.  ?17. Suboptimal magnesium: discuss starting supplement at night with patient and w

## 2021-05-03 ENCOUNTER — Encounter (HOSPITAL_COMMUNITY)
Admission: RE | Disposition: A | Discharge status: Home / Self Care | Payer: Self-pay | Source: Ambulatory Visit | Attending: Neurosurgery

## 2021-05-03 ENCOUNTER — Inpatient Hospital Stay (HOSPITAL_COMMUNITY)
Admission: RE | Admit: 2021-05-03 | Discharge: 2021-05-05 | DRG: 270 | Disposition: A | Discharge status: Home / Self Care | Payer: Medicare Other | Source: Ambulatory Visit | Attending: Neurosurgery | Admitting: Neurosurgery

## 2021-05-03 ENCOUNTER — Encounter (HOSPITAL_COMMUNITY): Payer: Self-pay

## 2021-05-03 ENCOUNTER — Encounter (HOSPITAL_COMMUNITY): Payer: Self-pay | Admitting: Neurosurgery

## 2021-05-03 ENCOUNTER — Inpatient Hospital Stay (HOSPITAL_COMMUNITY): Payer: Medicare Other | Admitting: Certified Registered Nurse Anesthetist

## 2021-05-03 ENCOUNTER — Ambulatory Visit (HOSPITAL_COMMUNITY)
Admission: RE | Admit: 2021-05-03 | Discharge: 2021-05-03 | Disposition: A | Discharge status: Home / Self Care | Payer: Medicare Other | Source: Ambulatory Visit | Attending: Neurosurgery | Admitting: Neurosurgery

## 2021-05-03 ENCOUNTER — Encounter (HOSPITAL_COMMUNITY)
Admission: RE | Disposition: A | Discharge status: Other Acute Inpatient Hospital | Payer: Self-pay | Source: Intra-hospital | Attending: Physical Medicine and Rehabilitation

## 2021-05-03 DIAGNOSIS — Z951 Presence of aortocoronary bypass graft: Secondary | ICD-10-CM | POA: Diagnosis not present

## 2021-05-03 DIAGNOSIS — I10 Essential (primary) hypertension: Secondary | ICD-10-CM | POA: Diagnosis present

## 2021-05-03 DIAGNOSIS — I6203 Nontraumatic chronic subdural hemorrhage: Secondary | ICD-10-CM

## 2021-05-03 DIAGNOSIS — I6201 Nontraumatic acute subdural hemorrhage: Secondary | ICD-10-CM | POA: Diagnosis present

## 2021-05-03 DIAGNOSIS — Z79899 Other long term (current) drug therapy: Secondary | ICD-10-CM | POA: Diagnosis not present

## 2021-05-03 DIAGNOSIS — I2510 Atherosclerotic heart disease of native coronary artery without angina pectoris: Secondary | ICD-10-CM | POA: Diagnosis present

## 2021-05-03 DIAGNOSIS — I952 Hypotension due to drugs: Secondary | ICD-10-CM | POA: Diagnosis not present

## 2021-05-03 DIAGNOSIS — S065XAA Traumatic subdural hemorrhage with loss of consciousness status unknown, initial encounter: Secondary | ICD-10-CM | POA: Diagnosis present

## 2021-05-03 DIAGNOSIS — I951 Orthostatic hypotension: Principal | ICD-10-CM | POA: Diagnosis present

## 2021-05-03 DIAGNOSIS — E785 Hyperlipidemia, unspecified: Secondary | ICD-10-CM | POA: Diagnosis present

## 2021-05-03 DIAGNOSIS — T465X5A Adverse effect of other antihypertensive drugs, initial encounter: Secondary | ICD-10-CM | POA: Diagnosis present

## 2021-05-03 DIAGNOSIS — Z9889 Other specified postprocedural states: Principal | ICD-10-CM

## 2021-05-03 DIAGNOSIS — I4891 Unspecified atrial fibrillation: Secondary | ICD-10-CM

## 2021-05-03 DIAGNOSIS — Z7951 Long term (current) use of inhaled steroids: Secondary | ICD-10-CM | POA: Diagnosis not present

## 2021-05-03 DIAGNOSIS — I9581 Postprocedural hypotension: Secondary | ICD-10-CM | POA: Diagnosis not present

## 2021-05-03 DIAGNOSIS — Z87891 Personal history of nicotine dependence: Secondary | ICD-10-CM

## 2021-05-03 HISTORY — PX: IR NEURO EACH ADD'L AFTER BASIC UNI LEFT (MS): IMG5373

## 2021-05-03 HISTORY — PX: RADIOLOGY WITH ANESTHESIA: SHX6223

## 2021-05-03 HISTORY — PX: IR ANGIO INTRA EXTRACRAN SEL INTERNAL CAROTID UNI L MOD SED: IMG5361

## 2021-05-03 HISTORY — DX: Personal history of other medical treatment: Z92.89

## 2021-05-03 HISTORY — PX: IR TRANSCATH/EMBOLIZ: IMG695

## 2021-05-03 HISTORY — DX: Personal history of urinary calculi: Z87.442

## 2021-05-03 HISTORY — PX: IR ANGIO EXTERNAL CAROTID SEL EXT CAROTID UNI L MOD SED: IMG5370

## 2021-05-03 HISTORY — PX: IR ANGIOGRAM FOLLOW UP STUDY: IMG697

## 2021-05-03 LAB — CBC
HCT: 34 % — ABNORMAL LOW (ref 39.0–52.0)
Hemoglobin: 11.3 g/dL — ABNORMAL LOW (ref 13.0–17.0)
MCH: 31 pg (ref 26.0–34.0)
MCHC: 33.2 g/dL (ref 30.0–36.0)
MCV: 93.4 fL (ref 80.0–100.0)
Platelets: 218 10*3/uL (ref 150–400)
RBC: 3.64 MIL/uL — ABNORMAL LOW (ref 4.22–5.81)
RDW: 13.3 % (ref 11.5–15.5)
WBC: 9.6 10*3/uL (ref 4.0–10.5)
nRBC: 0 % (ref 0.0–0.2)

## 2021-05-03 LAB — BASIC METABOLIC PANEL
Anion gap: 5 (ref 5–15)
BUN: 11 mg/dL (ref 8–23)
CO2: 26 mmol/L (ref 22–32)
Calcium: 8.3 mg/dL — ABNORMAL LOW (ref 8.9–10.3)
Chloride: 103 mmol/L (ref 98–111)
Creatinine, Ser: 1.03 mg/dL (ref 0.61–1.24)
GFR, Estimated: >60 mL/min (ref >60)
Glucose, Bld: 179 mg/dL — ABNORMAL HIGH (ref 70–99)
Potassium: 5 mmol/L (ref 3.5–5.1)
Sodium: 134 mmol/L — ABNORMAL LOW (ref 135–145)

## 2021-05-03 SURGERY — IR WITH ANESTHESIA
Anesthesia: General

## 2021-05-03 MED ORDER — TAMSULOSIN HCL 0.4 MG PO CAPS
0.8000 mg | ORAL_CAPSULE | Freq: Every day | ORAL | Status: DC
  Administered 2021-05-03 – 2021-05-04 (×2): 0.8 mg via ORAL
  Filled 2021-05-03 (×3): qty 2

## 2021-05-03 MED ORDER — LIDOCAINE 2% (20 MG/ML) 5 ML SYRINGE
INTRAMUSCULAR | Status: DC | PRN
  Administered 2021-05-03: 60 mg via INTRAVENOUS

## 2021-05-03 MED ORDER — PHENYLEPHRINE 40 MCG/ML (10ML) SYRINGE FOR IV PUSH (FOR BLOOD PRESSURE SUPPORT)
PREFILLED_SYRINGE | INTRAVENOUS | Status: DC | PRN
  Administered 2021-05-03 (×3): 80 ug via INTRAVENOUS
  Administered 2021-05-03 (×2): 120 ug via INTRAVENOUS
  Administered 2021-05-03 (×2): 80 ug via INTRAVENOUS

## 2021-05-03 MED ORDER — ONDANSETRON HCL 4 MG/2ML IJ SOLN: 4.0000 mg | Freq: Once | INTRAMUSCULAR | Status: DC | PRN

## 2021-05-03 MED ORDER — MULTIVITAMINS PO CAPS: 1.0000 | ORAL_CAPSULE | Freq: Every day | ORAL | Status: DC

## 2021-05-03 MED ORDER — HYDROCODONE-ACETAMINOPHEN 5-325 MG PO TABS: 1.0000 | ORAL_TABLET | ORAL | Status: DC | PRN

## 2021-05-03 MED ORDER — IOHEXOL 300 MG/ML  SOLN
100.0000 mL | Freq: Once | INTRAMUSCULAR | Status: AC | PRN
  Administered 2021-05-03: 50 mL via INTRA_ARTERIAL

## 2021-05-03 MED ORDER — FENTANYL CITRATE (PF) 100 MCG/2ML IJ SOLN: 25.0000 ug | INTRAMUSCULAR | Status: DC | PRN

## 2021-05-03 MED ORDER — SUGAMMADEX SODIUM 200 MG/2ML IV SOLN
INTRAVENOUS | Status: DC | PRN
  Administered 2021-05-03: 200 mg via INTRAVENOUS

## 2021-05-03 MED ORDER — HEPARIN SODIUM (PORCINE) 1000 UNIT/ML IJ SOLN
INTRAMUSCULAR | Status: DC | PRN
  Administered 2021-05-03: 2000 [IU] via INTRAVENOUS

## 2021-05-03 MED ORDER — ONDANSETRON HCL 4 MG PO TABS: 4.0000 mg | ORAL_TABLET | ORAL | Status: DC | PRN

## 2021-05-03 MED ORDER — ISOSORBIDE DINITRATE 20 MG PO TABS
20.0000 mg | ORAL_TABLET | Freq: Three times a day (TID) | ORAL | Status: DC
  Administered 2021-05-03: 20 mg via ORAL
  Filled 2021-05-03 (×8): qty 1

## 2021-05-03 MED ORDER — HYDRALAZINE HCL 10 MG PO TABS
10.0000 mg | ORAL_TABLET | Freq: Three times a day (TID) | ORAL | Status: DC
  Administered 2021-05-03: 10 mg via ORAL
  Filled 2021-05-03 (×8): qty 1

## 2021-05-03 MED ORDER — EZETIMIBE 10 MG PO TABS
10.0000 mg | ORAL_TABLET | Freq: Every day | ORAL | Status: DC
  Administered 2021-05-03: 10 mg via ORAL
  Filled 2021-05-03: qty 1

## 2021-05-03 MED ORDER — EPHEDRINE SULFATE-NACL 50-0.9 MG/10ML-% IV SOSY
PREFILLED_SYRINGE | INTRAVENOUS | Status: DC | PRN
  Administered 2021-05-03: 10 mg via INTRAVENOUS
  Administered 2021-05-03: 5 mg via INTRAVENOUS
  Administered 2021-05-03: 10 mg via INTRAVENOUS

## 2021-05-03 MED ORDER — FLUTICASONE FUROATE-VILANTEROL 100-25 MCG/ACT IN AEPB
1.0000 | INHALATION_SPRAY | Freq: Every day | RESPIRATORY_TRACT | Status: DC
  Administered 2021-05-04 – 2021-05-05 (×2): 1 via RESPIRATORY_TRACT
  Filled 2021-05-03: qty 28

## 2021-05-03 MED ORDER — IOHEXOL 300 MG/ML  SOLN
100.0000 mL | Freq: Once | INTRAMUSCULAR | Status: AC | PRN
  Administered 2021-05-03: 15 mL via INTRA_ARTERIAL

## 2021-05-03 MED ORDER — SODIUM CHLORIDE 0.9 % IV BOLUS
500.0000 mL | Freq: Once | INTRAVENOUS | Status: AC
  Administered 2021-05-03: 500 mL via INTRAVENOUS

## 2021-05-03 MED ORDER — PHENYLEPHRINE HCL-NACL 20-0.9 MG/250ML-% IV SOLN
INTRAVENOUS | Status: DC | PRN
  Administered 2021-05-03: 25 ug/min via INTRAVENOUS

## 2021-05-03 MED ORDER — SACUBITRIL-VALSARTAN 49-51 MG PO TABS
1.0000 | ORAL_TABLET | Freq: Two times a day (BID) | ORAL | Status: DC
  Administered 2021-05-03: 1 via ORAL
  Filled 2021-05-03 (×6): qty 1

## 2021-05-03 MED ORDER — ORAL CARE MOUTH RINSE: 15.0000 mL | Freq: Once | OROMUCOSAL | Status: DC

## 2021-05-03 MED ORDER — TORSEMIDE 10 MG PO TABS
5.0000 mg | ORAL_TABLET | Freq: Every day | ORAL | Status: DC
  Administered 2021-05-03: 5 mg via ORAL
  Filled 2021-05-03 (×3): qty 0.5

## 2021-05-03 MED ORDER — LACTATED RINGERS IV SOLN: INTRAVENOUS | Status: DC

## 2021-05-03 MED ORDER — CHLORHEXIDINE GLUCONATE 0.12 % MT SOLN: 15.0000 mL | Freq: Once | OROMUCOSAL | Status: DC

## 2021-05-03 MED ORDER — LACTATED RINGERS IV SOLN: INTRAVENOUS | Status: DC | PRN

## 2021-05-03 MED ORDER — BENZONATATE 100 MG PO CAPS
100.0000 mg | ORAL_CAPSULE | Freq: Two times a day (BID) | ORAL | Status: DC
Start: 2021-05-03 — End: 2021-05-05
  Administered 2021-05-03: 100 mg via ORAL
  Filled 2021-05-03 (×6): qty 1

## 2021-05-03 MED ORDER — ROCURONIUM BROMIDE 10 MG/ML (PF) SYRINGE
PREFILLED_SYRINGE | INTRAVENOUS | Status: DC | PRN
  Administered 2021-05-03: 100 mg via INTRAVENOUS

## 2021-05-03 MED ORDER — PROPOFOL 10 MG/ML IV BOLUS
INTRAVENOUS | Status: DC | PRN
  Administered 2021-05-03: 100 mg via INTRAVENOUS

## 2021-05-03 MED ORDER — ONDANSETRON HCL 4 MG/2ML IJ SOLN: 4.0000 mg | INTRAMUSCULAR | Status: DC | PRN

## 2021-05-03 MED ORDER — DEXAMETHASONE SODIUM PHOSPHATE 10 MG/ML IJ SOLN
INTRAMUSCULAR | Status: DC | PRN
  Administered 2021-05-03: 4 mg via INTRAVENOUS

## 2021-05-03 MED ORDER — MORPHINE SULFATE (PF) 2 MG/ML IV SOLN: 1.0000 mg | INTRAVENOUS | Status: DC | PRN

## 2021-05-03 MED ORDER — GUAIFENESIN ER 600 MG PO TB12
1200.0000 mg | ORAL_TABLET | Freq: Two times a day (BID) | ORAL | Status: DC
  Administered 2021-05-03: 1200 mg via ORAL
  Filled 2021-05-03 (×6): qty 2

## 2021-05-03 MED ORDER — FAMOTIDINE 20 MG PO TABS
20.0000 mg | ORAL_TABLET | Freq: Two times a day (BID) | ORAL | Status: DC
  Administered 2021-05-03 – 2021-05-05 (×5): 20 mg via ORAL
  Filled 2021-05-03 (×5): qty 1

## 2021-05-03 MED ORDER — LABETALOL HCL 5 MG/ML IV SOLN: 10.0000 mg | INTRAVENOUS | Status: DC | PRN

## 2021-05-03 MED ORDER — MELATONIN 3 MG PO TABS
3.0000 mg | ORAL_TABLET | Freq: Every day | ORAL | Status: DC
  Administered 2021-05-03 – 2021-05-04 (×2): 3 mg via ORAL
  Filled 2021-05-03 (×2): qty 1

## 2021-05-03 MED ORDER — ROSUVASTATIN CALCIUM 20 MG PO TABS
20.0000 mg | ORAL_TABLET | Freq: Every day | ORAL | Status: DC
  Administered 2021-05-03 – 2021-05-04 (×2): 20 mg via ORAL
  Filled 2021-05-03 (×2): qty 1

## 2021-05-03 MED ORDER — FENTANYL CITRATE (PF) 250 MCG/5ML IJ SOLN
INTRAMUSCULAR | Status: DC | PRN
  Administered 2021-05-03: 50 ug via INTRAVENOUS

## 2021-05-03 MED ORDER — ONDANSETRON HCL 4 MG/2ML IJ SOLN
INTRAMUSCULAR | Status: DC | PRN
Start: 2021-05-03 — End: 2021-05-03
  Administered 2021-05-03: 4 mg via INTRAVENOUS

## 2021-05-03 MED ORDER — CHLORHEXIDINE GLUCONATE CLOTH 2 % EX PADS
6.0000 | MEDICATED_PAD | Freq: Every day | CUTANEOUS | Status: DC
  Administered 2021-05-04: 6 via TOPICAL

## 2021-05-03 NOTE — Progress Notes (Signed)
?   05/03/21 1938  ?Vitals  ?BP (!) 64/45 ?(NSGY paged)  ?MAP (mmHg) (!) 52  ?Pulse Rate 95  ?ECG Heart Rate 96  ?Resp (!) 26  ?Oxygen Therapy  ?SpO2 94 %  ?MEWS Score  ?MEWS Temp 0  ?MEWS Systolic 3  ?MEWS Pulse 0  ?MEWS RR 2  ?MEWS LOC 0  ?MEWS Score 5  ?MEWS Score Color Red  ?Provider Notification  ?Provider Name/Title Val Eagle, NP  ?Date Provider Notified 05/03/21  ?Time Provider Notified 1940  ?Method of Notification Call  ?Notification Reason Change in status ?(Patient SBP in the 60s-70s)  ?Provider response See new orders ?(NS bolus , STAT labs CBC, BMET)  ?Date of Provider Response 05/03/21  ?Time of Provider Response 1940  ? ? ?

## 2021-05-03 NOTE — Anesthesia Postprocedure Evaluation (Signed)
Anesthesia Post Note ? ?Patient: Calvin Brown ? ?Procedure(s) Performed: IR WITH ANESTHESIA ? ?  ? ?Patient location during evaluation: PACU ?Anesthesia Type: General ?Level of consciousness: awake and alert, oriented and patient cooperative ?Pain management: pain level controlled ?Vital Signs Assessment: post-procedure vital signs reviewed and stable ?Respiratory status: spontaneous breathing, nonlabored ventilation and respiratory function stable ?Cardiovascular status: blood pressure returned to baseline and stable ?Postop Assessment: no apparent nausea or vomiting ?Anesthetic complications: no ? ? ?No notable events documented. ? ?Last Vitals:  ?Vitals:  ? 05/03/21 1110 05/03/21 1125  ?BP: 105/68 104/61  ?Pulse: 98 94  ?Resp: (!) 23 (!) 27  ?Temp: 36.6 ?C   ?SpO2: 96% 94%  ?  ?Last Pain:  ?Vitals:  ? 05/03/21 1110  ?PainSc: 0-No pain  ? ? ?  ?  ?  ?  ?  ?  ? ?Jarome Matin Mickal Meno ? ? ? ? ?

## 2021-05-03 NOTE — Anesthesia Procedure Notes (Signed)
Arterial Line Insertion ?Start/End4/07/2021 8:00 AM, 05/03/2021 8:05 AM ?Performed by: Colin Benton, CRNA, CRNA ? Patient location: Pre-op. ?Preanesthetic checklist: patient identified, IV checked, site marked, risks and benefits discussed, surgical consent, monitors and equipment checked, pre-op evaluation, timeout performed and anesthesia consent ?Lidocaine 1% used for infiltration ?Left, radial was placed ?Catheter size: 20 G ?Hand hygiene performed , maximum sterile barriers used  and Seldinger technique used ?Allen's test indicative of satisfactory collateral circulation ?Attempts: 1 ?Procedure performed without using ultrasound guided technique. ?Following insertion, dressing applied and Biopatch. ?Post procedure assessment: normal and unchanged ? ?Patient tolerated the procedure well with no immediate complications. ? ? ?

## 2021-05-03 NOTE — Progress Notes (Signed)
?   05/03/21 2300  ?Vitals  ?BP (!) 75/41  ?MAP (mmHg) (!) 52  ?Pulse Rate 90  ?ECG Heart Rate 90  ?Resp (!) 30  ?Oxygen Therapy  ?SpO2 91 %  ?MEWS Score  ?MEWS Temp 0  ?MEWS Systolic 2  ?MEWS Pulse 0  ?MEWS RR 2  ?MEWS LOC 0  ?MEWS Score 4  ?MEWS Score Color Red  ?Provider Notification  ?Provider Name/Title Val Eagle, NP  ?Date Provider Notified 05/03/21  ?Time Provider Notified 2302  ?Method of Notification Page  ?Notification Reason Change in status ?(SPB dropped again into the 70s)  ?Provider response No new orders  ?Date of Provider Response 05/03/21  ?Time of Provider Response 2330  ? ?Per Doran Durand, NP, Dr. Lovell Sheehan stated that as long as patient is mentating properly and is neuro intact, he is okay with patient's BP dipping down into the 70s. Will continue to monitor ?

## 2021-05-03 NOTE — Progress Notes (Signed)
?   05/02/21 1353  ?Assess: MEWS Score  ?BP (!) 71/40  ?Pulse Rate 73  ?Assess: MEWS Score  ?MEWS Temp 0  ?MEWS Systolic 2  ?MEWS Pulse 0  ?MEWS RR 0  ?MEWS LOC 0  ?MEWS Score 2  ?MEWS Score Color Yellow  ?Assess: if the MEWS score is Yellow or Red  ?Were vital signs taken at a resting state? Yes  ?Focused Assessment No change from prior assessment  ?Early Detection of Sepsis Score *See Row Information* Low  ?MEWS guidelines implemented *See Row Information* Yes  ?Treat  ?MEWS Interventions Escalated (See documentation below)  ?Pain Scale 0-10  ?Pain Score 0  ?Take Vital Signs  ?Increase Vital Sign Frequency  Yellow: Q 2hr X 2 then Q 4hr X 2, if remains yellow, continue Q 4hrs  ?Escalate  ?MEWS: Escalate Yellow: discuss with charge nurse/RN and consider discussing with provider and RRT  ?Notify: Charge Nurse/RN  ?Name of Charge Nurse/RN Notified Skeet Simmer  ?Date Charge Nurse/RN Notified 05/02/21  ?Time Charge Nurse/RN Notified 1428  ?Notify: Provider  ?Provider Name/Title Marissa Nestle, PA  ?Date Provider Notified 05/02/21  ?Time Provider Notified 1429  ?Notification Type Call  ?Notification Reason Other (Comment) ?(mews)  ?Provider response No new orders  ?Date of Provider Response 05/02/21  ?Time of Provider Response 1430  ?Document  ?Patient Outcome Stabilized after interventions  ? ? ?

## 2021-05-03 NOTE — Transfer of Care (Signed)
Immediate Anesthesia Transfer of Care Note ? ?Patient: Calvin Brown ? ?Procedure(s) Performed: IR WITH ANESTHESIA ? ?Patient Location: PACU ? ?Anesthesia Type:General ? ?Level of Consciousness: drowsy and patient cooperative ? ?Airway & Oxygen Therapy: Patient Spontanous Breathing and Patient connected to face mask oxygen ? ?Post-op Assessment: Report given to RN and Post -op Vital signs reviewed and stable ? ?Post vital signs: Reviewed and stable ? ?Last Vitals:  ?Vitals Value Taken Time  ?BP 105/68 05/03/21 1110  ?Temp    ?Pulse 94 05/03/21 1115  ?Resp 17 05/03/21 1115  ?SpO2 96 % 05/03/21 1115  ?Vitals shown include unvalidated device data. ? ?Last Pain:  ?Vitals:  ? 05/03/21 0823  ?PainSc: 0-No pain  ?   ? ?  ? ?Complications: No notable events documented. ?

## 2021-05-03 NOTE — Progress Notes (Addendum)
Occupational Therapy Discharge Summary ? ?Patient Details  ?Name: Calvin Brown ?MRN: [id number] ?Date of Birth: Oct 22, 1933 ? ?Today's Date: 05/02/2021   ? ? ?Patient has met 6 of 6 long term goals due to improved activity tolerance, improved balance, ability to compensate for deficits, improved attention, and improved awareness.  He requires mod intermittent Vcs throughout basic LB self care to facilitate safety awareness and fall prevention.  Pt is at RW level and will benefit from utilizing shower seat during bathing, seated at sinkside if completing grooming at setup level. Pt has improved with attention with decreased internal distractibility noted and requiring less Vcs to redirect to task at hand. Patient to discharge at overall Supervision level.  Patient's care partner is independent to provide the necessary cognitive assistance at discharge.   ? ?Reasons goals not met: NA ? ?Recommendation:  ?Patient will benefit from ongoing skilled OT services in home health setting to continue to advance functional skills in the area of BADL and iADL, environmental assessment, and safety awareness training and education. ? ?Equipment: ?No equipment provided ? ?Reasons for discharge: treatment goals met and discharge from hospital ? ?Patient/family agrees with progress made and goals achieved: Yes ? ?OT Discharge ?Precautions/Restrictions  ?Precautions ?Precautions: Fall ?Precaution Comments: Bilateral foot drop, history of frequent falls, hyperverbose ?Other Brace: Bilateral AFO's ([place] walk-on) ?Restrictions ?Weight Bearing Restrictions: No ?Pain ?Pain Assessment ?Pain Scale: 0-10 ?Pain Score: 0-No pain ?ADL ?ADL ?Eating: Independent ?Grooming: Setup ?Where Assessed-Grooming: Sitting at sink ?Upper Body Bathing: Supervision/safety ?Where Assessed-Upper Body Bathing: Shower ?Lower Body Bathing: Supervision/safety ?Where Assessed-Lower Body Bathing: Shower ?Upper Body Dressing: Setup ?Where Assessed-Upper Body Dressing:  Sitting at sink ?Lower Body Dressing: Supervision/safety ?Where Assessed-Lower Body Dressing: Standing at sink, Sitting at sink ?Toileting: Supervision/safety ?Where Assessed-Toileting: Toilet ?Toilet Transfer: Close supervision ?Toilet Transfer Method: Ambulating ?Toilet Transfer Equipment: Bedside commode ?Walk-In Shower Transfer: Close supervision ?Walk-In Shower Transfer Method: Ambulating ?Walk-In Shower Equipment: Radio broadcast assistant ?Vision ?Baseline Vision/History: 0 No visual deficits ?Patient Visual Report: No change from baseline ?Vision Assessment?: No apparent visual deficits ?Perception  ?Perception: Within Functional Limits ?Praxis ?Praxis: Intact ?Cognition ?Cognition ?Overall Cognitive Status: History of cognitive impairments - at baseline ?Arousal/Alertness: Awake/alert ?Orientation Level: Person;Place;Situation ?Person: Oriented ?Place: Oriented ?Situation: Oriented ?Memory: Impaired ?Memory Impairment: Storage deficit;Retrieval deficit;Decreased recall of new information ?Attention: Sustained;Selective ?Focused Attention: Appears intact ?Sustained Attention: Impaired ?Sustained Attention Impairment: Verbal basic;Functional basic ?Selective Attention: Impaired ?Selective Attention Impairment: Verbal basic;Functional basic ?Awareness: Impaired ?Awareness Impairment: Emergent impairment ?Problem Solving: Impaired ?Problem Solving Impairment: Functional complex;Verbal complex;Verbal basic;Functional basic ?Executive Function: Organizing;Self Monitoring;Self Correcting ?Organizing: Impaired ?Organizing Impairment: Functional basic ?Self Monitoring: Impaired ?Self Monitoring Impairment: Functional basic ?Self Correcting: Impaired ?Self Correcting Impairment: Functional basic ?Safety/Judgment: Impaired ?[place] [employer] Scales of Cognitive Functioning: Purposeful/appropriate ?Brief Interview for Mental Status (BIMS) ?Repetition of Three Words (First Attempt): 3 ?Temporal Orientation: Year:  Correct ?Temporal Orientation: Month: Accurate within 5 days ?Temporal Orientation: Day: Correct ?Recall: "Sock": Yes, no cue required ?Recall: "Blue": Yes, no cue required ?Recall: "Bed": Yes, no cue required ?BIMS Summary Score: 15 ?Sensation ?Sensation ?Light Touch: Appears Intact ?Hot/Cold: Appears Intact ?Proprioception: Appears Intact ?Stereognosis: Appears Intact ?Coordination ?[place] Motor Movements are Fluid and Coordinated: Yes ?Fine Motor Movements are Fluid and Coordinated: Yes ?Finger Nose Finger Test: WFL BUE ?Motor  ?Motor ?Motor: Other (comment) ?Motor - Discharge Observations: Generalized weakness and deconditioning that has improved since date of evaluation. ?Mobility  ?Bed Mobility ?Bed Mobility: Supine to Sit;Sit to Supine ?Rolling Left: Independent ?Supine to Sit:  Independent ?Sit to Supine: Independent ?Transfers ?Sit to Stand: Supervision/Verbal cueing ?Stand to Sit: Supervision/Verbal cueing  ?Trunk/Postural Assessment  ?Cervical Assessment ?Cervical Assessment: Exceptions to [facility] (forward head) ?Thoracic Assessment ?Thoracic Assessment: Exceptions to [facility] (rounded shoulders) ?Lumbar Assessment ?Lumbar Assessment: Exceptions to [facility] (mild posterior pelvic tilt) ?Postural Control ?Postural Control: Deficits on evaluation ?Righting Reactions: insufficient with dynamic standing balance tasks  ?Balance ?Balance ?Balance Assessed: Yes ?Static Sitting Balance ?Static Sitting - Balance Support: Feet supported;No upper extremity supported ?Static Sitting - Level of Assistance: 7: Independent ?Dynamic Sitting Balance ?Dynamic Sitting - Balance Support: Feet supported;During functional activity;No upper extremity supported ?Dynamic Sitting - Level of Assistance: 5: Stand by assistance ?Static Standing Balance ?Static Standing - Balance Support: No upper extremity supported ?Static Standing - Level of Assistance: 5: Stand by assistance ?Dynamic Standing Balance ?Dynamic Standing - Balance Support: No upper  extremity supported ?Dynamic Standing - Level of Assistance: 4: Min assist ?Extremity/Trunk Assessment ?RUE Assessment ?RUE Assessment: Within Functional Limits ?LUE Assessment ?LUE Assessment: Within Functional Limits ? ? ?Caryl Asp Eddie Koc ?05/03/2021, 11:09 AM ?

## 2021-05-03 NOTE — H&P (Signed)
?Chief Complaint  ?Subdural hematoma ? ?History of Present Illness  ?Calvin Brown is a 88 y.o. male initially presenting to the hospital approximately 2 weeks ago with dark stools and unsteady gait.  Patient underwent work-up including CT scan of the head which demonstrated a small left-sided chronic subdural hematoma.  Ultimately patient was discharged to inpatient rehab.  Follow-up CT scan revealed mild increase in his chronic subdural hematoma with a small acute component.  He remained asymptomatic.  Treatment options were discussed and he elected to proceed with left sided middle meningeal artery embolization. ? ?Past Medical History  ? ?Past Medical History:  ?Diagnosis Date  ? Atrial fibrillation ([place])   ? CAD (coronary artery disease)   ? CVA (cerebral vascular accident) ([place]) 05/27/2013  ? GI bleed 05/27/2013  ? History of blood transfusion   ? GI Bleed  ? History of kidney stones   ? surgery to remove  ? HTN (hypertension)   ? Hx of CABG   ? Possibly 2015?  ? Hyperlipidemia   ? ? ?Past Surgical History  ? ?Past Surgical History:  ?Procedure Laterality Date  ? CORONARY ARTERY BYPASS GRAFT  1992  ? ESOPHAGOGASTRODUODENOSCOPY    ? TONSILLECTOMY AND ADENOIDECTOMY    ? ? ?Social History  ? ?Social History  ? ?Tobacco Use  ? Smoking status: Former  ?  Packs/day: 2.00  ?  Years: 12.00  ?  Pack years: 24.00  ?  Types: Cigarettes  ?  Start date: 01/28/1955  ?  Quit date: 01/28/1967  ?  Years since quitting: 54.2  ? Smokeless tobacco: Never  ?Vaping Use  ? Vaping Use: Never used  ?Substance Use Topics  ? Alcohol use: Not Currently  ? Drug use: Never  ? ? ?Medications  ? ?Prior to Admission medications   ?Medication Sig Start Date End Date Taking? Authorizing Provider  ?acetaminophen (TYLENOL) 325 MG tablet Take 1-2 tablets (325-650 mg total) by mouth every 4 (four) hours as needed for mild pain. 05/01/21   Love, Ivan Anchors, PA-C  ?benzonatate (TESSALON) 100 MG capsule Take 1 capsule (100 mg total) by mouth 2 (two) times  daily. 04/24/21   Dahal, Marlowe Aschoff, MD  ?[place] Patter 100-25 MCG/ACT AEPB TAKE 1 PUFF BY MOUTH EVERY DAY ?Patient taking differently: Inhale 1 puff into the lungs daily. 03/28/21   Margaretha Seeds, MD  ?calcium carbonate (CALCIUM 600) 600 MG TABS tablet Take 600 mg by mouth 2 (two) times daily with a meal.    [provider]  ?Cholecalciferol (VITAMIN D) 125 MCG (5000 UT) CAPS Take 5,000 Units by mouth daily.    [provider]  ?ENTRESTO 49-51 MG TAKE 1 TABLET BY MOUTH TWICE A DAY ?Patient taking differently: Take 1 tablet by mouth 2 (two) times daily. 04/10/21   Tolia, Sunit, DO  ?ezetimibe (ZETIA) 10 MG tablet Take 10 mg by mouth daily.    [provider]  ?famotidine (PEPCID) 20 MG tablet Take 20 mg by mouth 2 (two) times daily.    [provider]  ?guaiFENesin (MUCINEX) 600 MG 12 hr tablet Take 2 tablets (1,200 mg total) by mouth 2 (two) times daily. 04/24/21   Terrilee Croak, MD  ?hydrALAZINE (APRESOLINE) 25 MG tablet TAKE 1 TABLET BY MOUTH THREE TIMES A DAY ?Patient taking differently: Take 25 mg by mouth 3 (three) times daily. 02/20/21   Tolia, Sunit, DO  ?isosorbide dinitrate (ISORDIL) 20 MG tablet TAKE 1 TABLET BY MOUTH THREE TIMES A DAY ?Patient taking differently:  Take 20 mg by mouth 3 (three) times daily. 02/20/21   Tolia, Sunit, DO  ?melatonin 3 MG TABS tablet Take 1 tablet (3 mg total) by mouth at bedtime. 04/24/21   Terrilee Croak, MD  ?Misc Natural Products ([place]) Take 2 tablets by mouth daily.    [provider]  ?Multiple Vitamin (MULTIVITAMIN) capsule Take 1 capsule by mouth daily.    [provider]  ?rosuvastatin (CRESTOR) 20 MG tablet Take 1 tablet (20 mg total) by mouth at bedtime for 90 doses. 01/30/21 04/30/21  Tolia, Sunit, DO  ?tamsulosin (FLOMAX) 0.4 MG CAPS capsule Take 2 capsules (0.8 mg total) by mouth daily. 04/25/21   Terrilee Croak, MD  ?torsemide (DEMADEX) 5 MG tablet TAKE 1 TABLET BY MOUTH EVERY DAY IN THE MORNING  05/02/21   Rex Kras, DO  ? ? ?Allergies  ?No Known Allergies ? ?Review of Systems  ?ROS ? ?Neurologic Exam  ?Awake, alert, oriented ?Memory and concentration grossly intact ?Speech fluent, appropriate ?CN grossly intact ?Motor exam: ?Upper Extremities Deltoid Bicep Tricep Grip  ?Right 5/5 5/5 5/5 5/5  ?Left 5/5 5/5 5/5 5/5  ? ?Lower Extremities IP Quad PF DF EHL  ?Right 5/5 5/5 5/5 5/5 5/5  ?Left 5/5 5/5 5/5 5/5 5/5  ? ?Sensation grossly intact to LT ? ?Imaging  ?Multiple prior CT scans personally reviewed and reveal subtle enlargement of chronic left-sided subdural hematoma with small acute component on the most recent scan.  There remains minimal local mass effect.  No midline shift or hydrocephalus is noted. ? ?Impression  ?- 88 y.o. male with enlargement and acute component of largely asymptomatic chronic subdural hematoma on the left side ? ?Plan  ?-We will plan on proceeding with endovascular left middle meningeal artery embolization ? ?I have reviewed the details of the procedure as well as the expected postoperative course and recovery with the patient and his wife.  We have discussed the associated risks, benefits, and alternatives to the procedure.  All his questions today were answered and he provided informed consent to proceed. ? ?Consuella Lose, MD ?[facility]  ? ?

## 2021-05-03 NOTE — Anesthesia Preprocedure Evaluation (Addendum)
Anesthesia Evaluation  ?Patient identified by MRN, date of birth, ID band ?Patient awake ? ? ? ?Reviewed: ?Allergy & Precautions, NPO status , Patient's Chart, lab work & pertinent test results ? ?Airway ?Mallampati: IV ? ?TM Distance: >3 FB ?Neck ROM: Full ? ? ? Dental ? ?(+) Teeth Intact, Dental Advisory Given ?  ?Pulmonary ?COPD,  COPD inhaler, former smoker,  ?Quit smoking 1969, 24 pack year history  ?  ?Pulmonary exam normal ?breath sounds clear to auscultation ? ? ? ? ? ? Cardiovascular ?hypertension, Pt. on medications ?+ CAD, + CABG (1992) and + Peripheral Vascular Disease  ?Normal cardiovascular exam+ dysrhythmias Atrial Fibrillation + Valvular Problems/Murmurs (mild MR) MR  ?Rhythm:Regular Rate:Normal ? ?Echo 03/2020 ?1. Inferior basal hypokinesis. Left ventricular ejection fraction, by  ?estimation, is 55 to 60%. The left ventricle has normal function. The left  ?ventricle has no regional wall motion abnormalities. The left ventricular  ?internal cavity size was mildly  ?dilated. There is mild left ventricular hypertrophy. Left ventricular  ?diastolic parameters were normal.  ??2. Right ventricular systolic function is normal. The right ventricular  ?size is normal.  ??3. Left atrial size was severely dilated.  ??4. The mitral valve is normal in structure. Mild mitral valve  ?regurgitation. No evidence of mitral stenosis.  ??5. Tricuspid valve regurgitation is moderate.  ??6. 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 The aortic  ?valve is abnormal. Aortic valve regurgitation is mild. Mild to moderate  ?aortic valve sclerosis/calcification is present, without any evidence of  ?aortic stenosis.  ??7. Aortic dilatation noted. There is mild dilatation of the ascending  ?aorta, measuring 39 mm.  ??8. The inferior vena cava is normal in size with greater than 50%  ?respiratory variability, suggesting right atrial pressure of 3 mmHg.  ?  ?Neuro/Psych ?Subdural  hematoma- slowly enlarging but asymptomatic  ?CVA (2015) negative psych ROS  ? GI/Hepatic ?Neg liver ROS, GERD  Medicated and Controlled,  ?Endo/Other  ?negative endocrine ROS ? Renal/GU ?Renal InsufficiencyRenal diseaseCr 1.26  ?negative genitourinary ?  ?Musculoskeletal ?negative musculoskeletal ROS ?(+)  ? Abdominal ?  ?Peds ? Hematology ?negative hematology ROS ?(+) hct 35.1    ?Anesthesia Other Findings ? ? Reproductive/Obstetrics ?negative OB ROS ? ?  ? ? ? ? ? ? ? ? ? ? ? ? ? ?  ?  ? ? ? ? ? ?Anesthesia Physical ?Anesthesia Plan ? ?ASA: 3 ? ?Anesthesia Plan: General  ? ?Post-op Pain Management:   ? ?Induction: Intravenous ? ?PONV Risk Score and Plan: 2 and Ondansetron, Dexamethasone and Treatment may vary due to age or medical condition ? ?Airway Management Planned: Oral ETT and Video Laryngoscope Planned ? ?Additional Equipment: Arterial line ? ?Intra-op Plan:  ? ?Post-operative Plan: Extubation in OR and Possible Post-op intubation/ventilation ? ?Informed Consent: I have reviewed the patients History and Physical, chart, labs and discussed the procedure including the risks, benefits and alternatives for the proposed anesthesia with the patient or authorized representative who has indicated his/her understanding and acceptance.  ? ? ? ?Dental advisory given ? ?Plan Discussed with: CRNA ? ?Anesthesia Plan Comments:   ? ? ? ? ? ?Anesthesia Quick Evaluation ? ?

## 2021-05-03 NOTE — Anesthesia Procedure Notes (Signed)
Procedure Name: Intubation ?Date/Time: 05/03/2021 8:45 AM ?Performed by: Waynard Edwards, CRNA ?Pre-anesthesia Checklist: Patient identified, Emergency Drugs available, Suction available and Patient being monitored ?Patient Re-evaluated:Patient Re-evaluated prior to induction ?Oxygen Delivery Method: Circle system utilized ?Preoxygenation: Pre-oxygenation with 100% oxygen ?Induction Type: IV induction ?Ventilation: Mask ventilation without difficulty ?Laryngoscope Size: Glidescope and 3 ?Tube type: Oral ?Tube size: 7.5 mm ?Number of attempts: 1 ?Airway Equipment and Method: Oral airway, Rigid stylet and Video-laryngoscopy ?Placement Confirmation: ETT inserted through vocal cords under direct vision, positive ETCO2 and breath sounds checked- equal and bilateral ?Secured at: 22 cm ?Tube secured with: Tape ?Dental Injury: Teeth and Oropharynx as per pre-operative assessment  ? ? ? ? ?

## 2021-05-03 NOTE — Brief Op Note (Signed)
?  NEUROSURGERY BRIEF OPERATIVE  NOTE  ? ?PREOP DX: chronic left SDH ? ?POSTOP DX: Same ? ?PROCEDURE: Diagnostic cerebral angiogram, onyx embolization of left MMA ? ?SURGEON: Dr. Lisbeth Renshaw, MD ? ?ANESTHESIA: GETA ? ?EBL: Minimal ? ?SPECIMENS: None ? ?COMPLICATIONS: None ? ?CONDITION: Stable to recovery ? ?FINDINGS (Full report in [facility]): ?1. Successful left MMA embolization with Onyx-18 ? ? ?Lisbeth Renshaw, MD ?[facility]  ? ?

## 2021-05-03 NOTE — Sedation Documentation (Signed)
Bedside report given. Groin site and pulses checked ?

## 2021-05-04 DIAGNOSIS — Z9889 Other specified postprocedural states: Secondary | ICD-10-CM

## 2021-05-04 DIAGNOSIS — I9581 Postprocedural hypotension: Secondary | ICD-10-CM

## 2021-05-04 NOTE — Discharge Instructions (Signed)
Please call if temperature is greater than 101.5 ?Call if there are neurological changes.  ?Call if there is swelling in the groin ?

## 2021-05-04 NOTE — Consult Note (Signed)
? ?NAME:  Calvin Brown, MRN:  [id number], DOB:  Jun 11, 1933, LOS: 1 ?ADMISSION DATE:  05/03/2021, CONSULTATION DATE: 05/04/2021 ?REFERRING MD: Neurosurgery, CHIEF COMPLAINT: Hypertension ? ?History of Present Illness:  ?88 year old male with a plethora of health issues including chronic subdural hematoma, stroke, hypertension, coronary artery disease, congestive heart failure who has intermittent episodes of orthostatic hypotension he may be coupled with his getting out of atrial fibrillation.  Pulmonary critical care was called to the bedside 05/04/2021 after episode of hypotension systolic pressure 68.  On arrival he was found to be systolic blood pressure 123456 awake alert in no obvious distress.  In talking with his wife this is not uncommon for him to have several episodes of orthostatic hypotension during the day which they treat with compression hose in the patient is having 20s needs until he recovers.  He is on multiple antihypertensives and diuretics.  No intervention is required currently he may need to reevaluate his medications cardiac in the near future.  Pulmonary critical care will continue to follow ? ?Pertinent  Medical History  ? ?Past Medical History:  ?Diagnosis Date  ? Atrial fibrillation ([place])   ? CAD (coronary artery disease)   ? CVA (cerebral vascular accident) ([place]) 05/27/2013  ? GI bleed 05/27/2013  ? History of blood transfusion   ? GI Bleed  ? History of kidney stones   ? surgery to remove  ? HTN (hypertension)   ? Hx of CABG   ? Possibly 2015?  ? Hyperlipidemia   ? ? ? ?Significant Hospital Events: ?Including procedures, antibiotic start and stop dates in addition to other pertinent events   ?Past MMM embolization ? ?Interim History / Subjective:  ?88 year old with transient episode of hypotension ? ?Objective   ?Blood pressure (!) 73/50, pulse 81, temperature 97.9 ?F (36.6 ?C), temperature source Oral, resp. rate (!) 21, height 5\' 11"  (1.803 m), weight 82.1 kg, SpO2 93 %. ?   ?   ? ?Intake/Output  Summary (Last 24 hours) at 05/04/2021 1552 ?Last data filed at 05/04/2021 1400 ?Joss per 24 hour  ?Intake 1879.25 ml  ?Output 1875 ml  ?Net 4.25 ml  ? ?Filed Weights  ? 05/03/21 1826  ?Weight: 82.1 kg  ? ? ?Examination: ?General: Awake alert no acute distress ?HENT: No JVD is appreciated ?Lungs: Diminished breath sounds in the bases ?Cardiovascular: Heart sounds are regular at this time ?Abdomen: Soft nontender ?Extremities: Warm and dry ?Neuro: Left lower extremity weakness from previous stroke ?GU: Voids ? ?Resolved Hospital Problem list   ? ? ?Assessment & Plan:  ?Hypotension in the setting of chronic orthostatic hypotension while on diuretics beta-blockers and anticoagulants.  He just had successful left MM a embolization on 05/03/2021 for chronic subdural hematoma.  He was preparing for discharge home when he had static hypotension with systolic blood pressure remaining 68.  Pulmonary critical care was called to the neurosurgical intensive care unit for further evaluation ?Current blood pressure systolic [id number] ?No current interventions are required ?Reevaluate antihypertensives ?Suspect there is a component of dehydration ?He has known heart disease along with atrial fibrillation and stroke last 2D echo with inferior basal hypokinesis EF of 55 to 60% mild left ventricular hypertrophy moderate tricuspid valve regurgitation ?He also takes [place] on a regular basis. ?Hold antihypertensives at this time ?Consider reevaluate in doses and timing of antihypertensives ?Pulmonary critical care will continue to follow ? ?Status post embolization for treatment of chronic subdural hematoma ?Per neurosurgery and neuro interventional radiology ? ?Coronary artery  disease hypertension ?Consider cardiology consult ? ? ? ?Best Practice (right click and "Reselect all SmartList Selections" daily)  ? ?Diet/type: Regular consistency (see orders) ?DVT prophylaxis: DOAC ?GI prophylaxis: PPI ?Lines: N/A ?Foley:   N/A ?Code Status:  full code ?Last date of multidisciplinary goals of care discussion []  ? ?Labs   ?CBC: ?Recent Labs  ?Lab 05/02/21 ?1526 05/03/21 ?2006  ?WBC 11.2* 9.6  ?NEUTROABS 7.9*  --   ?HGB 11.4* 11.3*  ?HCT 35.1* 34.0*  ?MCV 93.6 93.4  ?PLT 231 218  ? ? ?Basic Metabolic Panel: ?Recent Labs  ?Lab 04/28/21 ?[id number] 04/29/21 ?0719 05/02/21 ?0502 05/02/21 ?1526 05/03/21 ?2006  ?NA 131* 134* 136 135 134*  ?K 4.4 4.6 4.2 4.8 5.0  ?CL 99 98 102 103 103  ?CO2 26 32 27 28 26   ?GLUCOSE 102* 95 102* 120* 179*  ?BUN 10 7* 11 12 11   ?CREATININE 0.68 0.93 0.98 1.26* 1.03  ?CALCIUM 8.0* 8.7* 8.5* 8.5* 8.3*  ? ?GFR: ?Estimated Creatinine Clearance: 55.8 mL/min (by C-G formula based on SCr of 1.03 mg/dL). ?Recent Labs  ?Lab 05/02/21 ?1526 05/03/21 ?2006  ?WBC 11.2* 9.6  ? ? ?Liver Function Tests: ?No results for input(s): AST, ALT, ALKPHOS, BILITOT, PROT, ALBUMIN in the last 168 hours. ?No results for input(s): LIPASE, AMYLASE in the last 168 hours. ?No results for input(s): AMMONIA in the last 168 hours. ? ?ABG ?No results found for: PHART, PCO2ART, PO2ART, HCO3, TCO2, ACIDBASEDEF, O2SAT  ? ?Coagulation Profile: ?No results for input(s): INR, PROTIME in the last 168 hours. ? ?Cardiac Enzymes: ?No results for input(s): CKTOTAL, CKMB, CKMBINDEX, TROPONINI in the last 168 hours. ? ?HbA1C: ?No results found for: HGBA1C ? ?CBG: ?No results for input(s): GLUCAP in the last 168 hours. ? ?Review of Systems:   ?10 point review of system taken, please see HPI for positives and negatives. ?Positive for routinely (orthostatic hypotension ?Recent blood pressure resulting in fall ? ? ?Past Medical History:  ?He,  has a past medical history of Atrial fibrillation ([place]), CAD (coronary artery disease), CVA (cerebral vascular accident) ([place]) (05/27/2013), GI bleed (05/27/2013), History of blood transfusion, History of kidney stones, HTN (hypertension), CABG, and Hyperlipidemia.  ? ?Surgical History:  ? ?Past Surgical History:  ?Procedure  Laterality Date  ? CORONARY ARTERY BYPASS GRAFT  1992  ? ESOPHAGOGASTRODUODENOSCOPY    ? IR TRANSCATH/EMBOLIZ  05/03/2021  ? RADIOLOGY WITH ANESTHESIA N/A 05/03/2021  ? Procedure: IR WITH ANESTHESIA;  Surgeon: Consuella Lose, MD;  Location: [facility];  Service: Radiology;  Laterality: N/A;  ? TONSILLECTOMY AND ADENOIDECTOMY    ?  ? ?Social History:  ? reports that he quit smoking about 54 years ago. His smoking use included cigarettes. He started smoking about 66 years ago. He has a 24.00 pack-year smoking history. He has never used smokeless tobacco. He reports that he does not currently use alcohol. He reports that he does not use drugs.  ? ?Family History:  ?His family history includes Breast cancer (age of onset: 36) in his mother; Heart attack (age of onset: 52) in his sister; Hypertension in his father; Vision loss in his mother.  ? ?Allergies ?No Known Allergies  ? ?Home Medications  ?Prior to Admission medications   ?Medication Sig Start Date End Date Taking? Authorizing Provider  ?acetaminophen (TYLENOL) 325 MG tablet Take 1-2 tablets (325-650 mg total) by mouth every 4 (four) hours as needed for mild pain. 05/01/21   Love, Ivan Anchors, PA-C  ?benzonatate (TESSALON) 100 MG capsule Take 1  capsule (100 mg total) by mouth 2 (two) times daily. 04/24/21   Dahal, Marlowe Aschoff, MD  ?[place] Patter 100-25 MCG/ACT AEPB TAKE 1 PUFF BY MOUTH EVERY DAY ?Patient taking differently: Inhale 1 puff into the lungs daily. 03/28/21   Margaretha Seeds, MD  ?calcium carbonate (CALCIUM 600) 600 MG TABS tablet Take 600 mg by mouth 2 (two) times daily with a meal.    [provider]  ?Cholecalciferol (VITAMIN D) 125 MCG (5000 UT) CAPS Take 5,000 Units by mouth daily.    [provider]  ?ENTRESTO 49-51 MG TAKE 1 TABLET BY MOUTH TWICE A DAY ?Patient taking differently: Take 1 tablet by mouth 2 (two) times daily. 04/10/21   Tolia, Sunit, DO  ?ezetimibe (ZETIA) 10 MG tablet Take 10 mg by mouth daily.    [provider]   ?famotidine (PEPCID) 20 MG tablet Take 20 mg by mouth 2 (two) times daily.    [provider]  ?guaiFENesin (MUCINEX) 600 MG 12 hr tablet Take 2 tablets (1,200 mg total) by mouth 2 (two) times daily. 3

## 2021-05-04 NOTE — Discharge Summary (Signed)
Physician Discharge Summary  ?Patient ID: ?Calvin Brown ?MRN: [id number] ?DOB/AGE: [birth year] 86 y.o. ? ?Admit date: 05/03/2021 ?Discharge date: 05/04/2021 ? ?Admission Diagnoses:Subdural hematoma ? ?Discharge Diagnoses:  ?Principal Problem: ?  Status post coil embolization of cerebral aneurysm ?Active Problems: ?  Subdural hematoma ([facility]) ? ? ?Discharged Condition: good ? ?Hospital Course: Mr. Strehle was admitted from the rehab service secondary to subdural hematoma ? ?Treatments: Middle meningeal artery embolization, left ? ?Discharge Exam: ?Blood pressure (!) 102/47, pulse 86, temperature 97.9 ?F (36.6 ?C), temperature source Oral, resp. rate 17, height 5\' 11"  (1.803 m), weight 82.1 kg, SpO2 92 %. ?General appearance: alert, cooperative, appears stated age, and no distress ? ?Disposition: Discharge disposition: 01-Home or Self Care ? ? ? ? ? ?Subdural hematoma ? ?Allergies as of 05/04/2021   ?No Known Allergies ?  ? ?  ?Medication List  ?  ? ?TAKE these medications   ? ?acetaminophen 325 MG tablet ?Commonly known as: TYLENOL ?Take 1-2 tablets (325-650 mg total) by mouth every 4 (four) hours as needed for mild pain. ?  ?benzonatate 100 MG capsule ?Commonly known as: TESSALON ?Take 1 capsule (100 mg total) by mouth 2 (two) times daily. ?  ?Breo Ellipta 100-25 MCG/ACT Aepb ?Generic drug: fluticasone furoate-vilanterol ?TAKE 1 PUFF BY MOUTH EVERY DAY ?What changed: See the new instructions. ?  ?Calcium 600 600 MG Tabs tablet ?Generic drug: calcium carbonate ?Take 600 mg by mouth 2 (two) times daily with a meal. ?  ?Entresto 49-51 MG ?Generic drug: sacubitril-valsartan ?TAKE 1 TABLET BY MOUTH TWICE A DAY ?  ?ezetimibe 10 MG tablet ?Commonly known as: ZETIA ?Take 10 mg by mouth daily. ?  ?famotidine 20 MG tablet ?Commonly known as: PEPCID ?Take 20 mg by mouth 2 (two) times daily. ?  ?guaiFENesin 600 MG 12 hr tablet ?Commonly known as: [place] ?Take 2 tablets (1,200 mg total) by mouth 2 (two) times daily. ?  ?hydrALAZINE 25 MG  tablet ?Commonly known as: APRESOLINE ?TAKE 1 TABLET BY MOUTH THREE TIMES A DAY ?  ?isosorbide dinitrate 20 MG tablet ?Commonly known as: ISORDIL ?TAKE 1 TABLET BY MOUTH THREE TIMES A DAY ?  ?melatonin 3 MG Tabs tablet ?Take 1 tablet (3 mg total) by mouth at bedtime. ?  ?MENS PROSTATE HEALTH FORMULA PO ?Take 2 tablets by mouth daily. ?  ?multivitamin capsule ?Take 1 capsule by mouth daily. ?  ?rosuvastatin 20 MG tablet ?Commonly known as: CRESTOR ?Take 1 tablet (20 mg total) by mouth at bedtime for 90 doses. ?  ?tamsulosin 0.4 MG Caps capsule ?Commonly known as: FLOMAX ?Take 2 capsules (0.8 mg total) by mouth daily. ?  ?torsemide 5 MG tablet ?Commonly known as: DEMADEX ?TAKE 1 TABLET BY MOUTH EVERY DAY IN THE MORNING ?  ?Vitamin D 125 MCG (5000 UT) Caps ?Take 5,000 Units by mouth daily. ?  ? ?  ? ? Follow-up Information   ? ? Consuella Lose, MD Follow up in 2 week(s).   ?Specialty: Neurosurgery ?Why: please call to make an appointment ?Contact information: ?[address] ?[address] ?[place] [address] ?[phone] ? ? ?  ?  ? ?  ?  ? ?  ? ? ?Signed: ?Ashok Pall ?05/04/2021, 1:20 PM ?  ? ?

## 2021-05-05 DIAGNOSIS — I952 Hypotension due to drugs: Secondary | ICD-10-CM

## 2021-05-05 MED ORDER — RIVAROXABAN 20 MG PO TABS
20.0000 mg | ORAL_TABLET | Freq: Every day | ORAL | Status: DC
  Filled 2021-05-05: qty 1

## 2021-05-05 MED ORDER — RIVAROXABAN 20 MG PO TABS: 20.0000 mg | ORAL_TABLET | Freq: Every day | ORAL | 0 refills | Status: DC

## 2021-05-05 NOTE — Progress Notes (Signed)
? ?NAME:  Calvin Brown, MRN:  [id number], DOB:  1933-12-24, LOS: 2 ?ADMISSION DATE:  05/03/2021, CONSULTATION DATE: 05/04/2021 ?REFERRING MD: Neurosurgery, CHIEF COMPLAINT: Hypertension ? ?History of Present Illness:  ?88 year old male with a plethora of health issues including chronic subdural hematoma, stroke, hypertension, coronary artery disease, congestive heart failure who has intermittent episodes of orthostatic hypotension he may be coupled with his getting out of atrial fibrillation.  Pulmonary critical care was called to the bedside 05/04/2021 after episode of hypotension systolic pressure 68.  On arrival he was found to be systolic blood pressure 104 awake alert in no obvious distress.  In talking with his wife this is not uncommon for him to have several episodes of orthostatic hypotension during the day which they treat with compression hose in the patient is having 20s needs until he recovers.  He is on multiple antihypertensives and diuretics.  No intervention is required currently he may need to reevaluate his medications cardiac in the near future.  Pulmonary critical care will continue to follow ? ?Pertinent  Medical History  ? ?Past Medical History:  ?Diagnosis Date  ? Atrial fibrillation ([facility])   ? CAD (coronary artery disease)   ? CVA (cerebral vascular accident) ([facility]) 05/27/2013  ? GI bleed 05/27/2013  ? History of blood transfusion   ? GI Bleed  ? History of kidney stones   ? surgery to remove  ? HTN (hypertension)   ? Hx of CABG   ? Possibly 2015?  ? Hyperlipidemia   ? ? ? ?Significant Hospital Events: ?Including procedures, antibiotic start and stop dates in addition to other pertinent events   ?Past MMM embolization ? ?Interim History / Subjective:  ?No overnight issues. BP SBP 100-130s overnight. Asymptomatic. Eating popsicle this morning. Ok to go home.  ? ?Objective   ?Blood pressure 100/64, pulse 81, temperature 98 ?F (36.7 ?C), resp. rate (!) 23, height 5\' 11"  (1.803 m), weight 82.1 kg, SpO2 94  %. ?   ?   ? ?Intake/Output Summary (Last 24 hours) at 05/05/2021 0940 ?Last data filed at 05/05/2021 0800 ?[place] per 24 hour  ?Intake 720 ml  ?Output 2225 ml  ?Net -1505 ml  ? ?Filed Weights  ? 05/03/21 1826  ?Weight: 82.1 kg  ? ? ?Examination: ?Awake, alert no distress ?Irregularly irregular HR ?Breathing stable on room air no distres ?No edema ? ?Resolved Hospital Problem list   ? ? ?Assessment & Plan:  ?88 yo man with HTN and CAD. Here POD 2 from MMA embolization for chronic subdural hematoma. ? ?Relative Hypotension ?CAD ?HTN ?Atrial Fibrillation ?Multiple anti-hypertensives at home. Recent titration of BP meds.  ?Recommend holding all anti-hypertensives at discharge and keeping BP at home as they are already doing. Instructed patient and wife to call Dr. 86 office this week to discuss ongoing management of BP. Has been having orthostatic hypotension at home ? ?Status post embolization for treatment of chronic subdural hematoma 05/03/21 ?Per neurosurgery and neuro interventional radiology ? ?Ok for discharge with this current plan.  ? ?07/03/21, MD ?Pulmonary and Critical Care Medicine ?[facility] ?05/05/2021 9:43 AM ?Pager: see AMION ? ?If no response to pager, please call critical care on call (see AMION) until 7pm ?After 7:00 pm call Elink   ? ? ?Labs   ?CBC: ?Recent Labs  ?Lab 05/02/21 ?1526 05/03/21 ?2006  ?WBC 11.2* 9.6  ?NEUTROABS 7.9*  --   ?HGB 11.4* 11.3*  ?HCT 35.1* 34.0*  ?MCV 93.6 93.4  ?PLT 231 218  ? ? ?  Basic Metabolic Panel: ?Recent Labs  ?Lab 04/29/21 ?0719 05/02/21 ?0502 05/02/21 ?1526 05/03/21 ?2006  ?NA 134* 136 135 134*  ?K 4.6 4.2 4.8 5.0  ?CL 98 102 103 103  ?CO2 32 27 28 26   ?GLUCOSE 95 102* 120* 179*  ?BUN 7* 11 12 11   ?CREATININE 0.93 0.98 1.26* 1.03  ?CALCIUM 8.7* 8.5* 8.5* 8.3*  ? ?GFR: ?Estimated Creatinine Clearance: 55.8 mL/min (by C-G formula based on SCr of 1.03 mg/dL). ?Recent Labs  ?Lab 05/02/21 ?1526 05/03/21 ?2006  ?WBC 11.2* 9.6  ? ? ?Liver Function Tests: ?No  results for input(s): AST, ALT, ALKPHOS, BILITOT, PROT, ALBUMIN in the last 168 hours. ?No results for input(s): LIPASE, AMYLASE in the last 168 hours. ?No results for input(s): AMMONIA in the last 168 hours. ? ?ABG ?No results found for: PHART, PCO2ART, PO2ART, HCO3, TCO2, ACIDBASEDEF, O2SAT  ? ?Coagulation Profile: ?No results for input(s): INR, PROTIME in the last 168 hours. ? ?Cardiac Enzymes: ?No results for input(s): CKTOTAL, CKMB, CKMBINDEX, TROPONINI in the last 168 hours. ? ? ? ?

## 2021-05-05 NOTE — Progress Notes (Signed)
AVS discharge paperwork reviewed with patient and wife. Informed it is OK to re-start Xarelto once home per Dr. Franky Macho. Instructed to contact cardiologist office regarding BP meds. Med list reviewed and instructed on next doses, informed how to set up follow-up with Dr. Conchita Paris, notified of prior scheduled office appointments with Dr. Everardo All and Dr. Odis Hollingshead. Understanding of information confirmed through teach-back by patient and wife. All belongings sent home. Taken down to car in wheelchair. ?

## 2021-05-05 NOTE — Discharge Summary (Signed)
?BP (!) 101/43   Pulse 72   Temp 98 ?F (36.7 ?C)   Resp (!) 25   Ht 5\' 11"  (1.803 m)   Wt 82.1 kg   SpO2 95%   BMI 25.24 kg/m?  ?Physician Discharge Summary  ?Patient ID: ?[name] ?MRN: [id number] ?DOB/AGE: 86-Jun-1935 86 y.o. ?  ?Admit date: 05/03/2021 ?Discharge date: 05/05/2021 ?  ?Admission Diagnoses:Subdural hematoma ?  ?Discharge Diagnoses:  ?Principal Problem: ?  Status post coil embolization of cerebral aneurysm ?Active Problems: ?  Subdural hematoma (HCC) ?  ?  ?Discharged Condition: good ?  ?Hospital Course: Mr. Schwindt was admitted from the rehab service secondary to subdural hematoma ?  ?Treatments: Middle meningeal artery embolization, left ?  ?Discharge Exam: ?Blood pressure (!) 102/47, pulse 86, temperature 97.9 ?F (36.6 ?C), temperature source Oral, resp. rate 17, height 5\' 11"  (1.803 m), weight 82.1 kg, SpO2 92 %. ?General appearance: alert, cooperative, appears stated age, and no distress ?  ?Disposition: Discharge disposition: 01-Home or Self Care ?  ?  ?  ?  ?  ?Subdural hematoma ?  ?Allergies as of 05/04/2021   ?No Known Allergies ?   ?  ?   ?Medication List  ?   ?  ?TAKE these medications   ?  ?acetaminophen 325 MG tablet ?Commonly known as: TYLENOL ?Take 1-2 tablets (325-650 mg total) by mouth every 4 (four) hours as needed for mild pain. ?   ?benzonatate 100 MG capsule ?Commonly known as: TESSALON ?Take 1 capsule (100 mg total) by mouth 2 (two) times daily. ?   ?Breo Ellipta 100-25 MCG/ACT Aepb ?Generic drug: fluticasone furoate-vilanterol ?TAKE 1 PUFF BY MOUTH EVERY DAY ?What changed: See the new instructions. ?   ?Calcium 600 600 MG Tabs tablet ?Generic drug: calcium carbonate ?Take 600 mg by mouth 2 (two) times daily with a meal. ?   ?Entresto 49-51 MG ?Generic drug: sacubitril-valsartan ?TAKE 1 TABLET BY MOUTH TWICE A DAY ?   ?ezetimibe 10 MG tablet ?Commonly known as: ZETIA ?Take 10 mg by mouth daily. ?   ?famotidine 20 MG tablet ?Commonly known as: PEPCID ?Take 20 mg by mouth 2 (two)  times daily. ?   ?guaiFENesin 600 MG 12 hr tablet ?Commonly known as: [place] ?Take 2 tablets (1,200 mg total) by mouth 2 (two) times daily. ?   ?hydrALAZINE 25 MG tablet ?Commonly known as: APRESOLINE ?TAKE 1 TABLET BY MOUTH THREE TIMES A DAY ?   ?isosorbide dinitrate 20 MG tablet ?Commonly known as: ISORDIL ?TAKE 1 TABLET BY MOUTH THREE TIMES A DAY ?   ?melatonin 3 MG Tabs tablet ?Take 1 tablet (3 mg total) by mouth at bedtime. ?   ?MENS PROSTATE HEALTH FORMULA PO ?Take 2 tablets by mouth daily. ?   ?multivitamin capsule ?Take 1 capsule by mouth daily. ?   ?rosuvastatin 20 MG tablet ?Commonly known as: CRESTOR ?Take 1 tablet (20 mg total) by mouth at bedtime for 90 doses. ?   ?tamsulosin 0.4 MG Caps capsule ?Commonly known as: FLOMAX ?Take 2 capsules (0.8 mg total) by mouth daily. ?   ?torsemide 5 MG tablet ?Commonly known as: DEMADEX ?TAKE 1 TABLET BY MOUTH EVERY DAY IN THE MORNING ?   ?Vitamin D 125 MCG (5000 UT) Caps ?Take 5,000 Units by mouth daily. ?   ?  ?   ?  ?  Follow-up Information   ?  ?  Consuella Lose, MD Follow up in 2 week(s).   ?Specialty: Neurosurgery ?Why: please call to make  an appointment ?Contact information: ?[address] ?[address] ?[place] [address]  ?  ? ?

## 2021-05-06 ENCOUNTER — Telehealth: Payer: Self-pay | Admitting: Cardiology

## 2021-05-06 NOTE — Telephone Encounter (Signed)
Called and spoke to patient's wife she was transferred to Dr Odis Hollingshead for further assistance

## 2021-05-06 NOTE — Telephone Encounter (Signed)
Can you call and get some more information regarding this please? ? ?ST

## 2021-05-06 NOTE — Telephone Encounter (Signed)
Patient's wife says patient is having low bp after a procedure he had done last week, and she would like to speak with you Lyndon Code) about some guidance on that. Please call her back today at number provided.  ?

## 2021-05-13 ENCOUNTER — Other Ambulatory Visit: Payer: Self-pay | Admitting: Neurological Surgery

## 2021-05-13 ENCOUNTER — Other Ambulatory Visit (HOSPITAL_COMMUNITY): Payer: Self-pay | Admitting: Neurological Surgery

## 2021-05-13 DIAGNOSIS — I6203 Nontraumatic chronic subdural hemorrhage: Secondary | ICD-10-CM

## 2021-05-14 ENCOUNTER — Other Ambulatory Visit: Payer: Self-pay

## 2021-05-14 ENCOUNTER — Encounter (HOSPITAL_COMMUNITY): Payer: Self-pay

## 2021-05-14 DIAGNOSIS — I10 Essential (primary) hypertension: Secondary | ICD-10-CM

## 2021-05-14 DIAGNOSIS — I5032 Chronic diastolic (congestive) heart failure: Secondary | ICD-10-CM

## 2021-05-14 DIAGNOSIS — I2510 Atherosclerotic heart disease of native coronary artery without angina pectoris: Secondary | ICD-10-CM

## 2021-05-14 DIAGNOSIS — R0609 Other forms of dyspnea: Secondary | ICD-10-CM

## 2021-05-16 ENCOUNTER — Ambulatory Visit: Payer: Medicare Other

## 2021-05-16 DIAGNOSIS — I6523 Occlusion and stenosis of bilateral carotid arteries: Secondary | ICD-10-CM

## 2021-05-17 NOTE — Telephone Encounter (Signed)
Spoke to his wife last week.  ?Hold torsemide. ?Start Entresto 24/26 mg p.o. twice daily ?We will have labs prior to his next office visit. ? ?Calvin Rayos Terri Skains, DO, [facility]

## 2021-05-17 NOTE — Telephone Encounter (Signed)
Called and spoke to patient's wife and she stated his bp have been 107/51 , 108/60 , 93/58 it has been running low lately he is taking Entresto 24/26 BID and they are aware they need to get labs done she stated they are going next week. She did mention patient was still taking torsemide every other day I told her per Dr. Terri Skains due to his past history he should hold off and only take it if his weight goes up and his bp goes above 120 patient's current weight is 173lb

## 2021-05-20 ENCOUNTER — Other Ambulatory Visit: Payer: Self-pay | Admitting: Cardiology

## 2021-05-20 ENCOUNTER — Ambulatory Visit (HOSPITAL_COMMUNITY)
Admission: RE | Admit: 2021-05-20 | Discharge: 2021-05-20 | Disposition: A | Discharge status: Home / Self Care | Payer: Medicare Other | Source: Ambulatory Visit | Attending: Neurological Surgery | Admitting: Neurological Surgery

## 2021-05-20 DIAGNOSIS — I6203 Nontraumatic chronic subdural hemorrhage: Secondary | ICD-10-CM | POA: Diagnosis not present

## 2021-05-20 DIAGNOSIS — I10 Essential (primary) hypertension: Secondary | ICD-10-CM

## 2021-05-20 DIAGNOSIS — R0609 Other forms of dyspnea: Secondary | ICD-10-CM

## 2021-05-20 DIAGNOSIS — I2510 Atherosclerotic heart disease of native coronary artery without angina pectoris: Secondary | ICD-10-CM

## 2021-05-20 DIAGNOSIS — Z951 Presence of aortocoronary bypass graft: Secondary | ICD-10-CM

## 2021-05-23 ENCOUNTER — Ambulatory Visit (INDEPENDENT_AMBULATORY_CARE_PROVIDER_SITE_OTHER)
Admission: RE | Admit: 2021-05-23 | Discharge: 2021-05-23 | Disposition: A | Discharge status: Home / Self Care | Payer: Medicare Other | Source: Ambulatory Visit | Attending: Physician Assistant | Admitting: Physician Assistant

## 2021-05-23 ENCOUNTER — Ambulatory Visit (INDEPENDENT_AMBULATORY_CARE_PROVIDER_SITE_OTHER): Payer: Medicare Other | Admitting: Physician Assistant

## 2021-05-23 ENCOUNTER — Encounter: Payer: Self-pay | Admitting: Physician Assistant

## 2021-05-23 VITALS — BP 118/62 | HR 61 | Ht 71.0 in | Wt 182.0 lb

## 2021-05-23 DIAGNOSIS — K5909 Other constipation: Secondary | ICD-10-CM

## 2021-05-23 DIAGNOSIS — K602 Anal fissure, unspecified: Secondary | ICD-10-CM

## 2021-05-23 DIAGNOSIS — I6523 Occlusion and stenosis of bilateral carotid arteries: Secondary | ICD-10-CM | POA: Diagnosis not present

## 2021-05-23 DIAGNOSIS — K6289 Other specified diseases of anus and rectum: Secondary | ICD-10-CM

## 2021-05-23 LAB — BMP8+EGFR
BUN/Creatinine Ratio: 20 (ref 10–24)
BUN: 16 mg/dL (ref 8–27)
CO2: 27 mmol/L (ref 20–29)
Calcium: 8.7 mg/dL (ref 8.6–10.2)
Chloride: 103 mmol/L (ref 96–106)
Creatinine, Ser: 0.79 mg/dL (ref 0.76–1.27)
Glucose: 93 mg/dL (ref 70–99)
Potassium: 4.1 mmol/L (ref 3.5–5.2)
Sodium: 140 mmol/L (ref 134–144)
eGFR: 87 mL/min/{1.73_m2} (ref >59)

## 2021-05-23 LAB — PRO B NATRIURETIC PEPTIDE: NT-Pro BNP: 570 pg/mL — ABNORMAL HIGH (ref 0–486)

## 2021-05-23 LAB — MAGNESIUM: Magnesium: 1.9 mg/dL (ref 1.6–2.3)

## 2021-05-23 MED ORDER — AMBULATORY NON FORMULARY MEDICATION: 1 refills | Status: DC

## 2021-05-23 MED ORDER — LINACLOTIDE 145 MCG PO CAPS: 145.0000 ug | ORAL_CAPSULE | Freq: Every day | ORAL | 3 refills | Status: DC

## 2021-05-23 NOTE — Progress Notes (Signed)
? ?Chief Complaint: Change in bowel habits ? ?HPI: ?   Calvin Brown is an 88 year old male with a past medical history of CVA, CAD and A-fib on Xarelto, who was referred to me by Charlane Ferretti, DO for a complaint of change in bowel habits.   ?   04/24/2021-05/05/2021 patient admitted to the hospital for traumatic brain injury.  CT of the head showed a subdural hematoma.  He then went through inpatient rehab. ?   05/03/2021 CBC with hemoglobin 11.3.  BMP with a sodium minimally decreased at 134. ?   Today, the patient presents to clinic accompanied by his wife and together they explained that he has always had chronic constipation, he would typically take 10-15 Colace a day in order to have a bowel movement.  He has done this for years.  He has never been on a prescription medication.  After recent hospitalization as above his constipation has worsened.  In fact he has had only minuscule bowel movements over the past 2 weeks until this morning when he tells me it was about 8 inches in length, but still very slender.  Describes that he is having a lot of lower abdominal pain and in fact also rectal pain when he passes a stool.  He has tried on 1 or 2 occasions to use suppositories/enemas but they have not really helped.  They have also tried multiple other over-the-counter products which do not seem to be providing results.  Associated symptoms include a decrease in appetite and weight loss.  His wife tells me that often he is trying to have a bowel movement 8-10 times a day but nothing comes out and he is "screaming out in pain". ?   Does tell me he has had a colonoscopy years ago, about 17 and was told it was normal and that he did not need anymore. ?   Patient previously worked as an Investment banker, operational. ?   Denies fever, chills or blood in his stool. ? ?Past Medical History:  ?Diagnosis Date  ? Atrial fibrillation (HCC)   ? CAD (coronary artery disease)   ? CVA (cerebral vascular accident) (HCC) 05/27/2013  ? GI bleed  05/27/2013  ? History of blood transfusion   ? GI Bleed  ? History of kidney stones   ? surgery to remove  ? HTN (hypertension)   ? Hx of CABG   ? Possibly 2015?  ? Hyperlipidemia   ? ? ?Past Surgical History:  ?Procedure Laterality Date  ? CORONARY ARTERY BYPASS GRAFT  1992  ? ESOPHAGOGASTRODUODENOSCOPY    ? IR ANGIO EXTERNAL CAROTID SEL EXT CAROTID UNI L MOD SED  05/03/2021  ? IR ANGIO INTRA EXTRACRAN SEL INTERNAL CAROTID UNI L MOD SED  05/03/2021  ? IR ANGIOGRAM FOLLOW UP STUDY  05/03/2021  ? IR NEURO EACH ADD'L AFTER BASIC UNI LEFT (MS)  05/03/2021  ? IR TRANSCATH/EMBOLIZ  05/03/2021  ? RADIOLOGY WITH ANESTHESIA N/A 05/03/2021  ? Procedure: IR WITH ANESTHESIA;  Surgeon: Lisbeth Renshaw, MD;  Location: [facility] OR;  Service: Radiology;  Laterality: N/A;  ? TONSILLECTOMY AND ADENOIDECTOMY    ? ? ?Current Outpatient Medications  ?Medication Sig Dispense Refill  ? acetaminophen (TYLENOL) 325 MG tablet Take 1-2 tablets (325-650 mg total) by mouth every 4 (four) hours as needed for mild pain.    ? benzonatate (TESSALON) 100 MG capsule Take 1 capsule (100 mg total) by mouth 2 (two) times daily. 20 capsule 0  ? BREO ELLIPTA 100-25 MCG/ACT AEPB TAKE  1 PUFF BY MOUTH EVERY DAY (Patient taking differently: Inhale 1 puff into the lungs daily.) 60 each 2  ? calcium carbonate (CALCIUM 600) 600 MG TABS tablet Take 600 mg by mouth 2 (two) times daily with a meal.    ? Cholecalciferol (VITAMIN D) 125 MCG (5000 UT) CAPS Take 5,000 Units by mouth daily.    ? ENTRESTO 49-51 MG TAKE 1 TABLET BY MOUTH TWICE A DAY (Patient taking differently: Take 1 tablet by mouth 2 (two) times daily.) 60 tablet 0  ? ezetimibe (ZETIA) 10 MG tablet Take 10 mg by mouth daily.    ? famotidine (PEPCID) 20 MG tablet Take 20 mg by mouth 2 (two) times daily.    ? guaiFENesin (MUCINEX) 600 MG 12 hr tablet Take 2 tablets (1,200 mg total) by mouth 2 (two) times daily.    ? hydrALAZINE (APRESOLINE) 25 MG tablet TAKE 1 TABLET BY MOUTH THREE TIMES A DAY 270 tablet 0  ?  isosorbide dinitrate (ISORDIL) 20 MG tablet TAKE 1 TABLET BY MOUTH THREE TIMES A DAY 270 tablet 0  ? melatonin 3 MG TABS tablet Take 1 tablet (3 mg total) by mouth at bedtime.  0  ? Misc Natural Products (MENS PROSTATE HEALTH FORMULA PO) Take 2 tablets by mouth daily.    ? Multiple Vitamin (MULTIVITAMIN) capsule Take 1 capsule by mouth daily.    ? rivaroxaban (XARELTO) 20 MG TABS tablet Take 1 tablet (20 mg total) by mouth daily with supper. 30 tablet 0  ? rivaroxaban (XARELTO) 20 MG TABS tablet Take 20 mg by mouth daily with supper.    ? rosuvastatin (CRESTOR) 20 MG tablet Take 1 tablet (20 mg total) by mouth at bedtime for 90 doses.    ? tamsulosin (FLOMAX) 0.4 MG CAPS capsule Take 2 capsules (0.8 mg total) by mouth daily. 30 capsule   ? torsemide (DEMADEX) 5 MG tablet TAKE 1 TABLET BY MOUTH EVERY DAY IN THE MORNING 30 tablet 0  ? ?No current facility-administered medications for this visit.  ? ? ?Allergies as of 05/23/2021  ? (No Known Allergies)  ? ? ?Family History  ?Problem Relation Age of Onset  ? Vision loss Mother   ? Breast cancer Mother 72  ? Hypertension Father   ? Heart attack Sister 30  ? ? ?Social History  ? ?Socioeconomic History  ? Marital status: Married  ?  Spouse name: Not on file  ? Number of children: 3  ? Years of education: Not on file  ? Highest education level: Not on file  ?Occupational History  ? Not on file  ?Tobacco Use  ? Smoking status: Former  ?  Packs/day: 2.00  ?  Years: 12.00  ?  Pack years: 24.00  ?  Types: Cigarettes  ?  Start date: 01/28/1955  ?  Quit date: 01/28/1967  ?  Years since quitting: 54.3  ? Smokeless tobacco: Never  ?Vaping Use  ? Vaping Use: Never used  ?Substance and Sexual Activity  ? Alcohol use: Not Currently  ? Drug use: Never  ? Sexual activity: Not Currently  ?Other Topics Concern  ? Not on file  ?Social History Narrative  ? Not on file  ? ?Social Determinants of Health  ? ?Financial Resource Strain: Not on file  ?Food Insecurity: Not on file  ?Transportation  Needs: Not on file  ?Physical Activity: Not on file  ?Stress: Not on file  ?Social Connections: Not on file  ?Intimate Partner Violence: Not on file  ? ? ?  Review of Systems:    ?Constitutional: No fever or chills ?Skin: No rash  ?Cardiovascular: No chest pain ?Respiratory: No SOB  ?Gastrointestinal: See HPI and otherwise negative ?Genitourinary: No dysuria ?Neurological: No headache ?Musculoskeletal: No new muscle or joint pain ?Hematologic: No bleeding  ?Psychiatric: No history of depression or anxiety ? ? Physical Exam:  ?Vital signs: ?BP 118/62   Pulse 61   Ht 5\' 11"  (1.803 m)   Wt 182 lb (82.6 kg)   SpO2 94%   BMI 25.38 kg/m?   ? ?Constitutional:   Pleasant elderly Caucasian male appears to be in NAD, Well developed, Well nourished, alert and cooperative ?Head:  Normocephalic and atraumatic. ?Eyes:   PEERL, EOMI. No icterus. Conjunctiva pink. ?Ears:  Normal auditory acuity. ?Neck:  Supple ?Throat: Oral cavity and pharynx without inflammation, swelling or lesion.  ?Respiratory: Respirations even and unlabored. Lungs clear to auscultation bilaterally.   No wheezes, crackles, or rhonchi.  ?Cardiovascular: Normal S1, S2. No MRG. Regular rate and rhythm. No peripheral edema, cyanosis or pallor.  ?Gastrointestinal:  Soft, nondistended, mild bilateral lower quadrant TTP. No rebound or guarding. Normal bowel sounds. No appreciable masses or hepatomegaly. ?Rectal: External: Decreased sphincter tone with visible soft solid brown stool protruding from the rectal vault; internal: Some tenderness to palpation posteriorly, no mass or stool ball; anoscopy: Anal fissure versus stercoral ulcer tender to palpation posteriorly ?Msk:  Symmetrical without Reindel deformities. Without edema, no deformity or joint abnormality.  ?Neurologic:  Alert and  oriented x4;  grossly normal neurologically.  ?Skin:   Dry and intact without significant lesions or rashes. ?Psychiatric: Demonstrates good judgement and reason without abnormal  affect or behaviors. ? ?RELEVANT LABS AND IMAGING: ?CBC ?   ?Component Value Date/Time  ? WBC 9.6 05/03/2021 2006  ? RBC 3.64 (L) 05/03/2021 2006  ? HGB 11.3 (L) 05/03/2021 2006  ? HGB 12.9 (L) 02/04/2021 0939  ? HCT 34.0

## 2021-05-23 NOTE — Patient Instructions (Signed)
Your provider has requested that you have an abdominal x ray before leaving today. Please go to the basement floor to our Radiology department for the test. ? ?Wait until we have x-ray results - Victorino Dike recommends that you complete a bowel purge (to clean out your bowels). Please do the following: ?Purchase a bottle of Miralax over the counter as well as a box of 5 mg dulcolax tablets. ?Take 4 dulcolax tablets. ?Wait 1 hour. ?You will then drink 6-8 capfuls of Miralax mixed in an adequate amount of water/juice/gatorade (you may choose which of these liquids to drink) over the next 2-3 hours. ?You should expect results within 1 to 6 hours after completing the bowel purge. ? ? ?We have sent the following medications to your pharmacy for you to pick up at your convenience: ?Linzess 145 mcg daily before breakfast.  ? ?We have sent a prescription for Diltiazem 2% gel to [facility] for you. Using your index finger, you should apply a small amount of medication inside the rectum up to your first knuckle/joint three times daily x 6-8 weeks. ? ?[facility]'s information is below: ?Address: [address] [place], [place] [address]  ?[phone] ? ?*Please DO NOT go directly from our office to pick up this medication! Give the pharmacy 1 day to process the prescription as this is compounded and takes time to make. ? ?May use Reticare with Lidocaine as needed for pain.  ? ? ?

## 2021-05-27 ENCOUNTER — Telehealth: Payer: Self-pay | Admitting: *Deleted

## 2021-05-27 ENCOUNTER — Telehealth: Payer: Self-pay

## 2021-05-27 NOTE — Telephone Encounter (Signed)
Called and spoke with patient's wife. She states that pt did the bowel purge on Saturday morning and he did not have any results. Pt repeated the bowel purge yesterday and had good results. Pt's wife has been advised that pt should continue Recticare TID as needed after a BM. Pt's wife states that pt needs an afternoon appt. Pt has been scheduled for a f/u appt with Dr. Myrtie Neither on Wednesday, 06/26/21 at 1:40 pm. Pt's wife verbalized understanding of all information and had no concerns at the end of the call. ?

## 2021-05-27 NOTE — Progress Notes (Signed)
____________________________________________________________ ? ?Attending physician addendum: ? ?Thank you for sending this case to me. ?I have reviewed the entire note and agree with the plan. ? ?He is KUB confirms a large amount of retained stool, and I see that you have subsequently had nursing advised him to do the MiraLAX bowel purge.  Given the anal fissure, he should avoid any further attempts at enemas for now. ? ?He should also use the RectiCare 3 times daily and as needed for pain after bowel movements. ?Please be sure he is on the schedule to see me in several weeks. ? ?Amada Jupiter, MD ? ?____________________________________________________________ ? ?

## 2021-05-27 NOTE — Telephone Encounter (Signed)
-----   Message from Unk Lightning, [place] sent at 05/27/2021 10:26 AM EDT ----- ?Regarding: See Dr. Irving Burton recs ?Can you call and check on this patient and see how he is doing.  He should definitely be using the RectiCare 3 times a day and as needed for pain as we discussed in clinic.  Dr. Myrtie Neither would like to see him himself for follow-up.  Can you make sure that it is set up. ? ?Thanks, JL L ?----- Message ----- ?From: Sherrilyn Rist, MD ?Sent: 05/27/2021  10:24 AM EDT ?To: Unk Lightning, PA ? ? ? ? ?----- Message ----- ?From: Unk Lightning, PA ?Sent: 05/23/2021   4:10 PM EDT ?To: Sherrilyn Rist, MD ? ? ?

## 2021-05-27 NOTE — Telephone Encounter (Signed)
[facility] called for approval for PT POC 1wk1 2wk4 1 wk4, and OT POC 1wk8.  Approval given. ?

## 2021-05-31 ENCOUNTER — Encounter: Payer: Self-pay | Admitting: Cardiology

## 2021-05-31 ENCOUNTER — Ambulatory Visit: Payer: Medicare Other | Admitting: Cardiology

## 2021-05-31 VITALS — BP 128/69 | HR 85 | Temp 97.2°F | Resp 16 | Ht 71.0 in | Wt 181.6 lb

## 2021-05-31 DIAGNOSIS — E782 Mixed hyperlipidemia: Secondary | ICD-10-CM

## 2021-05-31 DIAGNOSIS — Z951 Presence of aortocoronary bypass graft: Secondary | ICD-10-CM

## 2021-05-31 DIAGNOSIS — Z7901 Long term (current) use of anticoagulants: Secondary | ICD-10-CM

## 2021-05-31 DIAGNOSIS — I2510 Atherosclerotic heart disease of native coronary artery without angina pectoris: Secondary | ICD-10-CM

## 2021-05-31 DIAGNOSIS — I6523 Occlusion and stenosis of bilateral carotid arteries: Secondary | ICD-10-CM

## 2021-05-31 DIAGNOSIS — Z8719 Personal history of other diseases of the digestive system: Secondary | ICD-10-CM

## 2021-05-31 DIAGNOSIS — I4819 Other persistent atrial fibrillation: Secondary | ICD-10-CM

## 2021-05-31 DIAGNOSIS — I5032 Chronic diastolic (congestive) heart failure: Secondary | ICD-10-CM

## 2021-05-31 DIAGNOSIS — S065XAA Traumatic subdural hemorrhage with loss of consciousness status unknown, initial encounter: Secondary | ICD-10-CM

## 2021-05-31 DIAGNOSIS — I700 Atherosclerosis of aorta: Secondary | ICD-10-CM

## 2021-05-31 DIAGNOSIS — S0003XD Contusion of scalp, subsequent encounter: Secondary | ICD-10-CM

## 2021-05-31 MED ORDER — LISINOPRIL 20 MG PO TABS: 20.0000 mg | ORAL_TABLET | Freq: Every morning | ORAL | 0 refills | Status: DC

## 2021-05-31 NOTE — Progress Notes (Signed)
? ?ID:  Calvin Brown, DOB December 02, 1933, MRN [id number] ? ?PCP:  Sueanne Margarita, DO  ?Cardiologist:  Rex Kras, DO, [facility] (established care 10/08/2020) ?Former Cardiology Providers: Dr. Sabra Heck ? ?Date: 05/31/21 ?Last Office Visit: 02/26/2021 ? ?Chief Complaint  ?Patient presents with  ? Atrial Fibrillation  ? Coronary Artery Disease  ? heart failure management  ? Follow-up  ? ? 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 ? ?Patient is being managed by the practice for CAD and permanent atrial fibrillation.  She is accompanied by his wife [name] at today's visit. ? ?His last stress test was in April 2022 when he was formally under the care of Dr. Sabra Heck back in [place].  He had a nuclear stress test and per report is noted to have inferior and lateral fixed defects with possibility of peri-infarct ischemia.  At that time he refused invasive angiography and medical therapy was recommended.  And he has been on medical therapy for his underlying HFpEF with slow titration of GDMT as per hemodynamics and laboratory values. ? ?He also history of persistent atrial fibrillation and has been on Xarelto for thromboembolic prophylaxis.  And has preferred rate controlling strategy as opposed to rhythm management.  His AV nodal blocking agents were discontinued given his soft blood pressures. ? ?Since last office visit patient has had multiple hospitalization due to epidural hematoma as well as subdural hematoma requiring embolization of the left middle  meningeal artery.  He has undergone rehab and almost back to baseline.  He has bilateral foot drops and currently wears braces and ambulates with a cane.  Patient was recommended to discontinue oral anticoagulation given his bleeding events but he is too concerned of the possibility of stroke and chooses to be on oral anticoagulation. ? ?Of note, prior to his epidural and subdural hematomas I have had long discussions with the patient and his wife with regards to left atrial appendage closure device for thromboembolic prophylaxis as he had a history of GI bleed secondary to AVMs back in 2015.  At that time patient wanted to hold off on being considered for Watchman device. ? ?Patient denies any anginal discomfort or heart failure symptoms.  He is lost approximately 15 pounds since last office visit due to his recurrent hospitalizations for reasons mentioned above.  He is experiencing Parfitt hematuria now and is currently being worked up by urology and has a CT scans upcoming. ? ?FUNCTIONAL STATUS: ?No structured exercise program or daily routine.  ? ?ALLERGIES: ?No Known Allergies ? ?MEDICATION LIST PRIOR TO VISIT: ?Current Meds  ?Medication Sig  ? acetaminophen (TYLENOL) 325 MG tablet Take 1-2 tablets (325-650 mg total) by mouth every 4 (four) hours as needed for mild pain.  ? AMBULATORY NON FORMULARY MEDICATION Medication Name: Diltiazem ointment - Using your index finger, apply a small amount of medication inside the rectum up to your first knuckle/joint three times daily x 6-8 weeks.  ? BREO ELLIPTA 100-25 MCG/ACT AEPB TAKE 1 PUFF BY MOUTH EVERY DAY (Patient taking differently: Inhale 1 puff into the lungs daily.)  ?  calcium carbonate (OS-CAL) 600 MG TABS tablet Take 600 mg by mouth 2 (two) times daily with a meal.  ? Cholecalciferol (VITAMIN D) 125 MCG (5000 UT) CAPS Take 5,000 Units by mouth daily.  ? ezetimibe (ZETIA) 10 MG tablet Take 10 mg by mouth daily.  ? famotidine (PEPCID) 20 MG tablet Take 20 mg by  mouth 2 (two) times daily.  ? lisinopril (ZESTRIL) 20 MG tablet Take 1 tablet (20 mg total) by mouth every morning.  ? Misc Natural Products (MENS PROSTATE HEALTH FORMULA PO) Take 2 tablets by mouth daily.  ? Multiple Vitamin (MULTIVITAMIN) capsule Take 1 capsule by mouth daily.  ? Rivaroxaban (XARELTO) 15 MG TABS tablet Take 15 mg by mouth daily with supper.  ? rosuvastatin (CRESTOR) 20 MG tablet Take 1 tablet (20 mg total) by mouth at bedtime for 90 doses.  ? tamsulosin (FLOMAX) 0.4 MG CAPS capsule Take 2 capsules (0.8 mg total) by mouth daily.  ? [DISCONTINUED] ENTRESTO 49-51 MG TAKE 1 TABLET BY MOUTH TWICE A DAY (Patient taking differently: Take 1 tablet by mouth 2 (two) times daily. 1/2 tablet  BID)  ?  ? ?PAST MEDICAL HISTORY: ?Past Medical History:  ?Diagnosis Date  ? Atrial fibrillation ([place])   ? CAD (coronary artery disease)   ? CVA (cerebral vascular accident) ([place]) 05/27/2013  ? GI bleed 05/27/2013  ? History of blood transfusion   ? GI Bleed  ? History of kidney stones   ? surgery to remove  ? HTN (hypertension)   ? Hx of CABG   ? Possibly 2015?  ? Hyperlipidemia   ? ? ?PAST SURGICAL HISTORY: ?Past Surgical History:  ?Procedure Laterality Date  ? CORONARY ARTERY BYPASS GRAFT  1992  ? ESOPHAGOGASTRODUODENOSCOPY    ? IR ANGIO EXTERNAL CAROTID SEL EXT CAROTID UNI L MOD SED  05/03/2021  ? IR ANGIO INTRA EXTRACRAN SEL INTERNAL CAROTID UNI L MOD SED  05/03/2021  ? IR ANGIOGRAM FOLLOW UP STUDY  05/03/2021  ? IR NEURO EACH ADD'L AFTER BASIC UNI LEFT (MS)  05/03/2021  ? IR TRANSCATH/EMBOLIZ  05/03/2021  ? RADIOLOGY WITH ANESTHESIA N/A 05/03/2021  ? Procedure: IR WITH ANESTHESIA;  Surgeon: Consuella Lose, MD;  Location: [facility];  Service: Radiology;  Laterality: N/A;  ? TONSILLECTOMY AND ADENOIDECTOMY    ? ? ?FAMILY HISTORY: ?The patient family history includes Breast cancer (age of onset: 16) in his mother; Heart attack (age of onset: 71) in his sister; Heart disease in his sister; Hypertension in his father; Vision loss  in his mother. ? ?SOCIAL HISTORY:  ?The patient  reports that he quit smoking about 54 years ago. His smoking use included cigarettes. He started smoking about 66 years ago. He has a 24.00 pack-year smoking history. He has never used smokeless tobacco. He reports that he does not currently use alcohol. He reports that he does not use drugs. ? ?REVIEW OF SYSTEMS: ?Review of Systems  ?Constitutional: Positive for malaise/fatigue.  ?Cardiovascular:  Negative for chest pain, dyspnea on exertion, leg swelling, orthopnea, palpitations, paroxysmal nocturnal dyspnea and syncope.  ?Musculoskeletal:   ?     Ambulates with a cane  ?Genitourinary:  Positive for hematuria.  ?Neurological:   ?     Bilateral foot drops  ? ?PHYSICAL EXAM: ? ?  05/31/2021  ?  2:30 PM 05/23/2021  ?  2:50 PM 05/05/2021  ? 12:00 PM  ?Vitals with BMI  ?Height 5\' 11"  5\' 11"    ?Weight 181 lbs 10 oz 182 lbs   ?  BMI 25.34 25.4   ?Systolic 0000000 [id number]  ?Diastolic 69 62 55  ?Pulse 85 61 80  ? ? ?CONSTITUTIONAL: Well-developed and well-nourished. No acute distress.  ?SKIN: Skin is warm and dry. No rash noted. No cyanosis. No pallor. No jaundice ?HEAD: Normocephalic and atraumatic.  ?EYES: No scleral icterus ?MOUTH/THROAT: Moist oral membranes.  ?NECK: No JVD present. No thyromegaly noted. Right carotid bruits  ?LYMPHATIC: No visible cervical adenopathy.  ?CHEST Normal respiratory effort. No intercostal retractions.  Surgical scar is well-healed. ?LUNGS: Clear to auscultation bilaterally.  No stridor. No wheezes. No rales.  ?CARDIOVASCULAR: Irregularly irregular, variable [id number], soft holosystolic murmur heard at the apex, no gallops or rubs. ?ABDOMINAL: Soft, nontender, nondistended, positive bowel sounds in all 4 quadrants, no apparent ascites.  ?EXTREMITIES: +1 bilateral pitting edema, 2+ DP and PT pulses, chronic venous insufficiency ?HEMATOLOGIC: No significant bruising ?NEUROLOGIC: Oriented to person, place, and time. Nonfocal. Normal muscle tone.  ?PSYCHIATRIC:  Normal mood and affect. Normal behavior. Cooperative ? ?RADIOLOGY: ?April 23, 2021: ?CT of the head without contrast: ?No acute intracranial findings are seen in noncontrast CT brain. Old infarct is seen

## 2021-06-04 ENCOUNTER — Other Ambulatory Visit: Payer: Self-pay | Admitting: Cardiology

## 2021-06-04 DIAGNOSIS — R0609 Other forms of dyspnea: Secondary | ICD-10-CM

## 2021-06-05 ENCOUNTER — Telehealth: Payer: Self-pay

## 2021-06-05 ENCOUNTER — Other Ambulatory Visit: Payer: Self-pay

## 2021-06-05 MED ORDER — OMEPRAZOLE 20 MG PO CPDR: 20.0000 mg | DELAYED_RELEASE_CAPSULE | Freq: Every day | ORAL | 11 refills | Status: AC

## 2021-06-05 NOTE — Telephone Encounter (Signed)
[facility] called and stated that the pts Bp in the afternoon was 92/48 06/04/2021 and it was even lower in the morning. They are concerned that this may be to low. Please advise. ? ?[phone] ?

## 2021-06-05 NOTE — Telephone Encounter (Signed)
Called and spoke to pts wife and reviewed medications. Pt is taking omeprazole instead of pepcid. Updated in [facility]. Pts bp was 65/49 this morning and he was having a hard time even lifting up a spoon. The pt did not take any BP medications today. Informed them to hold bp medications when systolic is less than 120 mmHg. Please advise.

## 2021-06-05 NOTE — Telephone Encounter (Signed)
Please reconcile his medications. ? 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  ? ?In the interim, have him check his blood pressures prior to taking his antihypertensive medications.  IF systolic blood pressures is less than 120 mmHg he is asked to hold the current dose and reevaluate at the next dose. ? ?Dr. Terri Skains

## 2021-06-10 NOTE — Telephone Encounter (Signed)
Pt has not bee taking Bp since last Tuesday, Pts bp was low until today. Pts bp today was 122/78, he took his BP medication and later his BP was 125/72. Pt is better now that he is taking it PRN.

## 2021-06-10 NOTE — Telephone Encounter (Signed)
Pt has not bee taking Bp since last Tuesday, Pts bp was low until today. Pts bp today was 122/78, he took his BP medication and later his BP was 125/72. Pt is better now that he is taking it PRN.

## 2021-06-10 NOTE — Telephone Encounter (Signed)
Please call the patient's wife and see how he is doing. ? ?Brynli Ollis Odis Hollingshead, DO, [facility]

## 2021-06-11 NOTE — Telephone Encounter (Signed)
Continue to take BP as needed. Hold if SBP<182mmHG.  ? ?Dr. Terri Skains

## 2021-06-11 NOTE — Telephone Encounter (Signed)
Pt aware.

## 2021-06-13 ENCOUNTER — Other Ambulatory Visit: Payer: Self-pay | Admitting: Urology

## 2021-06-20 DIAGNOSIS — I5032 Chronic diastolic (congestive) heart failure: Secondary | ICD-10-CM

## 2021-06-20 DIAGNOSIS — I2510 Atherosclerotic heart disease of native coronary artery without angina pectoris: Secondary | ICD-10-CM | POA: Diagnosis not present

## 2021-06-20 DIAGNOSIS — I110 Hypertensive heart disease with heart failure: Secondary | ICD-10-CM | POA: Diagnosis not present

## 2021-06-20 DIAGNOSIS — H9190 Unspecified hearing loss, unspecified ear: Secondary | ICD-10-CM

## 2021-06-20 DIAGNOSIS — H9319 Tinnitus, unspecified ear: Secondary | ICD-10-CM

## 2021-06-20 DIAGNOSIS — W19XXXD Unspecified fall, subsequent encounter: Secondary | ICD-10-CM | POA: Diagnosis not present

## 2021-06-20 DIAGNOSIS — I69322 Dysarthria following cerebral infarction: Secondary | ICD-10-CM

## 2021-06-20 DIAGNOSIS — I4891 Unspecified atrial fibrillation: Secondary | ICD-10-CM

## 2021-06-20 DIAGNOSIS — I6200 Nontraumatic subdural hemorrhage, unspecified: Secondary | ICD-10-CM

## 2021-06-20 DIAGNOSIS — Z8701 Personal history of pneumonia (recurrent): Secondary | ICD-10-CM

## 2021-06-20 DIAGNOSIS — I69398 Other sequelae of cerebral infarction: Secondary | ICD-10-CM

## 2021-06-20 DIAGNOSIS — I951 Orthostatic hypotension: Secondary | ICD-10-CM

## 2021-06-20 DIAGNOSIS — J8410 Pulmonary fibrosis, unspecified: Secondary | ICD-10-CM

## 2021-06-20 DIAGNOSIS — Z87891 Personal history of nicotine dependence: Secondary | ICD-10-CM

## 2021-06-20 DIAGNOSIS — E785 Hyperlipidemia, unspecified: Secondary | ICD-10-CM

## 2021-06-20 DIAGNOSIS — Z9181 History of falling: Secondary | ICD-10-CM

## 2021-06-20 DIAGNOSIS — S065X9D Traumatic subdural hemorrhage with loss of consciousness of unspecified duration, subsequent encounter: Secondary | ICD-10-CM | POA: Diagnosis not present

## 2021-06-20 DIAGNOSIS — M21372 Foot drop, left foot: Secondary | ICD-10-CM

## 2021-06-20 DIAGNOSIS — E871 Hypo-osmolality and hyponatremia: Secondary | ICD-10-CM

## 2021-06-20 DIAGNOSIS — Z7951 Long term (current) use of inhaled steroids: Secondary | ICD-10-CM

## 2021-06-23 ENCOUNTER — Other Ambulatory Visit: Payer: Self-pay | Admitting: Cardiology

## 2021-06-23 DIAGNOSIS — I5032 Chronic diastolic (congestive) heart failure: Secondary | ICD-10-CM

## 2021-06-23 DIAGNOSIS — I2510 Atherosclerotic heart disease of native coronary artery without angina pectoris: Secondary | ICD-10-CM

## 2021-06-26 ENCOUNTER — Ambulatory Visit: Payer: Medicare Other | Admitting: Gastroenterology

## 2021-06-26 NOTE — Progress Notes (Addendum)
COVID Vaccine Completed: yes x3  Date of COVID positive in last 90 days:  PCP - Sueanne Margarita, DO Cardiologist - Rex Kras, DO  Chest x-ray - 04/21/21 Epic EKG - 05/31/21 Epic Stress Test -  ECHO - 01/29/21 Epic Cardiac Cath - 1992 Pacemaker/ICD device last checked: Spinal Cord Stimulator:  Bowel Prep -   Sleep Study -  CPAP -   Fasting Blood Sugar -  Checks Blood Sugar _____ times a day  Blood Thinner Instructions: Xarelto Aspirin Instructions: Last Dose:  Activity level:  Can go up a flight of stairs and perform activities of daily living without stopping and without symptoms of chest pain or shortness of breath.   Able to exercise without symptoms  Unable to go up a flight of stairs without symptoms of      Anesthesia review: A fin, HTN, CAD, CABG, CVA  Patient denies shortness of breath, fever, cough and chest pain at PAT appointment   Patient verbalized understanding of instructions that were given to them at the PAT appointment. Patient was also instructed that they will need to review over the PAT instructions again at home before surgery.

## 2021-06-26 NOTE — Patient Instructions (Signed)
DUE TO COVID-19 ONLY TWO VISITORS  (aged 86 and older)  ARE ALLOWED TO COME WITH YOU AND STAY IN THE WAITING ROOM ONLY DURING PRE OP AND PROCEDURE.   **NO VISITORS ARE ALLOWED IN THE SHORT STAY AREA OR RECOVERY ROOM!!**   Your procedure is scheduled on: 07/02/21   Report to [facility] Main Entrance    Report to admitting at 7:45 AM   Call this number if you have problems the morning of surgery [phone]   Do not eat food :After Midnight.   After Midnight you may have the following liquids until 7:00 AM DAY OF SURGERY  Water Black Coffee (sugar ok, NO MILK/CREAM OR CREAMERS)  Tea (sugar ok, NO MILK/CREAM OR CREAMERS) regular and decaf                             Plain Jell-O (NO RED)                                           Fruit ices (not with fruit pulp, NO RED)                                     Popsicles (NO RED)                                                                  Juice: apple, WHITE grape, WHITE cranberry Sports drinks like Gatorade (NO RED) Clear broth(vegetable,chicken,beef)  FOLLOW BOWEL PREP AND ANY ADDITIONAL PRE OP INSTRUCTIONS YOU RECEIVED FROM YOUR SURGEON'S OFFICE!!!     Oral Hygiene is also important to reduce your risk of infection.                                    Remember - BRUSH YOUR TEETH THE MORNING OF SURGERY WITH YOUR REGULAR TOOTHPASTE   Take these medicines the morning of surgery with A SIP OF WATER: Tylenol, Zetia, Pepcid, Omeprazole, Flomax, Torsemide                               You may not have any metal on your body including jewelry, and body piercing             Do not wear lotions, powders, cologne, or deodorant              Men may shave face and neck.   Do not bring valuables to the hospital. [facility] IS NOT             RESPONSIBLE   FOR VALUABLES.   Contacts, dentures or bridgework may not be worn into surgery.    Patients discharged on the day of surgery will not be allowed to drive home.  Someone NEEDS to  stay with you for the first 24 hours after anesthesia.   Special Instructions: Bring a copy of your healthcare power of attorney and living  will documents         the day of surgery if you haven't scanned them before.              Please read over the following fact sheets you were given: IF YOU HAVE QUESTIONS ABOUT YOUR PRE-OP INSTRUCTIONS PLEASE CALL [phone]- [facility] - Preparing for Surgery Before surgery, you can play an important role.  Because skin is not sterile, your skin needs to be as free of germs as possible.  You can reduce the number of germs on your skin by washing with CHG (chlorahexidine gluconate) soap before surgery.  CHG is an antiseptic cleaner which kills germs and bonds with the skin to continue killing germs even after washing. Please DO NOT use if you have an allergy to CHG or antibacterial soaps.  If your skin becomes reddened/irritated stop using the CHG and inform your nurse when you arrive at Short Stay. Do not shave (including legs and underarms) for at least 48 hours prior to the first CHG shower.  You may shave your face/neck.  Please follow these instructions carefully:  1.  Shower with CHG Soap the night before surgery and the  morning of surgery.  2.  If you choose to wash your hair, wash your hair first as usual with your normal  shampoo.  3.  After you shampoo, rinse your hair and body thoroughly to remove the shampoo.                             4.  Use CHG as you would any other liquid soap.  You can apply chg directly to the skin and wash.  Gently with a scrungie or clean washcloth.  5.  Apply the CHG Soap to your body ONLY FROM THE NECK DOWN.   Do   not use on face/ open                           Wound or open sores. Avoid contact with eyes, ears mouth and   genitals (private parts).                       Wash face,  Genitals (private parts) with your normal soap.             6.  Wash thoroughly, paying special attention to the area where  your    surgery  will be performed.  7.  Thoroughly rinse your body with warm water from the neck down.  8.  DO NOT shower/wash with your normal soap after using and rinsing off the CHG Soap.                9.  Pat yourself dry with a clean towel.            10.  Wear clean pajamas.            11.  Place clean sheets on your bed the night of your first shower and do not  sleep with pets. Day of Surgery : Do not apply any lotions/deodorants the morning of surgery.  Please wear clean clothes to the hospital/surgery center.  FAILURE TO FOLLOW THESE INSTRUCTIONS MAY RESULT IN THE CANCELLATION OF YOUR SURGERY  PATIENT SIGNATURE_________________________________  NURSE SIGNATURE__________________________________  ________________________________________________________________________ ________________________________________________________________________

## 2021-06-27 ENCOUNTER — Encounter (HOSPITAL_COMMUNITY)
Admission: RE | Admit: 2021-06-27 | Discharge: 2021-06-27 | Disposition: A | Discharge status: Home / Self Care | Payer: Medicare Other | Source: Ambulatory Visit | Attending: Urology | Admitting: Urology

## 2021-06-27 ENCOUNTER — Encounter (HOSPITAL_COMMUNITY): Payer: Self-pay

## 2021-06-27 VITALS — BP 109/56 | HR 95 | Resp 14 | Ht 71.0 in | Wt 172.8 lb

## 2021-06-27 DIAGNOSIS — Z01812 Encounter for preprocedural laboratory examination: Secondary | ICD-10-CM | POA: Diagnosis not present

## 2021-06-27 DIAGNOSIS — I4891 Unspecified atrial fibrillation: Secondary | ICD-10-CM | POA: Diagnosis not present

## 2021-06-27 DIAGNOSIS — Z8673 Personal history of transient ischemic attack (TIA), and cerebral infarction without residual deficits: Secondary | ICD-10-CM | POA: Diagnosis not present

## 2021-06-27 DIAGNOSIS — Z87891 Personal history of nicotine dependence: Secondary | ICD-10-CM | POA: Diagnosis not present

## 2021-06-27 DIAGNOSIS — Z951 Presence of aortocoronary bypass graft: Secondary | ICD-10-CM | POA: Diagnosis not present

## 2021-06-27 DIAGNOSIS — I2510 Atherosclerotic heart disease of native coronary artery without angina pectoris: Secondary | ICD-10-CM | POA: Insufficient documentation

## 2021-06-27 DIAGNOSIS — N201 Calculus of ureter: Secondary | ICD-10-CM | POA: Diagnosis not present

## 2021-06-27 DIAGNOSIS — I10 Essential (primary) hypertension: Secondary | ICD-10-CM | POA: Insufficient documentation

## 2021-06-27 HISTORY — DX: Pneumonia, unspecified organism: J18.9

## 2021-06-27 LAB — BASIC METABOLIC PANEL
Anion gap: 8 (ref 5–15)
BUN: 18 mg/dL (ref 8–23)
CO2: 29 mmol/L (ref 22–32)
Calcium: 9.5 mg/dL (ref 8.9–10.3)
Chloride: 105 mmol/L (ref 98–111)
Creatinine, Ser: 0.92 mg/dL (ref 0.61–1.24)
GFR, Estimated: >60 mL/min (ref >60)
Glucose, Bld: 97 mg/dL (ref 70–99)
Potassium: 4.3 mmol/L (ref 3.5–5.1)
Sodium: 142 mmol/L (ref 135–145)

## 2021-06-27 LAB — CBC
HCT: 38 % — ABNORMAL LOW (ref 39.0–52.0)
Hemoglobin: 11.9 g/dL — ABNORMAL LOW (ref 13.0–17.0)
MCH: 29.3 pg (ref 26.0–34.0)
MCHC: 31.3 g/dL (ref 30.0–36.0)
MCV: 93.6 fL (ref 80.0–100.0)
Platelets: 227 10*3/uL (ref 150–400)
RBC: 4.06 MIL/uL — ABNORMAL LOW (ref 4.22–5.81)
RDW: 13.1 % (ref 11.5–15.5)
WBC: 8 10*3/uL (ref 4.0–10.5)
nRBC: 0 % (ref 0.0–0.2)

## 2021-06-28 NOTE — Progress Notes (Addendum)
Anesthesia Chart Review   Case: [id number] Date/Time: 07/02/21 0945   Procedure: LEFT URETEROSCOPY/HOLMIUM LASER (Left)   Anesthesia type: General   Pre-op diagnosis: LEFT URETERAL STONE   Location: [facility] PROCEDURE [facility] / [facility]   Surgeons: Vira Agar, MD       DISCUSSION:88 y.o. former smoker with h/o HTN, atrial fibrillation (on Xarelto), CVA 2015, CAD (CABG 1992), subdural hematoma s/p left middle meningeal artery embolization (April 2023), left ureteral stone scheduled for above procedure 07/02/2021 with Dr. Terrilee Files.   Last seen by cardiology 05/31/2021, stable at this visit.  With 6 month follow up recommended.   S/p left sided middle meningeal artery embolization 05/03/2021. He has followed with neurology since, stable. Evaluate DOS.   VS: BP (!) 109/56   Pulse 95   Resp 14   Ht 5\' 11"  (1.803 m)   Wt 78.4 kg   SpO2 97%   BMI 24.10 kg/m   PROVIDERS: Sueanne Margarita, DO is PCP   Cardiologist - Rex Kras, DO  LABS: Labs reviewed: Acceptable for surgery. (all labs ordered are listed, but only abnormal results are displayed)  Labs Reviewed  CBC - Abnormal; Notable for the following components:      Result Value   RBC 4.06 (*)    Hemoglobin 11.9 (*)    HCT 38.0 (*)    All other components within normal limits  BASIC METABOLIC PANEL     IMAGES: Carotid artery duplex 05/16/2021:  Duplex suggests stenosis in the right internal carotid artery (16-49%).  Duplex suggests stenosis in the right external carotid artery (<50%).  Duplex suggests stenosis in the left internal carotid artery (50-69%).  Duplex suggests stenosis in the left external carotid artery (<50%).  Antegrade right vertebral artery flow. Antegrade left vertebral artery  flow.  Significant change since 10/23/2020. Follow up in six months is appropriate  if clinically indicated.  EKG: 05/31/2021: Atrial fibrillation, 84 bpm, without underlying injury pattern.   CV: Echocardiogram 01/29/2021:  Left  ventricle cavity is normal in size. Normal left ventricular wall  thickness. Normal global wall motion. Normal LV systolic function with  visual EF 55%. Unable to evaluate diastolic function due to atrial  fibrillation.  Left atrial cavity is severely dilated.  Structurally normal trileaflet aortic valve. No evidence of aortic  stenosis. Moderate (Grade II) aortic regurgitation.  Mild (Grade I) mitral regurgitation.  Moderate tricuspid regurgitation. Estimated pulmonary artery systolic  pressure 50 mmHg.  Mild to moderate pulmonic regurgitation.  Echo 04/16/2020 1. Inferior basal hypokinesis. Left ventricular ejection fraction, by  estimation, is 55 to 60%. The left ventricle has normal function. The left  ventricle has no regional wall motion abnormalities. The left ventricular  internal cavity size was mildly  dilated. There is mild left ventricular hypertrophy. Left ventricular  diastolic parameters were normal.   2. Right ventricular systolic function is normal. The right ventricular  size is normal.   3. Left atrial size was severely dilated.   4. The mitral valve is normal in structure. Mild mitral valve  regurgitation. No evidence of mitral stenosis.   5. Tricuspid valve regurgitation is moderate.   6. 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 The aortic  valve is abnormal. Aortic valve regurgitation is mild. Mild to moderate  aortic valve sclerosis/calcification is present, without any evidence of  aortic stenosis.   7. Aortic dilatation noted. There is mild dilatation of the ascending  aorta, measuring 39 mm.   8. The inferior vena  cava is normal in size with greater than 50%  respiratory variability, suggesting right atrial pressure of 3 mmHg.  Past Medical History:  Diagnosis Date   Atrial fibrillation ([place])    CAD (coronary artery disease)    CVA (cerebral vascular accident) ([place]) 05/27/2013   GI bleed 05/27/2013   History of blood transfusion    GI  Bleed   History of kidney stones    surgery to remove   HTN (hypertension)    Hx of CABG    Possibly 2015?   Hyperlipidemia    Pneumonia     Past Surgical History:  Procedure Laterality Date   CORONARY ARTERY BYPASS GRAFT  1992   ESOPHAGOGASTRODUODENOSCOPY     IR ANGIO EXTERNAL CAROTID SEL EXT CAROTID UNI L MOD SED  05/03/2021   IR ANGIO INTRA EXTRACRAN SEL INTERNAL CAROTID UNI L MOD SED  05/03/2021   IR ANGIOGRAM FOLLOW UP STUDY  05/03/2021   IR NEURO EACH ADD'L AFTER BASIC UNI LEFT (MS)  05/03/2021   IR TRANSCATH/EMBOLIZ  05/03/2021   RADIOLOGY WITH ANESTHESIA N/A 05/03/2021   Procedure: IR WITH ANESTHESIA;  Surgeon: Consuella Lose, MD;  Location: [place];  Service: Radiology;  Laterality: N/A;   TONSILLECTOMY AND ADENOIDECTOMY      MEDICATIONS:  acetaminophen (TYLENOL) 325 MG tablet   AMBULATORY NON FORMULARY MEDICATION   BREO ELLIPTA 100-25 MCG/ACT AEPB   Cholecalciferol (VITAMIN D) 125 MCG (5000 UT) CAPS   diltiazem 2 % GEL   ezetimibe (ZETIA) 10 MG tablet   famotidine (PEPCID) 20 MG tablet   ibandronate (BONIVA) 150 MG tablet   linaclotide (LINZESS) 145 MCG CAPS capsule   lisinopril (ZESTRIL) 20 MG tablet   Melatonin 10 MG TABS   Multiple Vitamin (MULTIVITAMIN WITH MINERALS) TABS tablet   omeprazole (PRILOSEC) 20 MG capsule   ondansetron (ZOFRAN) 4 MG tablet   oxyCODONE-acetaminophen (PERCOCET/ROXICET) 5-325 MG tablet   Potassium 99 MG TABS   rivaroxaban (XARELTO) 20 MG TABS tablet   rosuvastatin (CRESTOR) 20 MG tablet   tamsulosin (FLOMAX) 0.4 MG CAPS capsule   torsemide (DEMADEX) 5 MG tablet   No current facility-administered medications for this encounter.    Konrad Felix Ward, PA-C [facility] [phone]

## 2021-06-29 ENCOUNTER — Other Ambulatory Visit (HOSPITAL_COMMUNITY): Payer: Self-pay | Admitting: Neurological Surgery

## 2021-06-29 ENCOUNTER — Other Ambulatory Visit: Payer: Self-pay | Admitting: Neurological Surgery

## 2021-06-29 DIAGNOSIS — I6203 Nontraumatic chronic subdural hemorrhage: Secondary | ICD-10-CM

## 2021-07-02 ENCOUNTER — Ambulatory Visit (HOSPITAL_COMMUNITY): Payer: Medicare Other | Admitting: Physician Assistant

## 2021-07-02 ENCOUNTER — Ambulatory Visit (HOSPITAL_COMMUNITY): Payer: Medicare Other

## 2021-07-02 ENCOUNTER — Ambulatory Visit (HOSPITAL_BASED_OUTPATIENT_CLINIC_OR_DEPARTMENT_OTHER): Payer: Medicare Other | Admitting: Anesthesiology

## 2021-07-02 ENCOUNTER — Encounter (HOSPITAL_COMMUNITY): Payer: Self-pay | Admitting: Urology

## 2021-07-02 ENCOUNTER — Ambulatory Visit (HOSPITAL_COMMUNITY)
Admission: RE | Admit: 2021-07-02 | Discharge: 2021-07-02 | Disposition: A | Discharge status: Home / Self Care | Payer: Medicare Other | Source: Ambulatory Visit | Attending: Urology | Admitting: Urology

## 2021-07-02 ENCOUNTER — Encounter (HOSPITAL_COMMUNITY)
Admission: RE | Disposition: A | Discharge status: Home / Self Care | Payer: Self-pay | Source: Ambulatory Visit | Attending: Urology

## 2021-07-02 DIAGNOSIS — J449 Chronic obstructive pulmonary disease, unspecified: Secondary | ICD-10-CM | POA: Insufficient documentation

## 2021-07-02 DIAGNOSIS — Z951 Presence of aortocoronary bypass graft: Secondary | ICD-10-CM | POA: Diagnosis not present

## 2021-07-02 DIAGNOSIS — I739 Peripheral vascular disease, unspecified: Secondary | ICD-10-CM | POA: Insufficient documentation

## 2021-07-02 DIAGNOSIS — N189 Chronic kidney disease, unspecified: Secondary | ICD-10-CM | POA: Diagnosis not present

## 2021-07-02 DIAGNOSIS — I10 Essential (primary) hypertension: Secondary | ICD-10-CM

## 2021-07-02 DIAGNOSIS — Z8673 Personal history of transient ischemic attack (TIA), and cerebral infarction without residual deficits: Secondary | ICD-10-CM | POA: Insufficient documentation

## 2021-07-02 DIAGNOSIS — I2510 Atherosclerotic heart disease of native coronary artery without angina pectoris: Secondary | ICD-10-CM

## 2021-07-02 DIAGNOSIS — Z87891 Personal history of nicotine dependence: Secondary | ICD-10-CM | POA: Insufficient documentation

## 2021-07-02 DIAGNOSIS — K219 Gastro-esophageal reflux disease without esophagitis: Secondary | ICD-10-CM | POA: Diagnosis not present

## 2021-07-02 DIAGNOSIS — N132 Hydronephrosis with renal and ureteral calculous obstruction: Secondary | ICD-10-CM

## 2021-07-02 DIAGNOSIS — I129 Hypertensive chronic kidney disease with stage 1 through stage 4 chronic kidney disease, or unspecified chronic kidney disease: Secondary | ICD-10-CM | POA: Insufficient documentation

## 2021-07-02 DIAGNOSIS — Z79899 Other long term (current) drug therapy: Secondary | ICD-10-CM | POA: Insufficient documentation

## 2021-07-02 DIAGNOSIS — I4891 Unspecified atrial fibrillation: Secondary | ICD-10-CM | POA: Insufficient documentation

## 2021-07-02 HISTORY — PX: CYSTOSCOPY/URETEROSCOPY/HOLMIUM LASER/STENT PLACEMENT: SHX6546

## 2021-07-02 SURGERY — CYSTOSCOPY/URETEROSCOPY/HOLMIUM LASER/STENT PLACEMENT
Anesthesia: General | Site: Ureter | Laterality: Left

## 2021-07-02 MED ORDER — PHENYLEPHRINE 80 MCG/ML (10ML) SYRINGE FOR IV PUSH (FOR BLOOD PRESSURE SUPPORT)
PREFILLED_SYRINGE | INTRAVENOUS | Status: DC | PRN
  Administered 2021-07-02 (×4): 160 ug via INTRAVENOUS

## 2021-07-02 MED ORDER — LIDOCAINE 2% (20 MG/ML) 5 ML SYRINGE
INTRAMUSCULAR | Status: DC | PRN
  Administered 2021-07-02: 60 mg via INTRAVENOUS

## 2021-07-02 MED ORDER — MELOXICAM 7.5 MG PO TABS: 7.5000 mg | ORAL_TABLET | Freq: Two times a day (BID) | ORAL | 0 refills | Status: DC | PRN

## 2021-07-02 MED ORDER — SODIUM CHLORIDE 0.9 % IR SOLN
Status: DC | PRN
  Administered 2021-07-02: 1000 mL

## 2021-07-02 MED ORDER — SODIUM CHLORIDE 0.9 % IR SOLN
Status: DC | PRN
  Administered 2021-07-02: 6000 mL

## 2021-07-02 MED ORDER — CHLORHEXIDINE GLUCONATE 0.12 % MT SOLN
15.0000 mL | Freq: Once | OROMUCOSAL | Status: AC
  Administered 2021-07-02: 15 mL via OROMUCOSAL

## 2021-07-02 MED ORDER — FENTANYL CITRATE (PF) 100 MCG/2ML IJ SOLN
INTRAMUSCULAR | Status: AC
  Filled 2021-07-02: qty 2

## 2021-07-02 MED ORDER — IOHEXOL 300 MG/ML  SOLN
INTRAMUSCULAR | Status: DC | PRN
  Administered 2021-07-02: 5 mL

## 2021-07-02 MED ORDER — PROPOFOL 10 MG/ML IV BOLUS
INTRAVENOUS | Status: AC
  Filled 2021-07-02: qty 20

## 2021-07-02 MED ORDER — NITROFURANTOIN MONOHYD MACRO 100 MG PO CAPS: 100.0000 mg | ORAL_CAPSULE | Freq: Two times a day (BID) | ORAL | 0 refills | Status: AC

## 2021-07-02 MED ORDER — ONDANSETRON HCL 4 MG/2ML IJ SOLN
INTRAMUSCULAR | Status: DC | PRN
  Administered 2021-07-02: 4 mg via INTRAVENOUS

## 2021-07-02 MED ORDER — LIDOCAINE HCL (PF) 2 % IJ SOLN
INTRAMUSCULAR | Status: AC
  Filled 2021-07-02: qty 5

## 2021-07-02 MED ORDER — ONDANSETRON HCL 4 MG/2ML IJ SOLN
INTRAMUSCULAR | Status: AC
Start: 2021-07-02 — End: ?
  Filled 2021-07-02: qty 2

## 2021-07-02 MED ORDER — PROPOFOL 10 MG/ML IV BOLUS
INTRAVENOUS | Status: DC | PRN
  Administered 2021-07-02: 160 mg via INTRAVENOUS

## 2021-07-02 MED ORDER — CEFAZOLIN SODIUM-DEXTROSE 2-4 GM/100ML-% IV SOLN
2.0000 g | INTRAVENOUS | Status: AC
  Administered 2021-07-02: 2 g via INTRAVENOUS
  Filled 2021-07-02: qty 100

## 2021-07-02 MED ORDER — OXYCODONE-ACETAMINOPHEN 5-325 MG PO TABS: 1.0000 | ORAL_TABLET | Freq: Four times a day (QID) | ORAL | 0 refills | Status: DC | PRN

## 2021-07-02 MED ORDER — FENTANYL CITRATE (PF) 100 MCG/2ML IJ SOLN
INTRAMUSCULAR | Status: DC | PRN
  Administered 2021-07-02: 25 ug via INTRAVENOUS
  Administered 2021-07-02: 50 ug via INTRAVENOUS
  Administered 2021-07-02: 25 ug via INTRAVENOUS

## 2021-07-02 MED ORDER — PHENYLEPHRINE 80 MCG/ML (10ML) SYRINGE FOR IV PUSH (FOR BLOOD PRESSURE SUPPORT)
PREFILLED_SYRINGE | INTRAVENOUS | Status: AC
Start: 2021-07-02 — End: ?
  Filled 2021-07-02: qty 10

## 2021-07-02 MED ORDER — EPHEDRINE SULFATE-NACL 50-0.9 MG/10ML-% IV SOSY
PREFILLED_SYRINGE | INTRAVENOUS | Status: DC | PRN
  Administered 2021-07-02 (×2): 5 mg via INTRAVENOUS

## 2021-07-02 MED ORDER — DEXAMETHASONE SODIUM PHOSPHATE 10 MG/ML IJ SOLN
INTRAMUSCULAR | Status: DC | PRN
  Administered 2021-07-02: 10 mg via INTRAVENOUS

## 2021-07-02 MED ORDER — ORAL CARE MOUTH RINSE: 15.0000 mL | Freq: Once | OROMUCOSAL | Status: AC

## 2021-07-02 MED ORDER — LACTATED RINGERS IV SOLN: INTRAVENOUS | Status: DC

## 2021-07-02 MED ORDER — EPHEDRINE 5 MG/ML INJ
INTRAVENOUS | Status: AC
  Filled 2021-07-02: qty 5

## 2021-07-02 MED ORDER — DEXAMETHASONE SODIUM PHOSPHATE 10 MG/ML IJ SOLN
INTRAMUSCULAR | Status: AC
  Filled 2021-07-02: qty 1

## 2021-07-02 SURGICAL SUPPLY — 27 items
BAG COUNTER SPONGE SURGICOUNT (BAG) IMPLANT
BAG URO CATCHER STRL LF (MISCELLANEOUS) ×2 IMPLANT
BASKET ZERO TIP NITINOL 2.4FR (BASKET) ×1 IMPLANT
CATH URETERAL DUAL LUMEN 10F (MISCELLANEOUS) ×2 IMPLANT
CATH URETL OPEN END 6FR 70 (CATHETERS) ×1 IMPLANT
CLOTH BEACON ORANGE TIMEOUT ST (SAFETY) ×2 IMPLANT
EXTRACTOR STONE NITINOL NGAGE (UROLOGICAL SUPPLIES) ×1 IMPLANT
GLOVE SS BIOGEL STRL SZ 7 (GLOVE) ×1 IMPLANT
GLOVE SUPERSENSE BIOGEL SZ 7 (GLOVE) ×1
GLOVE SURG LX 7.5 STRW (GLOVE) ×1
GLOVE SURG LX STRL 7.5 STRW (GLOVE) ×1 IMPLANT
GOWN STRL REUS W/ TWL XL LVL3 (GOWN DISPOSABLE) ×1 IMPLANT
GOWN STRL REUS W/TWL XL LVL3 (GOWN DISPOSABLE) ×1
GUIDEWIRE STR DUAL SENSOR (WIRE) ×2 IMPLANT
GUIDEWIRE ZIPWRE .038 STRAIGHT (WIRE) ×2 IMPLANT
IV NS 1000ML (IV SOLUTION) ×1
IV NS 1000ML BAXH (IV SOLUTION) ×1 IMPLANT
KIT TURNOVER KIT A (KITS) IMPLANT
LASER FIB FLEXIVA PULSE ID 365 (Laser) IMPLANT
MANIFOLD NEPTUNE II (INSTRUMENTS) ×2 IMPLANT
PACK CYSTO (CUSTOM PROCEDURE TRAY) ×2 IMPLANT
SHEATH NAVIGATOR HD 11/13X36 (SHEATH) IMPLANT
STENT URET 6FRX26 CONTOUR (STENTS) ×1 IMPLANT
TRACTIP FLEXIVA PULS ID 200XHI (Laser) IMPLANT
TRACTIP FLEXIVA PULSE ID 200 (Laser) ×1
TUBING CONNECTING 10 (TUBING) ×2 IMPLANT
TUBING UROLOGY SET (TUBING) ×2 IMPLANT

## 2021-07-02 NOTE — Anesthesia Postprocedure Evaluation (Signed)
Anesthesia Post Note  Patient: Calvin Brown  Procedure(s) Performed: LEFT URETEROSCOPY/HOLMIUM LASER retrograde pylegram left stent placement (Left: Ureter)     Patient location during evaluation: PACU Anesthesia Type: General Level of consciousness: sedated Pain management: pain level controlled Vital Signs Assessment: post-procedure vital signs reviewed and stable Respiratory status: spontaneous breathing and respiratory function stable Cardiovascular status: stable Postop Assessment: no apparent nausea or vomiting Anesthetic complications: no   No notable events documented.  Last Vitals:  Vitals:   07/02/21 1210 07/02/21 1215  BP:  (!) 101/55  Pulse: 84 89  Resp: 19 15  Temp:    SpO2: 98% 94%    Last Pain:  Vitals:   07/02/21 1215  TempSrc:   PainSc: 0-No pain                 Jaquin Coy DANIEL

## 2021-07-02 NOTE — Anesthesia Procedure Notes (Signed)
Procedure Name: LMA Insertion Date/Time: 07/02/2021 10:02 AM Performed by: Prima Rayner D, CRNA Pre-anesthesia Checklist: Patient identified, Emergency Drugs available, Suction available and Patient being monitored Patient Re-evaluated:Patient Re-evaluated prior to induction Oxygen Delivery Method: Circle system utilized Preoxygenation: Pre-oxygenation with 100% oxygen Induction Type: IV induction Ventilation: Mask ventilation without difficulty LMA: LMA inserted LMA Size: 5.0 Tube type: Oral Number of attempts: 1 Placement Confirmation: positive ETCO2 and breath sounds checked- equal and bilateral Tube secured with: Tape Dental Injury: Teeth and Oropharynx as per pre-operative assessment

## 2021-07-02 NOTE — H&P (Signed)
H&P  History of Present Illness: Calvin Brown is a 88 y.o. year old who presents today for left ureteroscopy for 2 distal ureteral stones  Past Medical History:  Diagnosis Date   Atrial fibrillation ([facility])    CAD (coronary artery disease)    CVA (cerebral vascular accident) ([facility]) 05/27/2013   GI bleed 05/27/2013   History of blood transfusion    GI Bleed   History of kidney stones    surgery to remove   HTN (hypertension)    Hx of CABG    Possibly 2015?   Hyperlipidemia    Pneumonia     Past Surgical History:  Procedure Laterality Date   CORONARY ARTERY BYPASS GRAFT  1992   ESOPHAGOGASTRODUODENOSCOPY     IR ANGIO EXTERNAL CAROTID SEL EXT CAROTID UNI L MOD SED  05/03/2021   IR ANGIO INTRA EXTRACRAN SEL INTERNAL CAROTID UNI L MOD SED  05/03/2021   IR ANGIOGRAM FOLLOW UP STUDY  05/03/2021   IR NEURO EACH ADD'L AFTER BASIC UNI LEFT (MS)  05/03/2021   IR TRANSCATH/EMBOLIZ  05/03/2021   RADIOLOGY WITH ANESTHESIA N/A 05/03/2021   Procedure: IR WITH ANESTHESIA;  Surgeon: Lisbeth Renshaw, MD;  Location: [facility] OR;  Service: Radiology;  Laterality: N/A;   TONSILLECTOMY AND ADENOIDECTOMY      Home Medications:  Current Meds  Medication Sig   Cholecalciferol (VITAMIN D) 125 MCG (5000 UT) CAPS Take 5,000 Units by mouth in the morning.   ezetimibe (ZETIA) 10 MG tablet Take 10 mg by mouth daily.   famotidine (PEPCID) 20 MG tablet Take 20 mg by mouth every 8 (eight) hours.   ibandronate (BONIVA) 150 MG tablet Take 150 mg by mouth every 30 (thirty) days.   linaclotide (LINZESS) 145 MCG CAPS capsule Take 1 capsule (145 mcg total) by mouth daily before breakfast.   lisinopril (ZESTRIL) 20 MG tablet TAKE 1 TABLET BY MOUTH EVERY DAY IN THE MORNING   Melatonin 10 MG TABS Take 10 mg by mouth at bedtime.   Multiple Vitamin (MULTIVITAMIN WITH MINERALS) TABS tablet Take 1 tablet by mouth in the morning.   omeprazole (PRILOSEC) 20 MG capsule Take 1 capsule (20 mg total) by mouth daily.   Potassium 99 MG TABS  Take 198 mg by mouth in the morning.   rivaroxaban (XARELTO) 20 MG TABS tablet Take 20 mg by mouth daily with supper.   rosuvastatin (CRESTOR) 20 MG tablet Take 1 tablet (20 mg total) by mouth at bedtime for 90 doses.   tamsulosin (FLOMAX) 0.4 MG CAPS capsule Take 2 capsules (0.8 mg total) by mouth daily. (Patient taking differently: Take 0.4 mg by mouth in the morning and at bedtime.)   torsemide (DEMADEX) 5 MG tablet Take 5 mg by mouth in the morning.   [DISCONTINUED] lisinopril (ZESTRIL) 20 MG tablet Take 1 tablet (20 mg total) by mouth every morning. (Patient taking differently: Take 20 mg by mouth daily as needed (for systolic blood pressure greater than 120.).)    Allergies: No Known Allergies  Family History  Problem Relation Age of Onset   Vision loss Mother    Breast cancer Mother 55   Hypertension Father    Heart disease Sister    Heart attack Sister 13    Social History:  reports that he quit smoking about 54 years ago. His smoking use included cigarettes. He started smoking about 66 years ago. He has a 24.00 pack-year smoking history. He has never used smokeless tobacco. He reports that he does not  currently use alcohol. He reports that he does not use drugs.  ROS: A complete review of systems was performed.  All systems are negative except for pertinent findings as noted.  Physical Exam:  Vital signs in last 24 hours: Temp:  [97.7 F (36.5 C)] 97.7 F (36.5 C) (06/06 0821) Pulse Rate:  [70] 70 (06/06 0821) Resp:  [18] 18 (06/06 0821) BP: (129)/(74) 129/74 (06/06 0821) SpO2:  [95 %] 95 % (06/06 0821) Weight:  [78.4 kg] 78.4 kg (06/06 0853) Constitutional:  Alert and oriented, No acute distress Cardiovascular: Regular rate and rhythm, No JVD Respiratory: Normal respiratory effort, Lungs clear bilaterally GI: Abdomen is soft, nontender, nondistended, no abdominal masses GU: No CVA tenderness Lymphatic: No lymphadenopathy Neurologic: Grossly intact, no focal  deficits Psychiatric: Normal mood and affect Drains/Tubes: none   Laboratory Data:  No results for input(s): WBC, HGB, HCT, PLT in the last 72 hours.  No results for input(s): NA, K, CL, GLUCOSE, BUN, CALCIUM, CREATININE in the last 72 hours.  Invalid input(s): CO3   No results found for this or any previous visit (from the past 24 hour(s)). No results found for this or any previous visit (from the past 240 hour(s)).  Renal Function: Recent Labs    06/27/21 1324  CREATININE 0.92   Estimated Creatinine Clearance: 61.4 mL/min (by C-G formula based on SCr of 0.92 mg/dL).  Radiologic Imaging: No results found.  Assessment:  88 yo M with left distal ureteral stones  Plan:  To OR for ureteroscopy with laser litho, basket extraction of stones, stent placement  Irine Seal, MD 07/02/2021, 9:11 AM  [facility] Pager: [phone]

## 2021-07-02 NOTE — Discharge Instructions (Signed)

## 2021-07-02 NOTE — Anesthesia Preprocedure Evaluation (Addendum)
Anesthesia Evaluation  Patient identified by MRN, date of birth, ID band Patient awake    Reviewed: Allergy & Precautions, NPO status , Patient's Chart, lab work & pertinent test results  History of Anesthesia Complications Negative for: history of anesthetic complications  Airway Mallampati: IV  TM Distance: >3 FB Neck ROM: Full    Dental  (+) Caps, Dental Advisory Given   Pulmonary COPD,  COPD inhaler, former smoker,  Quit smoking 1969, 24 pack year history    Pulmonary exam normal        Cardiovascular hypertension, Pt. on medications + CAD, + CABG (1992) and + Peripheral Vascular Disease  Normal cardiovascular exam+ dysrhythmias Atrial Fibrillation + Valvular Problems/Murmurs (mild MR) MR  Rhythm:Irregular Rate:Normal  Echo 01/2021  1. Left ventricle cavity is normal in size. Normal left ventricular wall thickness. Normal global wall motion. Normal LV systolic function with visual EF 55%. Unable to evaluate diastolic function due to atrial fibrillation. 2. Left atrial cavity is severely dilated. 3. Structurally normal trileaflet aortic valve. No evidence of aortic stenosis. Moderate (Grade II) aortic regurgitation. 4. Mild (Grade I) mitral regurgitation. 5. Moderate tricuspid regurgitation. Estimated pulmonary artery systolic pressure 50 mmHg. 6. Mild to moderate pulmonic regurgitation.  Echo 03/2020 1. Inferior basal hypokinesis. Left ventricular ejection fraction, by  estimation, is 55 to 60%. The left ventricle has normal function. The left  ventricle has no regional wall motion abnormalities. The left ventricular  internal cavity size was mildly  dilated. There is mild left ventricular hypertrophy. Left ventricular  diastolic parameters were normal.  2. Right ventricular systolic function is normal. The right ventricular  size is normal.  3. Left atrial size was severely dilated.  4. The mitral valve is normal  in structure. Mild mitral valve  regurgitation. No evidence of mitral stenosis.  5. Tricuspid valve regurgitation is moderate.  6. 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 The aortic  valve is abnormal. Aortic valve regurgitation is mild. Mild to moderate  aortic valve sclerosis/calcification is present, without any evidence of  aortic stenosis.  7. Aortic dilatation noted. There is mild dilatation of the ascending  aorta, measuring 39 mm.  8. The inferior vena cava is normal in size with greater than 50%  respiratory variability, suggesting right atrial pressure of 3 mmHg.    Neuro/Psych Subdural hematoma- slowly enlarging but asymptomatic  CVA (2015) negative psych ROS   GI/Hepatic Neg liver ROS, GERD  Medicated and Controlled,  Endo/Other  negative endocrine ROS  Renal/GU Renal InsufficiencyRenal diseaseCr 1.26  negative genitourinary   Musculoskeletal negative musculoskeletal ROS (+)   Abdominal   Peds  Hematology negative hematology ROS (+) hct 35.1    Anesthesia Other Findings   Reproductive/Obstetrics negative OB ROS                          Anesthesia Physical  Anesthesia Plan  ASA: 3  Anesthesia Plan: General   Post-op Pain Management:    Induction: Intravenous  PONV Risk Score and Plan: 2 and Ondansetron, Dexamethasone and Treatment may vary due to age or medical condition  Airway Management Planned: LMA  Additional Equipment:   Intra-op Plan:   Post-operative Plan: Extubation in OR  Informed Consent: I have reviewed the patients History and Physical, chart, labs and discussed the procedure including the risks, benefits and alternatives for the proposed anesthesia with the patient or authorized representative who has indicated his/her understanding and acceptance.  Dental advisory given  Plan Discussed with: Anesthesiologist and CRNA  Anesthesia Plan Comments:        Anesthesia Quick  Evaluation

## 2021-07-02 NOTE — Transfer of Care (Signed)
Immediate Anesthesia Transfer of Care Note  Patient: [name]  Procedure(s) Performed: LEFT URETEROSCOPY/HOLMIUM LASER retrograde pylegram left stent placement (Left: Ureter)  Patient Location: PACU  Anesthesia Type:General  Level of Consciousness: awake, alert  and oriented  Airway & Oxygen Therapy: Patient Spontanous Breathing and Patient connected to face mask oxygen  Post-op Assessment: Report given to RN and Post -op Vital signs reviewed and stable  Post vital signs: Reviewed and stable  Last Vitals:  Vitals Value Taken Time  BP 125/66 07/02/21 1139  Temp    Pulse 86 07/02/21 1141  Resp    SpO2 95 % 07/02/21 1141  Vitals shown include unvalidated device data.  Last Pain:  Vitals:   07/02/21 0851  TempSrc:   PainSc: 5       Patients Stated Pain Goal: 4 (07/02/21 0851)  Complications: No notable events documented.

## 2021-07-02 NOTE — Op Note (Signed)
Operative Note  Preoperative diagnosis:  1.  Two left distal stones 2. Left hydronephrosis secondary to stones  Postoperative diagnosis: 1.  same  Procedure(s): 1.  Left ureteroscopy with laser lithotripsy and basket extraction of stones 2. Cystoscopy  3. Left retrograde pyelogram 4. Left ureteral stent placement (6x26 cm no string) 5. Fluoroscopy with intraoperative interpretation  Surgeon: Donald Pore, MD  Assistants:  None  Anesthesia:  General  Complications:  None  EBL:  Minimal  Specimens: 1. stones for stone analysis (to be done at [facility])  Drains/Catheters: 1.  Left 6Fr x 26cm ureteral stent without tether string  Intraoperative findings:   Cystoscopy demonstrated enlarged prostate with small median lobe Ureteroscopy demonstrated two distal ureteral stones Successful stent placement.  Indication:  Calvin Brown is a 88 y.o. male with two left distal ureteral stones with hydronephrosis  Description of procedure: After informed consent was obtained from the patient, the patient was identified and taken to the operating room and placed in the supine position.  General anesthesia was administered as well as perioperative IV antibiotics.  At the beginning of the case, a time-out was performed to properly identify the patient, the surgery to be performed, and the surgical site.  Sequential compression devices were applied to the lower extremities at the beginning of the case for DVT prophylaxis.  The patient was then placed in the dorsal lithotomy supine position, prepped and draped in sterile fashion.  We then passed the 21-French rigid cystoscope through the urethra and into the bladder under vision without any difficulty , noting a normal urethra without strictures and an enlarged prostate with a median lobe.  A systematic evaluation of the bladder revealed no evidence of any suspicious bladder lesions, there were mild/moderate trabeculations.  Ureteral orifices  were in normal position.    Under cystoscopic and flouroscopic guidance, we cannulated the left ureteral orifice with a 5-French open-ended ureteral catheter and a gentle retrograde pyelogram was performed, revealing a normal caliber ureter without any filling defects. There was  hydronephrosis of the collecting system.  A 0.038 glide wire was then passed up to the level of the renal pelvis and secured to the drape as a safety wire. The ureteral catheter and cystoscope were removed, leaving the safety wire in place.   A semi-rigid ureteroscope was passed alongside the wire up the distal ureter. The stone was identified and was somewhat impacted in the distal ureter.It was fragmented with the holmium laser. The fragments were removed with the zero tip basket under direct visualization. The second stone was a few CM proximal to this stone; it was fragmented with the laser and removed. The semi rigid was advanced up to the UPJ and no further stone fragments were identified. I inspected the ureter on the way out and no further stone fragments were seen. There were  The semi-rigid ureteroscope was removed. the Glidewire was backloaded through the rigid cystoscope, which was then advanced down the urethra and into the bladder. We then used the Glidewire under direct vision through the rigid cystoscope and under fluoroscopic guidance and passed up a 6-French, 26 cm double-pigtail ureteral stent up ureter, making sure that the proximal and distal ends coiled within the kidney and bladder respectively.  The cystoscope was then advanced back into the bladder under vision.  We were able to see the distal stent coiling nicely within the bladder.  The bladder was then emptied with irrigation solution.  The cystoscope was then removed.    The  patient tolerated the procedure well and there was no complication. Patient was awoken from anesthesia and taken to the recovery room in stable condition. I was present and  scrubbed for the entirety of the case.  Plan:  Patient will be discharged home. He will f/u in 2 weeks for stent removal   G. Donald Pore MD [facility]  Pager: [phone]

## 2021-07-05 NOTE — Patient Outreach (Signed)
Received a referral notification from Insurance for Calvin Brown. I have assigned Calvin Lovely, NP to call for follow up and determine if there are any Case Management needs.    Iverson Alamin, Donivan Scull [facility] Care Management Assistant [facility] Care Management [phone]

## 2021-07-08 ENCOUNTER — Ambulatory Visit (INDEPENDENT_AMBULATORY_CARE_PROVIDER_SITE_OTHER): Payer: Medicare Other | Admitting: Pulmonary Disease

## 2021-07-08 ENCOUNTER — Encounter (HOSPITAL_COMMUNITY): Payer: Self-pay | Admitting: Urology

## 2021-07-08 VITALS — BP 110/68 | HR 68 | Temp 97.7°F | Ht 70.5 in | Wt 181.4 lb

## 2021-07-08 DIAGNOSIS — J84112 Idiopathic pulmonary fibrosis: Secondary | ICD-10-CM | POA: Diagnosis not present

## 2021-07-08 LAB — PULMONARY FUNCTION TEST
DL/VA % pred: 107 %
DL/VA: 4.09 ml/min/mmHg/L
DLCO cor % pred: 59 %
DLCO cor: 14.2 ml/min/mmHg
DLCO unc % pred: 54 %
DLCO unc: 12.99 ml/min/mmHg
FEF 25-75 Post: 1.99 L/sec
FEF 25-75 Pre: 1.15 L/sec
FEF2575-%Change-Post: 72 %
FEF2575-%Pred-Post: 115 %
FEF2575-%Pred-Pre: 66 %
FEV1-%Change-Post: 12 %
FEV1-%Pred-Post: 73 %
FEV1-%Pred-Pre: 65 %
FEV1-Post: 1.97 L
FEV1-Pre: 1.75 L
FEV1FVC-%Change-Post: 7 %
FEV1FVC-%Pred-Pre: 103 %
FEV6-%Change-Post: 4 %
FEV6-%Pred-Post: 69 %
FEV6-%Pred-Pre: 65 %
FEV6-Post: 2.47 L
FEV6-Pre: 2.36 L
FEV6FVC-%Change-Post: 0 %
FEV6FVC-%Pred-Post: 106 %
FEV6FVC-%Pred-Pre: 105 %
FVC-%Change-Post: 4 %
FVC-%Pred-Post: 65 %
FVC-%Pred-Pre: 62 %
FVC-Post: 2.51 L
FVC-Pre: 2.4 L
Post FEV1/FVC ratio: 78 %
Post FEV6/FVC ratio: 99 %
Pre FEV1/FVC ratio: 73 %
Pre FEV6/FVC Ratio: 98 %
RV % pred: 106 %
RV: 3 L
TLC % pred: 74 %
TLC: 5.33 L

## 2021-07-08 MED ORDER — FLUTICASONE FUROATE-VILANTEROL 100-25 MCG/ACT IN AEPB: 1.0000 | INHALATION_SPRAY | Freq: Every day | RESPIRATORY_TRACT | 2 refills | Status: AC

## 2021-07-08 NOTE — Patient Instructions (Addendum)
Idiopathic pulmonary fibrosis PFTs with worsening lung function now moderately reduced DLCO (gas exchange). No evidence of COPD --Defer auto-immune work-up. No biopsy indicated based on imaging which is diagnostic --No indication for oxygen therapy --Discussed initiation of Ofev including benefits and risks. Start enrollment today --Recommend annual PFTs to monitor for progression. Due 12/2021  Chronic bronchitis manifesting as dyspnea and wheezing --RESTART Breo 100-25 mcg ONE puff ONCE a day --CONTINUE Albuterol as needed for shortness of breath and wheezing  Follow-up with me in 3 months

## 2021-07-08 NOTE — Progress Notes (Signed)
Subjective:   PATIENT ID: Calvin Brown GENDER: male DOB: 02-06-1933, MRN: [id number]   HPI  Chief Complaint  Patient presents with   Follow-up    SOB at times, PFT review     Reason for Visit: Follow-up  [name] is a 88 year old male former smoker with CAD s/p CABG, COPD, HTN, CVA, atrial fibrillation Xarelto who presents for follow-up.  Synopsis: He was referred by his PCP, Dr. Thornell Mule, for findings of ILD on recent CT Chest. Note from 05/04/20 reviewed. Shortness suspected from deconditioning. Reportedly had COPD on PFTs 10 years ago.  He reports gradually shortness of breath that worsened in the last year. Occasional wheezing at night. He has had limited activity including walking, biking. Less active in the last few years. He is participating in physical therapy for left foot drop (7 years ago) stumbling.   12/31/20 After Thanksgiving, family had cold symptoms. He completed 10 days of pneumonia. Improved fatigue shortness of breath and cough. Cough has improved with some residual cough during the day. Took nyquil nighttime with improvement. He has been compliant with Breo. No wheezing.  07/08/21 Present with wife who provides additional history. Since our last visit he was hospitalized for SDH. Also recently had left lithotripsy and ureteral stent placement for hydronephrosis secondary to stones in June. He denies shortness of breath but limited activity. Wife reports he has shortness of breath. No cough or wheezing. Not currently taking Breo  Social History: Retired Investment banker, operational  Past Medical History:  Diagnosis Date   Atrial fibrillation (HCC)    CAD (coronary artery disease)    CVA (cerebral vascular accident) (HCC) 05/27/2013   GI bleed 05/27/2013   History of blood transfusion    GI Bleed   History of kidney stones    surgery to remove   HTN (hypertension)    Hx of CABG    Possibly 2015?   Hyperlipidemia    Pneumonia     No Known Allergies    Outpatient Medications Prior to Visit  Medication Sig Dispense Refill   AMBULATORY NON FORMULARY MEDICATION Medication Name: Diltiazem ointment - Using your index finger, apply a small amount of medication inside the rectum up to your first knuckle/joint three times daily x 6-8 weeks. 30 g 1   Cholecalciferol (VITAMIN D) 125 MCG (5000 UT) CAPS Take 5,000 Units by mouth in the morning.     diltiazem 2 % GEL Place 1 application. rectally 3 (three) times daily as needed (ANAL FISSURE PAIN.). (PATIENT PRESCRIPTION IS FOR OINTMENT) Using your index finger, apply a small amount inside the rectum up to your first knuckle/joint three times daily x6-8 weeks.     ezetimibe (ZETIA) 10 MG tablet Take 10 mg by mouth daily.     famotidine (PEPCID) 20 MG tablet Take 20 mg by mouth every 8 (eight) hours.     ibandronate (BONIVA) 150 MG tablet Take 150 mg by mouth every 30 (thirty) days.     linaclotide (LINZESS) 145 MCG CAPS capsule Take 1 capsule (145 mcg total) by mouth daily before breakfast. 30 capsule 3   Melatonin 10 MG TABS Take 10 mg by mouth at bedtime.     Multiple Vitamin (MULTIVITAMIN WITH MINERALS) TABS tablet Take 1 tablet by mouth in the morning.     omeprazole (PRILOSEC) 20 MG capsule Take 1 capsule (20 mg total) by mouth daily. 30 capsule 11   oxyCODONE-acetaminophen (PERCOCET/ROXICET) 5-325 MG tablet Take 1-2 tablets by mouth every 6 (six)  hours as needed.     Potassium 99 MG TABS Take 198 mg by mouth in the morning.     rivaroxaban (XARELTO) 20 MG TABS tablet Take 20 mg by mouth daily with supper.     rosuvastatin (CRESTOR) 20 MG tablet Take 1 tablet (20 mg total) by mouth at bedtime for 90 doses.     tamsulosin (FLOMAX) 0.4 MG CAPS capsule Take 2 capsules (0.8 mg total) by mouth daily. (Patient taking differently: Take 0.4 mg by mouth in the morning and at bedtime.) 30 capsule    torsemide (DEMADEX) 5 MG tablet Take 5 mg by mouth in the morning.     acetaminophen (TYLENOL) 325 MG tablet  Take 1-2 tablets (325-650 mg total) by mouth every 4 (four) hours as needed for mild pain. (Patient not taking: Reported on 07/08/2021)     lisinopril (ZESTRIL) 20 MG tablet TAKE 1 TABLET BY MOUTH EVERY DAY IN THE MORNING (Patient not taking: Reported on 07/08/2021) 30 tablet 0   meloxicam (MOBIC) 7.5 MG tablet Take 1 tablet (7.5 mg total) by mouth 2 (two) times daily as needed for pain. (Patient not taking: Reported on 07/08/2021) 28 tablet 0   ondansetron (ZOFRAN) 4 MG tablet Take 4 mg by mouth every 6 (six) hours as needed. (Patient not taking: Reported on 07/08/2021)     oxyCODONE-acetaminophen (PERCOCET) 5-325 MG tablet Take 1 tablet by mouth every 6 (six) hours as needed for severe pain. (Patient not taking: Reported on 07/08/2021) 16 tablet 0   BREO ELLIPTA 100-25 MCG/ACT AEPB TAKE 1 PUFF BY MOUTH EVERY DAY (Patient not taking: Reported on 06/21/2021) 60 each 2   No facility-administered medications prior to visit.    Review of Systems  Constitutional:  Negative for chills, diaphoresis, fever, malaise/fatigue and weight loss.  HENT:  Negative for congestion.   Respiratory:  Positive for shortness of breath. Negative for cough, hemoptysis, sputum production and wheezing.   Cardiovascular:  Negative for chest pain, palpitations and leg swelling.     Objective:   Vitals:   07/08/21 1424  BP: 110/68  Pulse: 68  Temp: 97.7 F (36.5 C)  TempSrc: Oral  SpO2: 97%  Weight: 181 lb 5.8 oz (82.3 kg)  Height: 5' 10.5" (1.791 m)   SpO2: 97 % O2 Device: None (Room air)  Physical Exam: General: Well-appearing, no acute distress HENT: , AT Eyes: EOMI, no scleral icterus Respiratory: Diminished breath sounds bilaterally. No wheezing or rhonchi Cardiovascular: RRR, -M/R/G, no JVD Extremities:-Edema,-tenderness Neuro: AAO x4, CNII-XII grossly intact Psych: Normal mood, normal affect  Data Reviewed:  Imaging: CT chest 05/28/2020-mild subpleural groundglass predominantly in the bases.   Bibasilar reticulation and interstitial thickening associated with bronchiectasis.  Mild honeycombing in the lung bases bilaterally.  Posterior right upper lobe tree-in-bud with micro nodularity noted CXR 12/31/20 - Right perihilar and left basilar atelectasis/scarring  PFT: 08/07/20 FVC 3.19 (81%) FEV1 2.42 (88%) Ratio 74  TLC 81% DLCO 71% Interpretation: No evidence of obstructive or restrictive defect. However DLCO mildly reduced in setting of low-normal TLC. This would consistent with his known ILD  07/08/21 FVC 2.51 (65%) FEV1 1.97 (73%) Ratio 73  TLC 74% DLCO 54% Interpretation: Moderate restrictive defect with moderately reduced DLCO   Echocardiogram: 07/15/16 [facility] report) - EF 55-60%. Normal valves. Mild AI, mild MR, mild PI and mild TR. Mild left atrial and mild left ventricular enlargement. Normal estimated PA systolic pressure 04/16/20 - EF 55-60%. No valvular or WMA. Severe left atrial dilation. Mod TR  regurg. 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 Reviewed PFTs which demonstrated decline in DLCO. Discussed options including anti-fibrotics to slow progression of lung decline. Also discussed adverse effects including GI. After discussion will pursue enrollment and financial assistance in anti-fibrotic. Counseled on compliance with bronchodilators for symptomatic benefit.   Inhaler History Anoro ineffective.  Breo with improved symptoms. However self-discontinued since last visit  Idiopathic pulmonary fibrosis PFTs with moderately reduced DLCO (gas exchange). No evidence of COPD --Defer auto-immune work-up. No biopsy indicated based on imaging which is diagnostic --No indication for oxygen therapy --Discussed initiation of Ofev including benefits and risks. Will start enrollment process. Will also discuss his case with research team as  he may be a candidate for research study if interested --Recommend annual PFTs to monitor for progression. Due 08/2021  Chronic bronchitis manifesting as dyspnea and wheezing --RESTART 100-25 mcg ONE puff ONCE a day --CONTINUE Albuterol as needed for shortness of breath and wheezing  Health Maintenance Immunization History  Administered Date(s) Administered   Fluad Quad(high Dose 65+) 11/22/2020   Influenza Split 11/16/2019   Moderna Sars-Covid-2 Vaccination 02/17/2019, 03/08/2019, 12/28/2019, 05/23/2020   CT Lung Screen - not qualified. Quit 53 years ago  No orders of the defined types were placed in this encounter.  Meds ordered this encounter  Medications   fluticasone furoate-vilanterol (BREO ELLIPTA) 100-25 MCG/ACT AEPB    Sig: Inhale 1 puff into the lungs daily.    Dispense:  60 each    Refill:  2   Return in about 3 months (around 10/08/2021).  I have spent a total time of 30-minutes on the day of the appointment including chart review, data review, collecting history, coordinating care and discussing medical diagnosis and plan with the patient/family. Past medical history, allergies, medications were reviewed. Pertinent imaging, labs and tests included in this note have been reviewed and interpreted independently by me.  Laquisha Northcraft Mechele CollinJane Tiawanna Luchsinger, MD  Pulmonary Critical Care 07/08/2021 7:59 PM  Office Number [phone]

## 2021-07-08 NOTE — Progress Notes (Signed)
PFT done today. 

## 2021-07-09 ENCOUNTER — Ambulatory Visit: Payer: Medicare Other | Admitting: Cardiology

## 2021-07-10 ENCOUNTER — Ambulatory Visit (HOSPITAL_COMMUNITY)
Admission: RE | Admit: 2021-07-10 | Discharge: 2021-07-10 | Disposition: A | Discharge status: Home / Self Care | Payer: Medicare Other | Source: Ambulatory Visit | Attending: Neurological Surgery | Admitting: Neurological Surgery

## 2021-07-10 ENCOUNTER — Telehealth: Payer: Self-pay | Admitting: Pharmacist

## 2021-07-10 DIAGNOSIS — I6203 Nontraumatic chronic subdural hemorrhage: Secondary | ICD-10-CM | POA: Diagnosis present

## 2021-07-10 NOTE — Telephone Encounter (Signed)
Received new start paperwork for Ofev. Patient and provider completed [employer] patient assistance form. Based on income written down (which we should confirm with patient) patient would not qualify for patient assistance program due to exceeding threshold for household of 2.  Please start Ofev BIV.  Dose: 150mg  twice daily  Dx: IPF )  Will likely need updated LFTs prior to starting (last LFTs from 04/25/21 wnl).  04/27/21, PharmD, MPH, BCPS, CPP Clinical Pharmacist (Rheumatology and Pulmonology)

## 2021-07-16 ENCOUNTER — Encounter: Payer: Self-pay | Admitting: Gastroenterology

## 2021-07-16 ENCOUNTER — Ambulatory Visit (INDEPENDENT_AMBULATORY_CARE_PROVIDER_SITE_OTHER): Payer: Medicare Other | Admitting: Gastroenterology

## 2021-07-16 VITALS — BP 112/56 | HR 66 | Resp 18 | Ht 71.0 in | Wt 181.0 lb

## 2021-07-16 DIAGNOSIS — K602 Anal fissure, unspecified: Secondary | ICD-10-CM | POA: Diagnosis not present

## 2021-07-16 DIAGNOSIS — K5909 Other constipation: Secondary | ICD-10-CM | POA: Diagnosis not present

## 2021-07-16 DIAGNOSIS — I6523 Occlusion and stenosis of bilateral carotid arteries: Secondary | ICD-10-CM

## 2021-07-16 NOTE — Patient Instructions (Signed)
_______________________________________________________  If you are age 86 or older, your body mass index should be between 23-30. Your Body mass index is 25.24 kg/m. If this is out of the aforementioned range listed, please consider follow up with your Primary Care Provider.  If you are age 64 or younger, your body mass index should be between 19-25. Your Body mass index is 25.24 kg/m. If this is out of the aformentioned range listed, please consider follow up with your Primary Care Provider.   ________________________________________________________  The [place] [facility] providers would like to encourage you to use MYCHART to communicate with providers for non-urgent requests or questions.  Due to long hold times on the telephone, sending your provider a message by MYCHART may be a faster and more efficient way to get a response.  Please allow 48 business hours for a response.  Please remember that this is for non-urgent requests.  _______________________________________________________  It was a pleasure to see you today!  Thank you for trusting me with your gastrointestinal care!    

## 2021-07-16 NOTE — Progress Notes (Signed)
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  He developed an anal fissure causing significant amount of pain.  Treatment was MiraLAX purge followed by Linzess and application of diltiazem gel and RectiCare ointment.  [name] was here with his wife today.  After the bowel purge she started feeling much better, and with continued use of RectiCare ointment his rectal pain resolved.  He is using Linzess 145 mcg daily with stools that are small and usually soft but sometimes he needs a Dulcolax tablet once or twice a week if there is no BM for 2 or 3 days.  ROS: Cardiovascular:  no chest pain Respiratory: no dyspnea  The patient's Past Medical, Family and Social History were reviewed and are on file in the EMR. He has multiple medical issues outlined in previous notes including IPF, CAD with permanent A-fib (see recent pulmonary and cardiology notes) and hospitalization for subdural hematoma with embolization of medial meningeal artery in April of this year. He also had hematuria and underwent left ureteroscopy with laser lithotripsy and basket extraction of stones and stent placement on 07/02/2021  Objective:  Med list reviewed  Current Outpatient Medications:    acetaminophen (TYLENOL) 325 MG tablet, Take 1-2 tablets (325-650 mg total) by mouth every 4 (four) hours as needed for mild pain., Disp: , Rfl:    AMBULATORY NON FORMULARY MEDICATION, Medication Name: Diltiazem ointment - Using your index finger, apply a small amount of medication inside the rectum up to your first knuckle/joint three times daily x 6-8 weeks., Disp: 30 g, Rfl: 1   Cholecalciferol (VITAMIN D) 125 MCG (5000 UT) CAPS, Take 5,000 Units by mouth in the morning., Disp: , Rfl:    diltiazem 2 % GEL, Place 1 application. rectally 3 (three)  times daily as needed (ANAL FISSURE PAIN.). (PATIENT PRESCRIPTION IS FOR OINTMENT) Using your index finger, apply a small amount inside the rectum up to your first knuckle/joint three times daily x6-8 weeks., Disp: , Rfl:    ezetimibe (ZETIA) 10 MG tablet, Take 10 mg by mouth daily., Disp: , Rfl:    famotidine (PEPCID) 20 MG tablet, Take 20 mg by mouth every 8 (eight) hours., Disp: , Rfl:    fluticasone furoate-vilanterol (BREO ELLIPTA) 100-25 MCG/ACT AEPB, Inhale 1 puff into the lungs daily., Disp: 60 each, Rfl: 2   ibandronate (BONIVA) 150 MG tablet, Take 150 mg by mouth every 30 (thirty) days., Disp: , Rfl:    linaclotide (LINZESS) 145 MCG CAPS capsule, Take 1 capsule (145 mcg total) by mouth daily before breakfast., Disp: 30 capsule, Rfl: 3   Melatonin 10 MG TABS, Take 10 mg by mouth at bedtime., Disp: , Rfl:    Multiple Vitamin (MULTIVITAMIN WITH MINERALS) TABS tablet, Take 1 tablet by mouth in the morning., Disp: , Rfl:    omeprazole (PRILOSEC) 20 MG capsule, Take 1 capsule (20 mg total) by mouth daily., Disp: 30 capsule, Rfl: 11   Potassium 99 MG TABS, Take 198 mg by mouth in the morning., Disp: , Rfl:    rivaroxaban (XARELTO) 20 MG TABS tablet, Take 20 mg by mouth daily with supper., Disp: , Rfl:    rosuvastatin (CRESTOR) 20 MG tablet, Take 1 tablet (20 mg total) by mouth at bedtime for 90 doses., Disp: , Rfl:    tamsulosin (FLOMAX) 0.4  MG CAPS capsule, Take 2 capsules (0.8 mg total) by mouth daily. (Patient taking differently: Take 0.4 mg by mouth in the morning and at bedtime.), Disp: 30 capsule, Rfl:    torsemide (DEMADEX) 5 MG tablet, Take 5 mg by mouth in the morning., Disp: , Rfl:    Vital signs in last 24 hrs: Vitals:   07/16/21 1456  BP: (!) 112/56  Pulse: 66  Resp: 18  SpO2: 97%   Wt Readings from Last 3 Encounters:  07/16/21 181 lb (82.1 kg)  07/08/21 181 lb 5.8 oz (82.3 kg)  07/02/21 172 lb 13.5 oz (78.4 kg)    Physical Exam  Slow antalgic gait, uses cane, has lower  leg braces.  Gets on exam table with assistance  Abdomen: soft, no tenderness, with active bowel sounds. No guarding or palpable hepatosplenomegaly. Perianal exam normal.  DRE with decreased resting and voluntary sphincter tone.  Semiformed stool rectal vault, no fissure but remnant scar tissue of prior fissure felt posteriorly  Labs:   ___________________________________________ Radiologic studies:   ____________________________________________ Other:   _____________________________________________ Assessment & Plan  Assessment: Encounter Diagnoses  Name Primary?   Chronic constipation Yes   Anal fissure    Chronic constipation that acutely worse when immobilized in rehab after hospital stay.  Resultant anal fissure now healed.  I would like to simplify his regimen if possible, so I recommended stopping the Linzess and using 1 duplex tablet every other day.  If he needs to add half to a whole capful of MiraLAX every 1 to 2 days as needed, that would be fine.  May be difficult to achieve a happy medium given his decreased anorectal sensation, sphincter tone and limited mobility. Plan: His wife was encouraged to contact us by phone or portal message for further advice as needed. He can stop using the ointments.   Calvin Brown

## 2021-07-17 ENCOUNTER — Other Ambulatory Visit (HOSPITAL_COMMUNITY): Payer: Self-pay

## 2021-07-17 NOTE — Telephone Encounter (Signed)
Spoke with patient and his wife regarding Ofev approval through insurance. I called to confirm the income information that was listed on application. She states it is accurate. Reviewed that based on this information, they do exceed financial eligibility requirements for [facility] Cares patient assistance.  I briefly reviewed side effects including most commonly diarrhea and abdominal pain. Reviewed LFT monitoring. Reviewed small but studied risk for DVT, PE, MI while on Ofev. Patient has history cerebral aneurysm, lower GI Bleed, afib. With his diagnosis  Patient's wife is concerned about patient starting a medication that would also affect patient's bowel movements considering they are actively working with GI to regulate BM.   I reviewed another FDA-approved option for IPF called Esbriet. Reviewed that there is generic available as well which may help with cost reduction. Rviewed that medication will still likely be costly due to being specialty (Tier 5 with [employer]). Also reviewed major side effect of nausea and requirement for LFT monitorin. Reviewed that there is no cardiac risks studied or observed and this may be better option. They are interested in this option. Wife states that patient tolerates most medications without nausea so would prefer this. Reviewed that I will discuss with Dr. Everardo All first and then move forward once ok'd  Chesley Mires, PharmD, MPH, BCPS, CPP Clinical Pharmacist (Rheumatology and Pulmonology)

## 2021-07-17 NOTE — Telephone Encounter (Signed)
Submitted a Prior Authorization request to [facility] for OFEV via CoverMyMeds. Will update once we receive a response.   Key: [id number]

## 2021-07-17 NOTE — Telephone Encounter (Addendum)
Received notification from [facility] regarding a prior authorization for OFEV. Authorization has been APPROVED from 07/17/2021 to 01/26/2022. Approval letter sent to scan center.  Per test claim, copay for 30 days supply is $1,517.47  Patient can fill through [facility]: [phone]   Authorization # [id number]   Will work on finalizing paperwork for PAP submission.

## 2021-07-17 NOTE — Telephone Encounter (Signed)
Agree with pursuing Esbriet

## 2021-07-18 ENCOUNTER — Telehealth: Payer: Self-pay | Admitting: Pharmacist

## 2021-07-18 DIAGNOSIS — Z5181 Encounter for therapeutic drug level monitoring: Secondary | ICD-10-CM

## 2021-07-18 DIAGNOSIS — J84112 Idiopathic pulmonary fibrosis: Secondary | ICD-10-CM

## 2021-07-18 NOTE — Telephone Encounter (Signed)
Please start pirfenidone BIV.  Dose using 267mg  tab: Take 1 tab three times daily for 7 days, then 2 tabs three times daily for 7 days, then 3 tabs three times daily thereafter.  Dx: IPF )  Therapies patient unable to try: Ofev - cardiac risk factors  (M19.622, PharmD, MPH, BCPS, CPP Clinical Pharmacist (Rheumatology and Pulmonology)

## 2021-07-19 ENCOUNTER — Other Ambulatory Visit (HOSPITAL_COMMUNITY): Payer: Self-pay

## 2021-07-22 ENCOUNTER — Other Ambulatory Visit (HOSPITAL_COMMUNITY): Payer: Self-pay

## 2021-07-22 MED ORDER — PIRFENIDONE 267 MG PO TABS
ORAL_TABLET | ORAL | 0 refills | Status: DC
  Filled 2021-07-22: qty 207, fill #0
  Filled 2021-07-24: qty 207, 30d supply, fill #0

## 2021-07-22 MED ORDER — PIRFENIDONE 267 MG PO TABS
801.0000 mg | ORAL_TABLET | Freq: Three times a day (TID) | ORAL | 2 refills | Status: DC
  Filled 2021-07-22 – 2021-08-19 (×2): qty 270, 30d supply, fill #0

## 2021-07-24 ENCOUNTER — Other Ambulatory Visit (HOSPITAL_COMMUNITY): Payer: Self-pay

## 2021-07-24 ENCOUNTER — Other Ambulatory Visit: Payer: Self-pay | Admitting: Cardiology

## 2021-07-24 DIAGNOSIS — I5032 Chronic diastolic (congestive) heart failure: Secondary | ICD-10-CM

## 2021-07-24 DIAGNOSIS — I2510 Atherosclerotic heart disease of native coronary artery without angina pectoris: Secondary | ICD-10-CM

## 2021-07-24 NOTE — Telephone Encounter (Signed)
Delivery instructions have been updated in [employer], medication will be shipped to patient's home address by 07/26/21.  Rx has been processed in [facility] and there is a copay of $249.58. Payment information has been collected and forwarded to the pharmacy.

## 2021-08-02 ENCOUNTER — Other Ambulatory Visit: Payer: Self-pay

## 2021-08-02 ENCOUNTER — Other Ambulatory Visit: Payer: Self-pay | Admitting: *Deleted

## 2021-08-02 MED ORDER — RIVAROXABAN 20 MG PO TABS: 20.0000 mg | ORAL_TABLET | Freq: Every day | ORAL | 0 refills | Status: DC

## 2021-08-02 MED ORDER — TORSEMIDE 5 MG PO TABS: 5.0000 mg | ORAL_TABLET | Freq: Every morning | ORAL | 0 refills | Status: DC

## 2021-08-02 NOTE — Patient Outreach (Signed)
[facility]) Care Management  08/02/2021  Bodee Lafoe 1934-01-15 [id number]  [facility] referral for care management. 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  Patient Active Problem List   Diagnosis Date Noted   Status post coil embolization of cerebral aneurysm 05/03/2021   Subdural hematoma (HCC) 05/03/2021   TBI (traumatic brain injury) (HCC) 04/24/2021   Epidural hematoma (HCC) 04/21/2021   Lower GI bleed 04/21/2021   Chronic bronchitis (HCC) 01/11/2021   IPF (idiopathic pulmonary fibrosis) (HCC) 07/18/2020   Atrial fibrillation (HCC) 06/28/2020   Benign prostatic hyperplasia with lower urinary tract symptoms 06/28/2020   Cyst of kidney, acquired 06/28/2020   Dyspnea on exertion 06/28/2020   Edema 06/28/2020   Hypercoagulable state (HCC) 06/28/2020   Hyperlipidemia 06/28/2020   Hypertension 06/28/2020   Senile purpura (HCC) 06/28/2020   Left foot drop 06/28/2020   Late effects of cerebrovascular disease 06/28/2020   Vitamin D deficiency 06/28/2020   Abnormal gait 06/28/2020   Outpatient Encounter Medications as of 08/02/2021  Medication Sig   acetaminophen (TYLENOL) 325 MG tablet Take 1-2 tablets (325-650 mg total) by mouth every 4 (four) hours as needed for mild pain.   AMBULATORY NON FORMULARY MEDICATION Medication Name: Diltiazem ointment - Using your index finger, apply a small amount of medication inside the rectum up to your first knuckle/joint three times daily x 6-8 weeks.   Cholecalciferol (VITAMIN D) 125 MCG (5000 UT) CAPS Take 5,000 Units by mouth in the morning.   diltiazem 2 % GEL Place 1 application. rectally 3 (three) times daily as needed (ANAL FISSURE PAIN.). (PATIENT PRESCRIPTION IS FOR OINTMENT) Using your index finger, apply a small amount inside the rectum up to your first knuckle/joint three times daily x6-8 weeks.   ezetimibe (ZETIA) 10 MG tablet Take 10 mg by mouth daily.    famotidine (PEPCID) 20 MG tablet Take 20 mg by mouth every 8 (eight) hours.   fluticasone furoate-vilanterol (BREO ELLIPTA) 100-25 MCG/ACT AEPB Inhale 1 puff into the lungs daily.   ibandronate (BONIVA) 150 MG tablet Take 150 mg by mouth every 30 (thirty) days.   linaclotide (LINZESS) 145 MCG CAPS capsule Take 1 capsule (145 mcg total) by mouth daily before breakfast.   lisinopril (ZESTRIL) 20 MG tablet TAKE 1 TABLET BY MOUTH EVERY DAY IN THE MORNING   Melatonin 10 MG TABS Take 10 mg by mouth at bedtime.   Multiple Vitamin (MULTIVITAMIN WITH MINERALS) TABS tablet Take 1 tablet by mouth in the morning.   omeprazole (PRILOSEC) 20 MG capsule Take 1 capsule (20 mg total) by mouth daily.   Pirfenidone (ESBRIET) 267 MG TABS Take 1 tab three times daily for 7 days, then 2 tabs three times daily for 7 days, then 3 tabs three times daily thereafter.   [START ON 08/21/2021] Pirfenidone (ESBRIET) 267 MG TABS Take 3 tablets (801 mg total) by mouth in the morning, at noon, and at bedtime.   Potassium 99 MG TABS Take 198 mg by mouth in the morning.   rivaroxaban (XARELTO) 20 MG TABS tablet Take 20 mg by mouth daily with supper.   rosuvastatin (CRESTOR) 20 MG tablet Take 1 tablet (20 mg total) by mouth at bedtime for 90 doses.   tamsulosin (FLOMAX) 0.4 MG CAPS capsule Take 2 capsules (0.8 mg total) by mouth daily. (Patient taking differently: Take 0.4 mg by mouth in the morning and at bedtime.)   torsemide (DEMADEX) 5 MG tablet Take 5 mg  by mouth in the morning.   No facility-administered encounter medications on file as of 08/02/2021.   PATIENT MAIN CONCERN TODAY IS HIS RESIDUAL EFFECTS OF HIS CVA IN 2015 AND RECENT TRAUMATIC BRAIN INJURY FROM A FALL. HE IS CURRENTLY GOING TO OUTPT PT. HE REPORTS HE HAS LEFT SIDED WEAKNESS, SOME FOOT DROP. HE IS ABLE TO AMBULATE WITH A CANE.  PT HAD TO CUT CALL SHORT DUE TO HIS PT APPT. WE AGREED TO TALK AGAIN NEXT WEEK.  NP WOULD LIKE TO FOCUS ON FALL PREVENTION WHICH  COINCIDES WITH PT DESIRE TO WORK ON RESIDUAL STROKE EFFECTS AND TRAUMATIC BRAIN INJURY FROM FALL. NP TO INVESTIGATE CIRCUMSTANCE OF FALL ON NEXT CALL.  Zara Council. Burgess Estelle, MSN, [facility] Gerontological Nurse Practitioner [facility] Care Management [phone]

## 2021-08-05 ENCOUNTER — Other Ambulatory Visit: Payer: Self-pay

## 2021-08-05 MED ORDER — RIVAROXABAN 20 MG PO TABS: 20.0000 mg | ORAL_TABLET | Freq: Every day | ORAL | 3 refills | Status: DC

## 2021-08-05 MED ORDER — TORSEMIDE 5 MG PO TABS: 5.0000 mg | ORAL_TABLET | Freq: Every morning | ORAL | 3 refills | Status: AC

## 2021-08-08 ENCOUNTER — Other Ambulatory Visit: Payer: Self-pay | Admitting: Cardiology

## 2021-08-08 DIAGNOSIS — I5032 Chronic diastolic (congestive) heart failure: Secondary | ICD-10-CM

## 2021-08-08 DIAGNOSIS — I2510 Atherosclerotic heart disease of native coronary artery without angina pectoris: Secondary | ICD-10-CM

## 2021-08-09 ENCOUNTER — Ambulatory Visit: Payer: Self-pay | Admitting: *Deleted

## 2021-08-09 NOTE — Patient Outreach (Signed)
  Care Coordination   Follow Up Visit Note   08/09/2021 Name: Calvin Brown MRN: [id number] DOB: Jul 31, 1933  Calvin Brown is a 88 y.o. year old male who sees Charlane Ferretti, [place] for primary care. I spoke with  Calvin Brown by phone today  What matters to the patients health and wellness today?  Improving mobility and preventing falls.   Goals Addressed               This Visit's Progress     Patient Stated     Patient Stated (pt-stated)        I will attend all my outpt therapy sessions and follow strict safety/fall precautions avoiding any falls over the next 30 days.        SDOH assessments and interventions completed:   Yes, previously assessed.  Care Coordination Interventions Activated:  Yes Care Coordination Interventions:   Yes, provided. We discussed current ongoing therapy sessions and importance of attending all his scheduled visits. Calvin Brown shared that he uses a urinal at night avoiding getting out of bed. He ambulates with a cane at home. He has an elevated commode and a walk in shower with a built in bench. There is also a hand held shower nozzle. His wife gives minimal assist and is in the bathroom supervising his safety. We discussed be precautions when getting out of bed to avoid dizziness and possible falls due to BP changes. Calvin Brown is in agreement with the goal and all things we reviewed today.  Follow up plan: Follow up call scheduled for 2 weeks.  Encounter Outcome:  Pt. Visit Completed  [name], MSN, [facility] Gerontological Nurse Practitioner [facility] Care Management [phone]

## 2021-08-19 ENCOUNTER — Other Ambulatory Visit (HOSPITAL_COMMUNITY): Payer: Self-pay

## 2021-08-22 ENCOUNTER — Other Ambulatory Visit (HOSPITAL_COMMUNITY): Payer: Self-pay

## 2021-08-22 ENCOUNTER — Ambulatory Visit: Payer: Self-pay | Admitting: *Deleted

## 2021-08-22 NOTE — Patient Outreach (Addendum)
  Care Coordination   Follow Up Visit Note   08/22/2021 Name: Calvin Brown MRN: [id number] DOB: [birth year]  Calvin Brown is a 88 y.o. year old male who sees Calvin Brown, [place] for primary care. I spoke with  Calvin Brown by phone today  What matters to the patients health and wellness today?  Resolution of UTI and urinary incontinence.   Goals Addressed               This Visit's Progress     Patient Stated     Patient Stated (pt-stated)   On track     I will attend all my outpt therapy sessions and follow strict safety/fall precautions avoiding any falls over the next 30 days.      Other     Resolving UTI and urinary incontinence.          SDOH assessments and interventions completed:   Yes, previously addressed.  Care Coordination Interventions Activated:  Yes  Care Coordination Interventions:  Yes, provided.  1)Pt reports his PT is going well and he is progressing. He did however have a fall at home. He was taking off his Tshirt to go to bed and he fell backwards onto the floor. He did not sustain any injury. He has changed that habit and now sits down to take off his Tshirt. Reinforced fall precautions. 2)Pt has taken urine specimen to urology, was started on antibiotic. Incontinence began with this infection. Pt to see urology tomorrow. NP to follow up next week. Request pt to ask urology for possible incontinence apparatus ie condom cath or other. Pt currently does wear brief to bed but he is soaking that AND his bed. 3)Reviewed possible gaps in care. PCP does not record in Epic. Sent message to MD to review and notify is pt has had a TDAP, Zoster, Pneumonia Vaccines.  Follow up plan: Follow up call scheduled for next Thursday.  Encounter Outcome:  Pt. Visit Completed  [name], MSN, [facility] [place] Gerontological Nurse Practitioner [facility] Care Management [phone]

## 2021-08-25 ENCOUNTER — Encounter: Payer: Self-pay | Admitting: Pulmonary Disease

## 2021-08-26 ENCOUNTER — Encounter: Payer: Self-pay | Admitting: Pulmonary Disease

## 2021-08-26 NOTE — Telephone Encounter (Signed)
Dr Everardo All, please advise on pt email:  Hi, this is his son steve. 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 Just got off the phone with my dad. He's been doing poorly since he started preventadone. Very weak tired no energy no appetite four pound weight loss. Complicated by what sound like UTI symptoms over the past week. I suspect these are unrelated but he's even weaker now. Fell days ago and has a shoulder separation. 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 Someone prescribed him macrodantin a week ago which seems to be doing nothing. Not sure who prescribed it to him and I can't find his primary care in the system. Either way if you could look into at least the pitfenidone I would be very grateful. Thanks, Estelle Grumbles MD

## 2021-08-26 NOTE — Telephone Encounter (Signed)
Please see telephone note/my chart note response on 08/26/21.

## 2021-08-26 NOTE — Telephone Encounter (Signed)
Called patient's wife. He is currently getting a CXR. We discussed that he has significant fatigue and decrease in appetite and 4lb weight loss after increasing pirfenidone to three times daily. He also is also being treated for UTI by Urology right now. His symptoms likely multifactorial but will go ahead and discontinue Pirfenidone for now.  Patient's wife requesting to return recent refill that is unopened. Will send message to Pulmonary pharmacy team.

## 2021-08-26 NOTE — Telephone Encounter (Signed)
Called patient's wife regarding pirfenidone supply that is unopened. Advised that pharmacy will likely be unable to take medication back - this is legally not allowed. I did advise her to drop off unopened bottle to pulmonary clinic so we can provide to another patient in need. She will plan to drop off and will let front desk know it's for pharmacy team  Chesley Mires, PharmD, MPH, BCPS, CPP Clinical Pharmacist (Rheumatology and Pulmonology)

## 2021-08-29 ENCOUNTER — Emergency Department (HOSPITAL_COMMUNITY): Payer: Medicare Other

## 2021-08-29 ENCOUNTER — Emergency Department (HOSPITAL_COMMUNITY)
Admission: EM | Admit: 2021-08-29 | Discharge: 2021-08-29 | Disposition: A | Discharge status: Home / Self Care | Payer: Medicare Other | Source: Home / Self Care | Attending: Emergency Medicine | Admitting: Emergency Medicine

## 2021-08-29 ENCOUNTER — Encounter (HOSPITAL_COMMUNITY): Payer: Self-pay | Admitting: Emergency Medicine

## 2021-08-29 ENCOUNTER — Ambulatory Visit: Payer: Self-pay | Admitting: *Deleted

## 2021-08-29 ENCOUNTER — Other Ambulatory Visit: Payer: Self-pay

## 2021-08-29 DIAGNOSIS — Z951 Presence of aortocoronary bypass graft: Secondary | ICD-10-CM | POA: Insufficient documentation

## 2021-08-29 DIAGNOSIS — Z7901 Long term (current) use of anticoagulants: Secondary | ICD-10-CM | POA: Insufficient documentation

## 2021-08-29 DIAGNOSIS — I5032 Chronic diastolic (congestive) heart failure: Secondary | ICD-10-CM | POA: Insufficient documentation

## 2021-08-29 DIAGNOSIS — I4891 Unspecified atrial fibrillation: Secondary | ICD-10-CM | POA: Insufficient documentation

## 2021-08-29 DIAGNOSIS — I2510 Atherosclerotic heart disease of native coronary artery without angina pectoris: Secondary | ICD-10-CM | POA: Insufficient documentation

## 2021-08-29 DIAGNOSIS — A419 Sepsis, unspecified organism: Secondary | ICD-10-CM | POA: Diagnosis not present

## 2021-08-29 DIAGNOSIS — W19XXXA Unspecified fall, initial encounter: Secondary | ICD-10-CM | POA: Insufficient documentation

## 2021-08-29 DIAGNOSIS — R4189 Other symptoms and signs involving cognitive functions and awareness: Secondary | ICD-10-CM

## 2021-08-29 DIAGNOSIS — R195 Other fecal abnormalities: Secondary | ICD-10-CM | POA: Insufficient documentation

## 2021-08-29 DIAGNOSIS — R443 Hallucinations, unspecified: Secondary | ICD-10-CM | POA: Insufficient documentation

## 2021-08-29 DIAGNOSIS — Z79899 Other long term (current) drug therapy: Secondary | ICD-10-CM | POA: Insufficient documentation

## 2021-08-29 DIAGNOSIS — I110 Hypertensive heart disease with heart failure: Secondary | ICD-10-CM | POA: Insufficient documentation

## 2021-08-29 LAB — URINALYSIS, ROUTINE W REFLEX MICROSCOPIC
Bilirubin Urine: NEGATIVE
Glucose, UA: NEGATIVE mg/dL
Ketones, ur: NEGATIVE mg/dL
Leukocytes,Ua: NEGATIVE
Nitrite: NEGATIVE
Protein, ur: 100 mg/dL — AB
Specific Gravity, Urine: 1.018 (ref 1.005–1.030)
pH: 6 (ref 5.0–8.0)

## 2021-08-29 LAB — COMPREHENSIVE METABOLIC PANEL
ALT: 26 U/L (ref 0–44)
AST: 19 U/L (ref 15–41)
Albumin: 3.5 g/dL (ref 3.5–5.0)
Alkaline Phosphatase: 58 U/L (ref 38–126)
Anion gap: 6 (ref 5–15)
BUN: 23 mg/dL (ref 8–23)
CO2: 38 mmol/L — ABNORMAL HIGH (ref 22–32)
Calcium: 9.4 mg/dL (ref 8.9–10.3)
Chloride: 96 mmol/L — ABNORMAL LOW (ref 98–111)
Creatinine, Ser: 0.87 mg/dL (ref 0.61–1.24)
GFR, Estimated: >60 mL/min (ref >60)
Glucose, Bld: 83 mg/dL (ref 70–99)
Potassium: 4.2 mmol/L (ref 3.5–5.1)
Sodium: 140 mmol/L (ref 135–145)
Total Bilirubin: 0.6 mg/dL (ref 0.3–1.2)
Total Protein: 6.5 g/dL (ref 6.5–8.1)

## 2021-08-29 LAB — CBC WITH DIFFERENTIAL/PLATELET
Abs Immature Granulocytes: 0.03 10*3/uL (ref 0.00–0.07)
Basophils Absolute: 0.1 10*3/uL (ref 0.0–0.1)
Basophils Relative: 1 %
Eosinophils Absolute: 0 10*3/uL (ref 0.0–0.5)
Eosinophils Relative: 0 %
HCT: 40.5 % (ref 39.0–52.0)
Hemoglobin: 12.4 g/dL — ABNORMAL LOW (ref 13.0–17.0)
Immature Granulocytes: 0 %
Lymphocytes Relative: 8 %
Lymphs Abs: 0.8 10*3/uL (ref 0.7–4.0)
MCH: 27.6 pg (ref 26.0–34.0)
MCHC: 30.6 g/dL (ref 30.0–36.0)
MCV: 90.2 fL (ref 80.0–100.0)
Monocytes Absolute: 1.1 10*3/uL — ABNORMAL HIGH (ref 0.1–1.0)
Monocytes Relative: 11 %
Neutro Abs: 8.3 10*3/uL — ABNORMAL HIGH (ref 1.7–7.7)
Neutrophils Relative %: 80 %
Platelets: 208 10*3/uL (ref 150–400)
RBC: 4.49 MIL/uL (ref 4.22–5.81)
RDW: 13.6 % (ref 11.5–15.5)
WBC: 10.3 10*3/uL (ref 4.0–10.5)
nRBC: 0 % (ref 0.0–0.2)

## 2021-08-29 LAB — I-STAT VENOUS BLOOD GAS, ED
Acid-Base Excess: 11 mmol/L — ABNORMAL HIGH (ref 0.0–2.0)
Bicarbonate: 37.9 mmol/L — ABNORMAL HIGH (ref 20.0–28.0)
Calcium, Ion: 1.18 mmol/L (ref 1.15–1.40)
HCT: 37 % — ABNORMAL LOW (ref 39.0–52.0)
Hemoglobin: 12.6 g/dL — ABNORMAL LOW (ref 13.0–17.0)
O2 Saturation: 49 %
Potassium: 4.5 mmol/L (ref 3.5–5.1)
Sodium: 139 mmol/L (ref 135–145)
TCO2: 40 mmol/L — ABNORMAL HIGH (ref 22–32)
pCO2, Ven: 57.3 mmHg (ref 44–60)
pH, Ven: 7.429 (ref 7.25–7.43)
pO2, Ven: 26 mmHg — CL (ref 32–45)

## 2021-08-29 LAB — POC OCCULT BLOOD, ED: Fecal Occult Bld: POSITIVE — AB

## 2021-08-29 MED ORDER — ZOLPIDEM TARTRATE 5 MG PO TABS: 5.0000 mg | ORAL_TABLET | Freq: Every evening | ORAL | 0 refills | Status: DC | PRN

## 2021-08-29 NOTE — ED Notes (Signed)
ED provider at bedside.

## 2021-08-29 NOTE — ED Notes (Signed)
Received verbal report from Connor L RN at this time 

## 2021-08-29 NOTE — ED Provider Triage Note (Signed)
Emergency Medicine Provider Triage Evaluation Note  Calvin Brown , a 88 y.o. male  was evaluated in triage.  Pt complains of recent fall.  Patient had a fall on Sunday.  Wife found him in the kitchen floor.  The next day she took him to see the orthopedist to check on his shoulder that he was having pain in.  He has not had imaging of his head but did hit his head during the fall and is on Xarelto.  She also reports that even prior to the fall over the past 2 weeks he has had a more gradual decline with increased sleepiness, incontinence and she is concerned.  He has history of a TBI and had similar symptoms and decline with that.  Patient is a retired Investment banker, operational  Review of Systems  Positive: Head injury, neck pain, incontinence, increased sleeping Negative: Chest pain, shortness of breath, numbness, weakness  Physical Exam  BP 121/79 (BP Location: Right Arm)   Pulse 84   Temp 97.6 F (36.4 C) (Oral)   Resp 16   SpO2 93%  Gen:   Awake, no distress, elderly gentleman, chronically ill-appearin Resp:  Normal effort  MSK:   Moves extremities without difficulty  Other:  No focal neurologic deficits  Medical Decision Making  Medically screening exam initiated at 1:13 PM.  Appropriate orders placed.  Calvin Brown was informed that the remainder of the evaluation will be completed by another provider, this initial triage assessment does not replace that evaluation, and the importance of remaining in the ED until their evaluation is complete.  Gradual decline over the past 2 weeks but had a fall on Sunday with head injury, on blood thinners, it has been several days since injury, patient with no focal neurologic deficits so will not activate level trauma.  CT of the head and cervical spine as well as chest x-ray ordered as well as basic lab work.   Dartha Lodge, [place] 08/29/21 1325

## 2021-08-29 NOTE — Patient Outreach (Signed)
  Care Coordination   Follow Up Visit Note   08/29/2021 Name: Calvin Brown MRN: [id number] DOB: 25-Apr-1933  Calvin Brown is a 88 y.o. year old male who sees Calvin Brown, [place] for primary care. I spoke with  Mrs. by phone today.   What matters to the patients health and wellness today?  Pt is in ED for multiple issues including a fall in which he hit his head and injured his shoulder. He has had increasing somnolence and weakness.    Goals Addressed               This Visit's Progress     Patient Stated     Patient Stated (pt-stated)   Not on track     I will attend all my outpt therapy sessions and follow strict safety/fall precautions avoiding any falls over the next 30 days.  08/29/21 Pt had fall on Sunday 08/25/21 hitting shoulder and head. Was seen by ortho and now in ED being evaluated for possible TBI due to increased somnolence, weakness. He has continued to go to his therapy.      Other     COMPLETED: Resolving UTI and urinary incontinence.   On track     UTI is resolved.        SDOH assessments and interventions completed:  Yes  Care Coordination Interventions Activated:  Yes   Care Coordination Interventions:  Yes, provided  Listened to and supported wife, provided information on CT done which does not show any changes from his previous one in June.  Follow up plan: Follow up call scheduled for 1 week.    Encounter Outcome:  Pt. Visit Completed   [name], MSN, [facility] Gerontological Nurse Practitioner [facility] Care Management [phone]

## 2021-08-29 NOTE — ED Provider Notes (Signed)
[facility] EMERGENCY DEPARTMENT Provider Note   CSN: [id number] Arrival date & time: 08/29/21  1249     History  Chief Complaint  Patient presents with   Calvin Brown is a 88 y.o. male.  Presented to ER due to multiple different concerns.  Patient had a fall on Sunday, hit his shoulder.  Also may have hit head.  He is on Xarelto.  Went to an orthopedic doctor for evaluation of the left shoulder and they did an x-ray and was told that the x-ray was negative.  Wife at bedside reports that patient has had significant cognitive and functional decline over the past couple weeks, worse since the fall a few days ago.  Has been generally sleepy.  Wife also reports that he has had some increased hallucinations.  Patient reports visual hallucinations.  No command hallucinations.  Has also had bedwetting at night.  Wife reports that he was having a lot of the symptoms back in April and they seem to have gotten better but seem lately to be getting worse.  No blood in stool, did have slightly dark stool a couple days ago, no bowel movements yesterday or today.  Reviewed discharge summary from April - stroke with ambulatory function, A-fib on Xarelto, CAD/CABG, GI bleed secondary to gastric AVM s/p cauterization 123456, chronic diastolic CHF, pulmonary fibrosis, HTN. Found to have a small SDH. Repeat head ct with worsening SDH. Treated with MMA embolization by Nundkumar.   Patient is a retired Doctor, general practice.  HPI     Home Medications Prior to Admission medications   Medication Sig Start Date End Date Taking? Authorizing Provider  zolpidem (AMBIEN) 5 MG tablet Take 1 tablet (5 mg total) by mouth at bedtime as needed for sleep. 08/29/21  Yes Lucrezia Starch, MD  acetaminophen (TYLENOL) 325 MG tablet Take 1-2 tablets (325-650 mg total) by mouth every 4 (four) hours as needed for mild pain. 05/01/21   Love, Ivan Anchors, PA-C  AMBULATORY NON FORMULARY MEDICATION Medication Name:  Diltiazem ointment - Using your index finger, apply a small amount of medication inside the rectum up to your first knuckle/joint three times daily x 6-8 weeks. 05/23/21   Levin Erp, PA  Cholecalciferol (VITAMIN D) 125 MCG (5000 UT) CAPS Take 5,000 Units by mouth in the morning.    [provider]  diltiazem 2 % GEL Place 1 application. rectally 3 (three) times daily as needed (ANAL FISSURE PAIN.). (PATIENT PRESCRIPTION IS FOR OINTMENT) Using your index finger, apply a small amount inside the rectum up to your first knuckle/joint three times daily x6-8 weeks. 05/23/21   [provider]  ezetimibe (ZETIA) 10 MG tablet Take 10 mg by mouth daily.    [provider]  famotidine (PEPCID) 20 MG tablet Take 20 mg by mouth every 8 (eight) hours.    [provider]  fluticasone furoate-vilanterol (BREO ELLIPTA) 100-25 MCG/ACT AEPB Inhale 1 puff into the lungs daily. 07/08/21   Margaretha Seeds, MD  ibandronate (BONIVA) 150 MG tablet Take 150 mg by mouth every 30 (thirty) days. 06/03/21   [provider]  linaclotide Rolan Lipa) 145 MCG CAPS capsule Take 1 capsule (145 mcg total) by mouth daily before breakfast. 05/23/21   Levin Erp, PA  lisinopril (ZESTRIL) 20 MG tablet TAKE 1 TABLET BY MOUTH EVERY DAY IN THE MORNING 08/08/21   Tolia, Sunit, DO  Melatonin 10 MG TABS Take 10 mg by mouth at bedtime.  [provider]  Multiple Vitamin (MULTIVITAMIN WITH MINERALS) TABS tablet Take 1 tablet by mouth in the morning.    [provider]  omeprazole (PRILOSEC) 20 MG capsule Take 1 capsule (20 mg total) by mouth daily. 06/05/21   Tolia, Sunit, DO  Pirfenidone (ESBRIET) 267 MG TABS Take 1 tab three times daily for 7 days, then 2 tabs three times daily for 7 days, then 3 tabs three times daily thereafter. 07/22/21   Luciano Cutter, MD  Pirfenidone (ESBRIET) 267 MG TABS Take 3 tablets (801 mg total) by mouth in the morning, at noon, and at  bedtime. 08/21/21   Luciano Cutter, MD  Potassium 99 MG TABS Take 198 mg by mouth in the morning.    [provider]  rivaroxaban (XARELTO) 20 MG TABS tablet Take 1 tablet (20 mg total) by mouth daily with supper. 08/05/21   Tolia, Sunit, DO  rosuvastatin (CRESTOR) 20 MG tablet Take 1 tablet (20 mg total) by mouth at bedtime for 90 doses. 01/30/21 06/22/23  Tolia, Sunit, DO  tamsulosin (FLOMAX) 0.4 MG CAPS capsule Take 2 capsules (0.8 mg total) by mouth daily. Patient taking differently: Take 0.4 mg by mouth in the morning and at bedtime. 04/25/21   Lorin Glass, MD  torsemide (DEMADEX) 5 MG tablet Take 1 tablet (5 mg total) by mouth in the morning. 08/05/21   Tolia, Sunit, DO      Allergies    Patient has no known allergies.    Review of Systems   Review of Systems  Constitutional:  Positive for fatigue. Negative for chills and fever.  HENT:  Negative for ear pain and sore throat.   Eyes:  Negative for pain and visual disturbance.  Respiratory:  Negative for cough and shortness of breath.   Cardiovascular:  Negative for chest pain and palpitations.  Gastrointestinal:  Negative for abdominal pain and vomiting.  Genitourinary:  Negative for dysuria and hematuria.  Musculoskeletal:  Negative for arthralgias and back pain.  Skin:  Negative for color change and rash.  Neurological:  Positive for weakness. Negative for seizures and syncope.  All other systems reviewed and are negative.   Physical Exam Updated Vital Signs BP 127/74 (BP Location: Right Arm)   Pulse 93   Temp 98.5 F (36.9 C) (Oral)   Resp 18   Ht 5\' 11"  (1.803 m)   Wt 75.9 kg   SpO2 95%   BMI 23.35 kg/m  Physical Exam Vitals and nursing note reviewed.  Constitutional:      General: He is not in acute distress.    Appearance: He is well-developed.     Comments: Elderly, frail  HENT:     Head: Normocephalic and atraumatic.  Eyes:     Conjunctiva/sclera: Conjunctivae normal.  Cardiovascular:     Rate and  Rhythm: Normal rate and regular rhythm.     Heart sounds: No murmur heard. Pulmonary:     Effort: Pulmonary effort is normal. No respiratory distress.     Breath sounds: Normal breath sounds.  Abdominal:     Palpations: Abdomen is soft.     Tenderness: There is no abdominal tenderness.  Musculoskeletal:        General: No swelling.     Cervical back: Neck supple.  Skin:    General: Skin is warm and dry.     Capillary Refill: Capillary refill takes less than 2 seconds.  Neurological:     Mental Status: He is alert.  Comments: AAOx3 CN 2-12 intact, speech clear visual fields intact 5/5 strength in b/l UE and LE Sensation to light touch intact in b/l UE and LE Normal FNF  Alert, answers most questions appropriately, oriented to person place and time, struggles with some complex questioning  Psychiatric:        Mood and Affect: Mood normal.     ED Results / Procedures / Treatments   Labs (all labs ordered are listed, but only abnormal results are displayed) Labs Reviewed  COMPREHENSIVE METABOLIC PANEL - Abnormal; Notable for the following components:      Result Value   Chloride 96 (*)    CO2 38 (*)    All other components within normal limits  CBC WITH DIFFERENTIAL/PLATELET - Abnormal; Notable for the following components:   Hemoglobin 12.4 (*)    Neutro Abs 8.3 (*)    Monocytes Absolute 1.1 (*)    All other components within normal limits  URINALYSIS, ROUTINE W REFLEX MICROSCOPIC - Abnormal; Notable for the following components:   APPearance HAZY (*)    Hgb urine dipstick SMALL (*)    Protein, ur 100 (*)    Bacteria, UA RARE (*)    Non Squamous Epithelial 0-5 (*)    All other components within normal limits  I-STAT VENOUS BLOOD GAS, ED - Abnormal; Notable for the following components:   pO2, Ven 26 (*)    Bicarbonate 37.9 (*)    TCO2 40 (*)    Acid-Base Excess 11.0 (*)    HCT 37.0 (*)    Hemoglobin 12.6 (*)    All other components within normal limits  POC  OCCULT BLOOD, ED - Abnormal; Notable for the following components:   Fecal Occult Bld POSITIVE (*)    All other components within normal limits    EKG EKG Interpretation  Date/Time:  Thursday August 29 2021 13:17:37 EDT Ventricular Rate:  93 PR Interval:    QRS Duration: 96 QT Interval:  350 QTC Calculation: 435 R Axis:   74 Text Interpretation: Atrial fibrillation Abnormal ECG When compared with ECG of 21-Apr-2021 16:17, No significant change since last tracing Confirmed by Marianna Fuss ([id number]) on 08/29/2021 5:06:47 PM  Radiology CT Head Wo Contrast  Result Date: 08/29/2021 CLINICAL DATA:  Head trauma, minor (Age >= 65y); Neck trauma (Age >= 65y) EXAM: CT HEAD WITHOUT CONTRAST CT CERVICAL SPINE WITHOUT CONTRAST TECHNIQUE: Multidetector CT imaging of the head and cervical spine was performed following the standard protocol without intravenous contrast. Multiplanar CT image reconstructions of the cervical spine were also generated. RADIATION DOSE REDUCTION: This exam was performed according to the departmental dose-optimization program which includes automated exposure control, adjustment of the mA and/or kV according to patient size and/or use of iterative reconstruction technique. COMPARISON:  Head CT 07/10/2021, cervical spine CT 04/15/2021. FINDINGS: CT HEAD FINDINGS Brain: There is no evidence of acute intracranial hemorrhage. Unchanged old right frontal lobe infarct.There is no acute extra-axial collection.There is a minimal residual low-density left frontal subdural collection, which is unchanged from prior exam.Unchanged additional mild sequela of chronic small vessel ischemic disease. Vascular: No hyperdense vessel. Previous left-sided middle meningeal artery embolization. Skull: Negative for skull fracture. Sinuses/Orbits: Mild ethmoid air cell mucosal thickening. Mastoid air cells are clear. Orbits are unremarkable. Other: None. CT CERVICAL SPINE FINDINGS Motion degraded exam.  Alignment: Unchanged degenerative grade 1 anterolisthesis at C4-C5. Skull base and vertebrae: There is no acute cervical spine fracture. There is no aggressive osseous lesion. Soft tissues and spinal canal:  No prevertebral fluid or swelling. No visible canal hematoma. Disc levels: There is moderate to severe degenerative disc disease at C4-C5 and C5-C6. There is moderate severe multilevel facet arthropathy, worse on the left. Upper chest: Negative. Other: None. IMPRESSION: Minimal residual density left frontal subdural collection consistent with chronic hygroma, unchanged from prior exam. No acute intracranial abnormality. Unchanged old right frontal lobe infarct. No acute cervical spine fracture. Multi level degenerative disc disease, worst at C4-C5 and C5-C6. Electronically Signed   By: Maurine Simmering M.D.   On: 08/29/2021 14:19   CT Cervical Spine Wo Contrast  Result Date: 08/29/2021 CLINICAL DATA:  Head trauma, minor (Age >= 65y); Neck trauma (Age >= 65y) EXAM: CT HEAD WITHOUT CONTRAST CT CERVICAL SPINE WITHOUT CONTRAST TECHNIQUE: Multidetector CT imaging of the head and cervical spine was performed following the standard protocol without intravenous contrast. Multiplanar CT image reconstructions of the cervical spine were also generated. RADIATION DOSE REDUCTION: This exam was performed according to the departmental dose-optimization program which includes automated exposure control, adjustment of the mA and/or kV according to patient size and/or use of iterative reconstruction technique. COMPARISON:  Head CT 07/10/2021, cervical spine CT 04/15/2021. FINDINGS: CT HEAD FINDINGS Brain: There is no evidence of acute intracranial hemorrhage. Unchanged old right frontal lobe infarct.There is no acute extra-axial collection.There is a minimal residual low-density left frontal subdural collection, which is unchanged from prior exam.Unchanged additional mild sequela of chronic small vessel ischemic disease. Vascular:  No hyperdense vessel. Previous left-sided middle meningeal artery embolization. Skull: Negative for skull fracture. Sinuses/Orbits: Mild ethmoid air cell mucosal thickening. Mastoid air cells are clear. Orbits are unremarkable. Other: None. CT CERVICAL SPINE FINDINGS Motion degraded exam. Alignment: Unchanged degenerative grade 1 anterolisthesis at C4-C5. Skull base and vertebrae: There is no acute cervical spine fracture. There is no aggressive osseous lesion. Soft tissues and spinal canal: No prevertebral fluid or swelling. No visible canal hematoma. Disc levels: There is moderate to severe degenerative disc disease at C4-C5 and C5-C6. There is moderate severe multilevel facet arthropathy, worse on the left. Upper chest: Negative. Other: None. IMPRESSION: Minimal residual density left frontal subdural collection consistent with chronic hygroma, unchanged from prior exam. No acute intracranial abnormality. Unchanged old right frontal lobe infarct. No acute cervical spine fracture. Multi level degenerative disc disease, worst at C4-C5 and C5-C6. Electronically Signed   By: Maurine Simmering M.D.   On: 08/29/2021 14:19   DG Chest 2 View  Result Date: 08/29/2021 CLINICAL DATA:  Chest pain after fall. EXAM: CHEST - 2 VIEW COMPARISON:  April 21, 2021. FINDINGS: The heart size and mediastinal contours are within normal limits. Status post coronary bypass graft. Minimal bibasilar subsegmental atelectasis is noted. The visualized skeletal structures are unremarkable. IMPRESSION: Minimal bibasilar subsegmental atelectasis. Electronically Signed   By: Marijo Conception M.D.   On: 08/29/2021 13:45    Procedures Procedures    Medications Ordered in ED Medications - No data to display  ED Course/ Medical Decision Making/ A&P                           Medical Decision Making Risk Prescription drug management.   88 year old man PMH of SDH, stroke, A-fib on Xarelto, CAD/CABG, GI bleed secondary to gastric AVM s/p  cauterization 123456, chronic diastolic CHF, pulmonary fibrosis, HTN presenting for evaluation after fall as well as increasing general fatigue, sleepiness, hallucinations.  On my evaluation patient appears frail, elderly but in no acute  distress.  He is able to answer most questions appropriately but does seem to struggle with some complex questioning.  No focal deficits were appreciated on my exam.  CT head was obtained, no acute traumatic pathology identified.  No evidence for acute stroke on CT today.  Electrolytes reviewed, no significant derangements noted.  He did endorse slightly dark stool a couple days ago, check rectal exam.  His stool was brown but Hemoccult positive.  Notably his hemoglobin today is 12.4 which is the best his hemoglobin has been over the past few months.  I discussed with Dr. Loletha Carrow with [facility] gastroenterology who is very familiar with patient.  He states given the current hemoglobin level he does not feel patient needs any further work-up or change in management at this time and does not require admission for this finding.  I discussed the case in detail with our neurologist on-call, Dr. Quinn Axe to see if she felt patient required any additional neurologic work-up or admission.  Given the nonfocal neurologic exam, negative head CT and symptomatology, she does not feel patient needs any additional work-up at this time but would greatly benefit from outpatient neurology follow-up.  She feels these symptoms may be related to the brain injury that patient suffered a few months ago.  Reassessed patient, remains well-appearing with normal vital signs.  I had lengthy discussion regarding need for follow-up with his primary care doctor as well as with neurology.  Patient has been going to outpatient physical therapy.  I do believe patient would benefit from home health aide if this is possible.  Placed consult to [facility] and placed home health face-to-face orders.  After the discussed management  above, the patient was determined to be safe for discharge.  The patient was in agreement with this plan and all questions regarding their care were answered.  ED return precautions were discussed and the patient will return to the ED with any significant worsening of condition.         Final Clinical Impression(s) / ED Diagnoses Final diagnoses:  Hallucinations  Cognitive change  Fall, initial encounter  Heme positive stool    Rx / DC Orders ED Discharge Orders          Ordered    zolpidem (AMBIEN) 5 MG tablet  At bedtime PRN        08/29/21 2026    Ambulatory referral to Neurology       Comments: An appointment is requested in approximately: 1 week   08/29/21 2027              Lucrezia Starch, MD 08/29/21 [phone]

## 2021-08-29 NOTE — ED Triage Notes (Signed)
Pt had an unwitnessed fall at home on Sunday 7/30 found by wife laying in the kitchen. Pt is on xarelto. Wife brought patient to orthopedics office because patient was having L shoulder pain to have this xrayed, patient was cleared and sent home from orthos office. Per the wife the patient has been declining w/ increased hallucinations, bed wetting, and increasing weakness and has not improved at all. Patient w/ recent brain bleed from a fall in march but was cleared by neuro since then.

## 2021-08-29 NOTE — Discharge Instructions (Addendum)
Please follow-up with your primary care doctor and with neurology.  If you have any increasing confusion, falls, fevers, difficulty breathing or other new concerning symptom, please return to ER for reassessment.  For sleep can try Ambien as needed.

## 2021-09-01 ENCOUNTER — Inpatient Hospital Stay (HOSPITAL_COMMUNITY)
Admission: EM | Admit: 2021-09-01 | Discharge: 2021-09-08 | DRG: 871 | Disposition: A | Discharge status: Home Health | Payer: Medicare Other | Attending: Internal Medicine | Admitting: Internal Medicine

## 2021-09-01 ENCOUNTER — Inpatient Hospital Stay (HOSPITAL_COMMUNITY): Payer: Medicare Other

## 2021-09-01 ENCOUNTER — Other Ambulatory Visit (HOSPITAL_COMMUNITY): Payer: Medicare Other

## 2021-09-01 ENCOUNTER — Emergency Department (HOSPITAL_COMMUNITY): Payer: Medicare Other

## 2021-09-01 DIAGNOSIS — Z9181 History of falling: Secondary | ICD-10-CM

## 2021-09-01 DIAGNOSIS — Z781 Physical restraint status: Secondary | ICD-10-CM

## 2021-09-01 DIAGNOSIS — Z87820 Personal history of traumatic brain injury: Secondary | ICD-10-CM

## 2021-09-01 DIAGNOSIS — I4819 Other persistent atrial fibrillation: Secondary | ICD-10-CM | POA: Diagnosis present

## 2021-09-01 DIAGNOSIS — N179 Acute kidney failure, unspecified: Secondary | ICD-10-CM | POA: Diagnosis present

## 2021-09-01 DIAGNOSIS — R4020 Unspecified coma: Secondary | ICD-10-CM | POA: Diagnosis not present

## 2021-09-01 DIAGNOSIS — B958 Unspecified staphylococcus as the cause of diseases classified elsewhere: Secondary | ICD-10-CM | POA: Diagnosis present

## 2021-09-01 DIAGNOSIS — J9601 Acute respiratory failure with hypoxia: Secondary | ICD-10-CM

## 2021-09-01 DIAGNOSIS — A419 Sepsis, unspecified organism: Secondary | ICD-10-CM | POA: Diagnosis present

## 2021-09-01 DIAGNOSIS — I21A1 Myocardial infarction type 2: Secondary | ICD-10-CM | POA: Diagnosis present

## 2021-09-01 DIAGNOSIS — K859 Acute pancreatitis without necrosis or infection, unspecified: Secondary | ICD-10-CM | POA: Diagnosis present

## 2021-09-01 DIAGNOSIS — Y92009 Unspecified place in unspecified non-institutional (private) residence as the place of occurrence of the external cause: Secondary | ICD-10-CM

## 2021-09-01 DIAGNOSIS — M7989 Other specified soft tissue disorders: Secondary | ICD-10-CM | POA: Diagnosis not present

## 2021-09-01 DIAGNOSIS — G9341 Metabolic encephalopathy: Secondary | ICD-10-CM | POA: Diagnosis present

## 2021-09-01 DIAGNOSIS — I472 Ventricular tachycardia, unspecified: Secondary | ICD-10-CM | POA: Diagnosis present

## 2021-09-01 DIAGNOSIS — G4709 Other insomnia: Secondary | ICD-10-CM | POA: Diagnosis present

## 2021-09-01 DIAGNOSIS — R9431 Abnormal electrocardiogram [ECG] [EKG]: Secondary | ICD-10-CM | POA: Diagnosis present

## 2021-09-01 DIAGNOSIS — N390 Urinary tract infection, site not specified: Secondary | ICD-10-CM | POA: Diagnosis present

## 2021-09-01 DIAGNOSIS — M25512 Pain in left shoulder: Secondary | ICD-10-CM | POA: Diagnosis present

## 2021-09-01 DIAGNOSIS — I5082 Biventricular heart failure: Secondary | ICD-10-CM | POA: Diagnosis present

## 2021-09-01 DIAGNOSIS — R441 Visual hallucinations: Secondary | ICD-10-CM | POA: Diagnosis present

## 2021-09-01 DIAGNOSIS — I272 Pulmonary hypertension, unspecified: Secondary | ICD-10-CM | POA: Diagnosis present

## 2021-09-01 DIAGNOSIS — J42 Unspecified chronic bronchitis: Secondary | ICD-10-CM | POA: Diagnosis present

## 2021-09-01 DIAGNOSIS — M21372 Foot drop, left foot: Secondary | ICD-10-CM | POA: Diagnosis present

## 2021-09-01 DIAGNOSIS — J84112 Idiopathic pulmonary fibrosis: Secondary | ICD-10-CM | POA: Diagnosis present

## 2021-09-01 DIAGNOSIS — E875 Hyperkalemia: Secondary | ICD-10-CM | POA: Diagnosis present

## 2021-09-01 DIAGNOSIS — R609 Edema, unspecified: Secondary | ICD-10-CM | POA: Diagnosis not present

## 2021-09-01 DIAGNOSIS — G3183 Dementia with Lewy bodies: Secondary | ICD-10-CM | POA: Diagnosis not present

## 2021-09-01 DIAGNOSIS — R680 Hypothermia, not associated with low environmental temperature: Secondary | ICD-10-CM | POA: Diagnosis present

## 2021-09-01 DIAGNOSIS — Z8673 Personal history of transient ischemic attack (TIA), and cerebral infarction without residual deficits: Secondary | ICD-10-CM

## 2021-09-01 DIAGNOSIS — J9602 Acute respiratory failure with hypercapnia: Secondary | ICD-10-CM | POA: Diagnosis not present

## 2021-09-01 DIAGNOSIS — I5032 Chronic diastolic (congestive) heart failure: Secondary | ICD-10-CM | POA: Diagnosis present

## 2021-09-01 DIAGNOSIS — Z87442 Personal history of urinary calculi: Secondary | ICD-10-CM

## 2021-09-01 DIAGNOSIS — J9600 Acute respiratory failure, unspecified whether with hypoxia or hypercapnia: Secondary | ICD-10-CM

## 2021-09-01 DIAGNOSIS — Z7901 Long term (current) use of anticoagulants: Secondary | ICD-10-CM

## 2021-09-01 DIAGNOSIS — Z821 Family history of blindness and visual loss: Secondary | ICD-10-CM

## 2021-09-01 DIAGNOSIS — G9340 Encephalopathy, unspecified: Secondary | ICD-10-CM | POA: Diagnosis present

## 2021-09-01 DIAGNOSIS — M21371 Foot drop, right foot: Secondary | ICD-10-CM | POA: Diagnosis present

## 2021-09-01 DIAGNOSIS — I110 Hypertensive heart disease with heart failure: Secondary | ICD-10-CM | POA: Diagnosis present

## 2021-09-01 DIAGNOSIS — J9612 Chronic respiratory failure with hypercapnia: Secondary | ICD-10-CM | POA: Diagnosis present

## 2021-09-01 DIAGNOSIS — I77810 Thoracic aortic ectasia: Secondary | ICD-10-CM | POA: Diagnosis present

## 2021-09-01 DIAGNOSIS — Z7951 Long term (current) use of inhaled steroids: Secondary | ICD-10-CM

## 2021-09-01 DIAGNOSIS — W19XXXA Unspecified fall, initial encounter: Secondary | ICD-10-CM | POA: Diagnosis present

## 2021-09-01 DIAGNOSIS — Z951 Presence of aortocoronary bypass graft: Secondary | ICD-10-CM

## 2021-09-01 DIAGNOSIS — I248 Other forms of acute ischemic heart disease: Secondary | ICD-10-CM | POA: Diagnosis not present

## 2021-09-01 DIAGNOSIS — Z79899 Other long term (current) drug therapy: Secondary | ICD-10-CM

## 2021-09-01 DIAGNOSIS — G20 Parkinson's disease: Secondary | ICD-10-CM | POA: Diagnosis present

## 2021-09-01 DIAGNOSIS — I2699 Other pulmonary embolism without acute cor pulmonale: Secondary | ICD-10-CM | POA: Diagnosis not present

## 2021-09-01 DIAGNOSIS — I2510 Atherosclerotic heart disease of native coronary artery without angina pectoris: Secondary | ICD-10-CM | POA: Diagnosis present

## 2021-09-01 DIAGNOSIS — J189 Pneumonia, unspecified organism: Secondary | ICD-10-CM | POA: Diagnosis present

## 2021-09-01 DIAGNOSIS — R6521 Severe sepsis with septic shock: Secondary | ICD-10-CM | POA: Diagnosis present

## 2021-09-01 DIAGNOSIS — E785 Hyperlipidemia, unspecified: Secondary | ICD-10-CM | POA: Diagnosis present

## 2021-09-01 DIAGNOSIS — E440 Moderate protein-calorie malnutrition: Secondary | ICD-10-CM | POA: Diagnosis present

## 2021-09-01 DIAGNOSIS — R269 Unspecified abnormalities of gait and mobility: Secondary | ICD-10-CM | POA: Diagnosis present

## 2021-09-01 DIAGNOSIS — Z87891 Personal history of nicotine dependence: Secondary | ICD-10-CM

## 2021-09-01 DIAGNOSIS — R7989 Other specified abnormal findings of blood chemistry: Secondary | ICD-10-CM | POA: Diagnosis present

## 2021-09-01 DIAGNOSIS — N3944 Nocturnal enuresis: Secondary | ICD-10-CM | POA: Diagnosis present

## 2021-09-01 DIAGNOSIS — Z803 Family history of malignant neoplasm of breast: Secondary | ICD-10-CM

## 2021-09-01 DIAGNOSIS — K759 Inflammatory liver disease, unspecified: Secondary | ICD-10-CM | POA: Diagnosis present

## 2021-09-01 DIAGNOSIS — Z6826 Body mass index (BMI) 26.0-26.9, adult: Secondary | ICD-10-CM

## 2021-09-01 DIAGNOSIS — E876 Hypokalemia: Secondary | ICD-10-CM | POA: Diagnosis not present

## 2021-09-01 DIAGNOSIS — F02A2 Dementia in other diseases classified elsewhere, mild, with psychotic disturbance: Secondary | ICD-10-CM | POA: Diagnosis not present

## 2021-09-01 DIAGNOSIS — I214 Non-ST elevation (NSTEMI) myocardial infarction: Secondary | ICD-10-CM | POA: Diagnosis not present

## 2021-09-01 DIAGNOSIS — Z8249 Family history of ischemic heart disease and other diseases of the circulatory system: Secondary | ICD-10-CM

## 2021-09-01 LAB — I-STAT VENOUS BLOOD GAS, ED
Acid-Base Excess: 7 mmol/L — ABNORMAL HIGH (ref 0.0–2.0)
Bicarbonate: 36.7 mmol/L — ABNORMAL HIGH (ref 20.0–28.0)
Calcium, Ion: 1.07 mmol/L — ABNORMAL LOW (ref 1.15–1.40)
HCT: 40 % (ref 39.0–52.0)
Hemoglobin: 13.6 g/dL (ref 13.0–17.0)
O2 Saturation: 98 %
Potassium: 5.5 mmol/L — ABNORMAL HIGH (ref 3.5–5.1)
Sodium: 138 mmol/L (ref 135–145)
TCO2: 39 mmol/L — ABNORMAL HIGH (ref 22–32)
pCO2, Ven: 82.2 mmHg (ref 44–60)
pH, Ven: 7.258 (ref 7.25–7.43)
pO2, Ven: 130 mmHg — ABNORMAL HIGH (ref 32–45)

## 2021-09-01 LAB — I-STAT ARTERIAL BLOOD GAS, ED
Acid-Base Excess: 3 mmol/L — ABNORMAL HIGH (ref 0.0–2.0)
Bicarbonate: 25.3 mmol/L (ref 20.0–28.0)
Calcium, Ion: 1.02 mmol/L — ABNORMAL LOW (ref 1.15–1.40)
HCT: 30 % — ABNORMAL LOW (ref 39.0–52.0)
Hemoglobin: 10.2 g/dL — ABNORMAL LOW (ref 13.0–17.0)
O2 Saturation: 99 %
Patient temperature: 98.6
Potassium: 4.7 mmol/L (ref 3.5–5.1)
Sodium: 141 mmol/L (ref 135–145)
TCO2: 26 mmol/L (ref 22–32)
pCO2 arterial: 30.4 mmHg — ABNORMAL LOW (ref 32–48)
pH, Arterial: 7.528 — ABNORMAL HIGH (ref 7.35–7.45)
pO2, Arterial: 106 mmHg (ref 83–108)

## 2021-09-01 LAB — BASIC METABOLIC PANEL
Anion gap: 7 (ref 5–15)
BUN: 36 mg/dL — ABNORMAL HIGH (ref 8–23)
CO2: 24 mmol/L (ref 22–32)
Calcium: 7.4 mg/dL — ABNORMAL LOW (ref 8.9–10.3)
Chloride: 109 mmol/L (ref 98–111)
Creatinine, Ser: 1.35 mg/dL — ABNORMAL HIGH (ref 0.61–1.24)
GFR, Estimated: 51 mL/min — ABNORMAL LOW (ref >60)
Glucose, Bld: 106 mg/dL — ABNORMAL HIGH (ref 70–99)
Potassium: 4 mmol/L (ref 3.5–5.1)
Sodium: 140 mmol/L (ref 135–145)

## 2021-09-01 LAB — URINALYSIS, ROUTINE W REFLEX MICROSCOPIC
Bilirubin Urine: NEGATIVE
Glucose, UA: NEGATIVE mg/dL
Ketones, ur: NEGATIVE mg/dL
Nitrite: NEGATIVE
Protein, ur: 100 mg/dL — AB
Specific Gravity, Urine: 1.02 (ref 1.005–1.030)
pH: 6 (ref 5.0–8.0)

## 2021-09-01 LAB — POCT I-STAT 7, (LYTES, BLD GAS, ICA,H+H)
Acid-Base Excess: 3 mmol/L — ABNORMAL HIGH (ref 0.0–2.0)
Bicarbonate: 27.3 mmol/L (ref 20.0–28.0)
Calcium, Ion: 1.1 mmol/L — ABNORMAL LOW (ref 1.15–1.40)
HCT: 35 % — ABNORMAL LOW (ref 39.0–52.0)
Hemoglobin: 11.9 g/dL — ABNORMAL LOW (ref 13.0–17.0)
O2 Saturation: 100 %
Patient temperature: 98.3
Potassium: 4.1 mmol/L (ref 3.5–5.1)
Sodium: 142 mmol/L (ref 135–145)
TCO2: 28 mmol/L (ref 22–32)
pCO2 arterial: 39.9 mmHg (ref 32–48)
pH, Arterial: 7.442 (ref 7.35–7.45)
pO2, Arterial: 220 mmHg — ABNORMAL HIGH (ref 83–108)

## 2021-09-01 LAB — I-STAT CHEM 8, ED
BUN: 53 mg/dL — ABNORMAL HIGH (ref 8–23)
Calcium, Ion: 1.05 mmol/L — ABNORMAL LOW (ref 1.15–1.40)
Chloride: 100 mmol/L (ref 98–111)
Creatinine, Ser: 1.9 mg/dL — ABNORMAL HIGH (ref 0.61–1.24)
Glucose, Bld: 130 mg/dL — ABNORMAL HIGH (ref 70–99)
HCT: 41 % (ref 39.0–52.0)
Hemoglobin: 13.9 g/dL (ref 13.0–17.0)
Potassium: 5.5 mmol/L — ABNORMAL HIGH (ref 3.5–5.1)
Sodium: 138 mmol/L (ref 135–145)
TCO2: 35 mmol/L — ABNORMAL HIGH (ref 22–32)

## 2021-09-01 LAB — TRIGLYCERIDES: Triglycerides: 24 mg/dL (ref <150)

## 2021-09-01 LAB — COMPREHENSIVE METABOLIC PANEL
ALT: 928 U/L — ABNORMAL HIGH (ref 0–44)
AST: 795 U/L — ABNORMAL HIGH (ref 15–41)
Albumin: 3.2 g/dL — ABNORMAL LOW (ref 3.5–5.0)
Alkaline Phosphatase: 66 U/L (ref 38–126)
Anion gap: 9 (ref 5–15)
BUN: 40 mg/dL — ABNORMAL HIGH (ref 8–23)
CO2: 32 mmol/L (ref 22–32)
Calcium: 8.6 mg/dL — ABNORMAL LOW (ref 8.9–10.3)
Chloride: 99 mmol/L (ref 98–111)
Creatinine, Ser: 1.81 mg/dL — ABNORMAL HIGH (ref 0.61–1.24)
GFR, Estimated: 36 mL/min — ABNORMAL LOW (ref >60)
Glucose, Bld: 132 mg/dL — ABNORMAL HIGH (ref 70–99)
Potassium: 5.6 mmol/L — ABNORMAL HIGH (ref 3.5–5.1)
Sodium: 140 mmol/L (ref 135–145)
Total Bilirubin: 0.8 mg/dL (ref 0.3–1.2)
Total Protein: 6.1 g/dL — ABNORMAL LOW (ref 6.5–8.1)

## 2021-09-01 LAB — CBC
HCT: 43.1 % (ref 39.0–52.0)
Hemoglobin: 12.8 g/dL — ABNORMAL LOW (ref 13.0–17.0)
MCH: 27.7 pg (ref 26.0–34.0)
MCHC: 29.7 g/dL — ABNORMAL LOW (ref 30.0–36.0)
MCV: 93.3 fL (ref 80.0–100.0)
Platelets: 190 10*3/uL (ref 150–400)
RBC: 4.62 MIL/uL (ref 4.22–5.81)
RDW: 13.6 % (ref 11.5–15.5)
WBC: 10.4 10*3/uL (ref 4.0–10.5)
nRBC: 0 % (ref 0.0–0.2)

## 2021-09-01 LAB — GLUCOSE, CAPILLARY
Glucose-Capillary: 163 mg/dL — ABNORMAL HIGH (ref 70–99)
Glucose-Capillary: 109 mg/dL — ABNORMAL HIGH (ref 70–99)
Glucose-Capillary: 96 mg/dL (ref 70–99)

## 2021-09-01 LAB — AMMONIA: Ammonia: 15 umol/L (ref 9–35)

## 2021-09-01 LAB — HEPATITIS PANEL, ACUTE
HCV Ab: NONREACTIVE
Hep A IgM: NONREACTIVE
Hep B C IgM: NONREACTIVE
Hepatitis B Surface Ag: NONREACTIVE

## 2021-09-01 LAB — PROTIME-INR
INR: 1.8 — ABNORMAL HIGH (ref 0.8–1.2)
Prothrombin Time: 20.9 seconds — ABNORMAL HIGH (ref 11.4–15.2)

## 2021-09-01 LAB — TROPONIN I (HIGH SENSITIVITY)
Troponin I (High Sensitivity): 508 ng/L (ref <18)
Troponin I (High Sensitivity): 2111 ng/L (ref <18)
Troponin I (High Sensitivity): 3384 ng/L (ref <18)

## 2021-09-01 LAB — LACTIC ACID, PLASMA
Lactic Acid, Venous: 1.5 mmol/L (ref 0.5–1.9)
Lactic Acid, Venous: 2 mmol/L (ref 0.5–1.9)
Lactic Acid, Venous: 1.8 mmol/L (ref 0.5–1.9)
Lactic Acid, Venous: 1.3 mmol/L (ref 0.5–1.9)

## 2021-09-01 LAB — PROCALCITONIN: Procalcitonin: 0.26 ng/mL

## 2021-09-01 LAB — URINALYSIS, MICROSCOPIC (REFLEX)
Bacteria, UA: NONE SEEN
RBC / HPF: >50 RBC/hpf (ref 0–5)
WBC, UA: >50 WBC/hpf (ref 0–5)

## 2021-09-01 LAB — POCT ACTIVATED CLOTTING TIME: Activated Clotting Time: 0 seconds

## 2021-09-01 LAB — MAGNESIUM: Magnesium: 1.9 mg/dL (ref 1.7–2.4)

## 2021-09-01 LAB — MRSA NEXT GEN BY PCR, NASAL: MRSA by PCR Next Gen: NOT DETECTED

## 2021-09-01 LAB — CREATININE, URINE, RANDOM: Creatinine, Urine: 48 mg/dL

## 2021-09-01 LAB — SAMPLE TO BLOOD BANK

## 2021-09-01 LAB — PHOSPHORUS: Phosphorus: 4.4 mg/dL (ref 2.5–4.6)

## 2021-09-01 LAB — SODIUM, URINE, RANDOM: Sodium, Ur: 24 mmol/L

## 2021-09-01 LAB — ETHANOL: Alcohol, Ethyl (B): <10 mg/dL (ref <10)

## 2021-09-01 LAB — LIPASE, BLOOD: Lipase: 27 U/L (ref 11–51)

## 2021-09-01 LAB — CORTISOL: Cortisol, Plasma: 30.4 ug/dL

## 2021-09-01 MED ORDER — VANCOMYCIN HCL 1500 MG/300ML IV SOLN
1500.0000 mg | Freq: Once | INTRAVENOUS | Status: AC
  Administered 2021-09-01: 1500 mg via INTRAVENOUS
  Filled 2021-09-01: qty 300

## 2021-09-01 MED ORDER — MIDAZOLAM HCL 2 MG/2ML IJ SOLN
2.0000 mg | Freq: Once | INTRAMUSCULAR | Status: AC
Start: 2021-09-01 — End: 2021-09-01
  Administered 2021-09-01: 1 mg via INTRAVENOUS

## 2021-09-01 MED ORDER — SODIUM CHLORIDE 0.9 % IV BOLUS
1000.0000 mL | Freq: Once | INTRAVENOUS | Status: AC
  Administered 2021-09-01: 1000 mL via INTRAVENOUS

## 2021-09-01 MED ORDER — HEPARIN (PORCINE) 25000 UT/250ML-% IV SOLN
1150.0000 [IU]/h | INTRAVENOUS | Status: DC
  Administered 2021-09-01: 1150 [IU]/h via INTRAVENOUS
  Filled 2021-09-01: qty 250

## 2021-09-01 MED ORDER — CHLORHEXIDINE GLUCONATE CLOTH 2 % EX PADS
6.0000 | MEDICATED_PAD | Freq: Every day | CUTANEOUS | Status: DC
Start: 2021-09-02 — End: 2021-09-06
  Administered 2021-09-02 – 2021-09-05 (×4): 6 via TOPICAL

## 2021-09-01 MED ORDER — SODIUM CHLORIDE 0.9 % IV BOLUS: 1000.0000 mL | Freq: Once | INTRAVENOUS | Status: DC

## 2021-09-01 MED ORDER — NOREPINEPHRINE 4 MG/250ML-% IV SOLN: 2.0000 ug/min | INTRAVENOUS | Status: DC

## 2021-09-01 MED ORDER — ETOMIDATE 2 MG/ML IV SOLN
INTRAVENOUS | Status: AC | PRN
  Administered 2021-09-01: 20 mg via INTRAVENOUS

## 2021-09-01 MED ORDER — MIDAZOLAM HCL 2 MG/2ML IJ SOLN
1.0000 mg | INTRAMUSCULAR | Status: DC | PRN
  Filled 2021-09-01 (×2): qty 2

## 2021-09-01 MED ORDER — PHENYLEPHRINE HCL (PRESSORS) 10 MG/ML IV SOLN
INTRAVENOUS | Status: AC | PRN
  Administered 2021-09-01: 160 ug via INTRAVENOUS

## 2021-09-01 MED ORDER — PANTOPRAZOLE 2 MG/ML SUSPENSION
40.0000 mg | Freq: Every day | ORAL | Status: DC
  Administered 2021-09-02: 40 mg
  Filled 2021-09-01 (×3): qty 20

## 2021-09-01 MED ORDER — VANCOMYCIN HCL 1500 MG/300ML IV SOLN: 1500.0000 mg | INTRAVENOUS | Status: DC

## 2021-09-01 MED ORDER — DOCUSATE SODIUM 50 MG/5ML PO LIQD: 100.0000 mg | Freq: Two times a day (BID) | ORAL | Status: DC | PRN

## 2021-09-01 MED ORDER — ASPIRIN 325 MG PO TABS
325.0000 mg | ORAL_TABLET | Freq: Once | ORAL | Status: AC
  Administered 2021-09-01: 325 mg via NASOGASTRIC
  Filled 2021-09-01: qty 1

## 2021-09-01 MED ORDER — ATORVASTATIN CALCIUM 40 MG PO TABS
40.0000 mg | ORAL_TABLET | Freq: Once | ORAL | Status: AC
  Administered 2021-09-01: 40 mg via NASOGASTRIC
  Filled 2021-09-01: qty 1

## 2021-09-01 MED ORDER — POLYETHYLENE GLYCOL 3350 17 G PO PACK: 17.0000 g | PACK | Freq: Every day | ORAL | Status: DC | PRN

## 2021-09-01 MED ORDER — DEXMEDETOMIDINE HCL IN NACL 400 MCG/100ML IV SOLN
0.1000 ug/kg/h | INTRAVENOUS | Status: DC
  Administered 2021-09-01: 0.2 ug/kg/h via INTRAVENOUS
  Administered 2021-09-02 (×2): 0.7 ug/kg/h via INTRAVENOUS
  Filled 2021-09-01 (×3): qty 100

## 2021-09-01 MED ORDER — DOCUSATE SODIUM 100 MG PO CAPS: 100.0000 mg | ORAL_CAPSULE | Freq: Two times a day (BID) | ORAL | Status: DC | PRN

## 2021-09-01 MED ORDER — ARFORMOTEROL TARTRATE 15 MCG/2ML IN NEBU
15.0000 ug | INHALATION_SOLUTION | Freq: Two times a day (BID) | RESPIRATORY_TRACT | Status: DC
  Administered 2021-09-01 – 2021-09-08 (×14): 15 ug via RESPIRATORY_TRACT
  Filled 2021-09-01 (×14): qty 2

## 2021-09-01 MED ORDER — PIPERACILLIN-TAZOBACTAM 3.375 G IVPB 30 MIN
3.3750 g | Freq: Once | INTRAVENOUS | Status: AC
  Administered 2021-09-01: 3.375 g via INTRAVENOUS
  Filled 2021-09-01: qty 50

## 2021-09-01 MED ORDER — FENTANYL BOLUS VIA INFUSION
25.0000 ug | INTRAVENOUS | Status: DC | PRN
  Administered 2021-09-01: 50 ug via INTRAVENOUS
  Administered 2021-09-01: 100 ug via INTRAVENOUS
  Administered 2021-09-02: 50 ug via INTRAVENOUS
  Administered 2021-09-02: 25 ug via INTRAVENOUS
  Administered 2021-09-02: 50 ug via INTRAVENOUS
  Administered 2021-09-02: 25 ug via INTRAVENOUS

## 2021-09-01 MED ORDER — MIDAZOLAM HCL 2 MG/2ML IJ SOLN
1.0000 mg | INTRAMUSCULAR | Status: DC | PRN
  Administered 2021-09-01 – 2021-09-02 (×5): 1 mg via INTRAVENOUS
  Filled 2021-09-01 (×6): qty 2

## 2021-09-01 MED ORDER — NOREPINEPHRINE 4 MG/250ML-% IV SOLN
INTRAVENOUS | Status: AC | PRN
  Administered 2021-09-01: 5 ug/min via INTRAVENOUS

## 2021-09-01 MED ORDER — MAGNESIUM SULFATE 2 GM/50ML IV SOLN
2.0000 g | Freq: Once | INTRAVENOUS | Status: AC
  Administered 2021-09-01: 2 g via INTRAVENOUS
  Filled 2021-09-01: qty 50

## 2021-09-01 MED ORDER — FENTANYL 2500MCG IN NS 250ML (10MCG/ML) PREMIX INFUSION
25.0000 ug/h | INTRAVENOUS | Status: DC
  Administered 2021-09-01: 100 ug/h via INTRAVENOUS
  Administered 2021-09-02: 200 ug/h via INTRAVENOUS
  Filled 2021-09-01 (×3): qty 250

## 2021-09-01 MED ORDER — PIPERACILLIN-TAZOBACTAM 3.375 G IVPB
3.3750 g | Freq: Three times a day (TID) | INTRAVENOUS | Status: DC
  Administered 2021-09-01 – 2021-09-03 (×5): 3.375 g via INTRAVENOUS
  Filled 2021-09-01 (×6): qty 50

## 2021-09-01 MED ORDER — ORAL CARE MOUTH RINSE: 15.0000 mL | OROMUCOSAL | Status: DC | PRN

## 2021-09-01 MED ORDER — SODIUM CHLORIDE 0.9 % IV SOLN: 250.0000 mL | INTRAVENOUS | Status: DC

## 2021-09-01 MED ORDER — SODIUM CHLORIDE 0.9 % IV SOLN: INTRAVENOUS | Status: DC

## 2021-09-01 MED ORDER — ORAL CARE MOUTH RINSE
15.0000 mL | OROMUCOSAL | Status: DC
  Administered 2021-09-01 – 2021-09-03 (×20): 15 mL via OROMUCOSAL

## 2021-09-01 MED ORDER — PHENYLEPHRINE HCL (PRESSORS) 10 MG/ML IV SOLN
INTRAVENOUS | Status: AC | PRN
  Administered 2021-09-01 (×2): 160 ug via INTRAVENOUS

## 2021-09-01 MED ORDER — NOREPINEPHRINE 4 MG/250ML-% IV SOLN
0.0000 ug/min | INTRAVENOUS | Status: DC
  Administered 2021-09-01: 13 ug/min via INTRAVENOUS
  Administered 2021-09-01: 10 ug/min via INTRAVENOUS
  Administered 2021-09-02: 6 ug/min via INTRAVENOUS
  Administered 2021-09-04: 7 ug/min via INTRAVENOUS
  Filled 2021-09-01 (×5): qty 250

## 2021-09-01 MED ORDER — HEPARIN SODIUM (PORCINE) 5000 UNIT/ML IJ SOLN: 5000.0000 [IU] | Freq: Three times a day (TID) | INTRAMUSCULAR | Status: DC

## 2021-09-01 MED ORDER — CALCIUM GLUCONATE-NACL 1-0.675 GM/50ML-% IV SOLN
1.0000 g | Freq: Once | INTRAVENOUS | Status: AC
  Administered 2021-09-01: 1000 mg via INTRAVENOUS
  Filled 2021-09-01: qty 50

## 2021-09-01 MED ORDER — ROCURONIUM BROMIDE 50 MG/5ML IV SOLN
INTRAVENOUS | Status: AC | PRN
  Administered 2021-09-01: 100 mg via INTRAVENOUS

## 2021-09-01 MED ORDER — IPRATROPIUM-ALBUTEROL 0.5-2.5 (3) MG/3ML IN SOLN
3.0000 mL | Freq: Four times a day (QID) | RESPIRATORY_TRACT | Status: DC
  Administered 2021-09-01 – 2021-09-03 (×8): 3 mL via RESPIRATORY_TRACT
  Filled 2021-09-01 (×8): qty 3

## 2021-09-01 NOTE — ED Notes (Signed)
Bair hugger applied.

## 2021-09-01 NOTE — Significant Event (Signed)
Patient with ectopy. CXR with CVC in atrium. Cleaned with sterile technique and pulled back. New dressing and sutures applied.

## 2021-09-01 NOTE — Progress Notes (Addendum)
eLink Physician-Brief Progress Note Patient Name: Calvin Brown DOB: [birth year] MRN: [id number]   Date of Service  09/01/2021  HPI/Events of Note  Needs Bil Soft Wrist Restraints,   Short Run of VT, Has C-line, Is there a need to repeat K+  on vent  Camera eval done. In synchrony with Vent, PIP < 25. Sats good. On fenta,levo, heparin gtt.  LA 1.3 Troponin still going up. S/p 2 gm mag received per RN discussion.   ECHO from 2022 EF 55%.   NSTEMI: - on call Cardiology consult paged Dr Baldomero Lamy. - stat ASA and lipitor via NG tube. - already on heparin gtt. On pressors.   eICU Interventions  - discussed with RN.  - ion calcium, BMP  to check. - restraints ordered also.       Intervention Category Intermediate Interventions: Arrhythmia - evaluation and management Minor Interventions: Agitation / anxiety - evaluation and management  Ranee Gosselin 09/01/2021, 9:47 PM  22:36 Cardiologist called back. Agree with our current plan of care. They will see him in AM.   23:14 Ion calcium low at 1.10. - replace calcium  ABG: pH 7.44, hco3 at 27. Continue care

## 2021-09-01 NOTE — ED Notes (Signed)
This RN accessed pts chart to locate pt for IV team for IV access

## 2021-09-01 NOTE — Progress Notes (Signed)
Pharmacy Antibiotic Note  Calvin Brown is a 88 y.o. male admitted on 09/01/2021 with sepsis.  Pharmacy has been consulted for Zosyn and vancomycin dosing.   SCr elevated at 1.9 (BL ~ 0.87), WBC wnl.   Plan: -Start Vancomycin 1500 mg IV Q 48 hrs. Goal AUC 400-550. Expected AUC: 477 SCr used: 1.9  -Zosyn 3.375 gm IV Q 8 hours (EI infusion) -Monitor CBC, renal fx, cultures and clinical progress -Vanc level at steady state      Temp (24hrs), Avg:94.5 F (34.7 C), Min:92.8 F (33.8 C), Max:95.5 F (35.3 C)  Recent Labs  Lab 08/29/21 1334 09/01/21 1030 09/01/21 1041 09/01/21 1212  WBC 10.3 10.4  --   --   CREATININE 0.87 1.81* 1.90*  --   LATICACIDVEN  --  1.5  --  2.0*    Estimated Creatinine Clearance: 29.7 mL/min (A) (by C-G formula based on SCr of 1.9 mg/dL (H)).    No Known Allergies  Antimicrobials this admission: Zosyn 8/6 >>  Vancomycin 8/6 >>   Dose adjustments this admission:   Microbiology results: 8/6 BCx:  8/6 UCx:    Thank you for allowing pharmacy to be a part of this patient's care.  Vinnie Level, PharmD., BCCCP Clinical Pharmacist Please refer to [facility] for unit-specific pharmacist

## 2021-09-01 NOTE — Procedures (Signed)
Arterial Catheter Insertion Procedure Note  Janssen Zee  [id number]  19-May-1933  Date:09/01/21  Time:11:33 AM    Provider Performing: Ermalinda Memos E    Procedure: Insertion of Arterial Line (66063) without US guidance  Indication(s) Blood pressure monitoring and/or need for frequent ABGs  Consent Unable to obtain consent due to emergent nature of procedure.  Anesthesia None   Time Out Verified patient identification, verified procedure, site/side was marked, verified correct patient position, special equipment/implants available, medications/allergies/relevant history reviewed, required imaging and test results available.   Sterile Technique Maximal sterile technique including full sterile barrier drape, hand hygiene, sterile gown, sterile gloves, mask, hair covering, sterile ultrasound probe cover (if used).   Procedure Description Area of catheter insertion was cleaned with chlorhexidine and draped in sterile fashion. With real-time ultrasound guidance an arterial catheter was placed into the left radial artery.  Appropriate arterial tracings confirmed on monitor.     Complications/Tolerance None; patient tolerated the procedure well.   EBL Minimal   Specimen(s) None

## 2021-09-01 NOTE — ED Notes (Signed)
BP trending down, recent versed, increased levo

## 2021-09-01 NOTE — ED Notes (Signed)
PCCM at BS ?

## 2021-09-01 NOTE — Code Documentation (Signed)
RT and Adela Lank MD at bedside to complete intubation. Family aware of plan

## 2021-09-01 NOTE — ED Notes (Addendum)
EDP and pharm D present, RT placing A-line, EDP speaking with family, fentanyl infusing, titrating down on levo.

## 2021-09-01 NOTE — Progress Notes (Signed)
Transported to CT and back to ED 20 without complications

## 2021-09-01 NOTE — ED Notes (Signed)
Family x2 updated outside of room. VSS. Urine and ammonia sample sent

## 2021-09-01 NOTE — Progress Notes (Signed)
eLink Physician-Brief Progress Note Patient Name: Calvin Brown DOB: 03/09/1933 MRN: [id number]   Date of Service  09/01/2021  HPI/Events of Note  Camera: On fenta gtt. Gets agitated, pain ?. HR 86, a fib.   eICU Interventions  - low dose precedex gtt ordered, watch for bradycardia.      Intervention Category Intermediate Interventions: Other: Minor Interventions: Agitation / anxiety - evaluation and management  Ranee Gosselin 09/01/2021, 11:32 PM

## 2021-09-01 NOTE — Procedures (Signed)
History: 88 yo M being evaluated for acute encephalopathy  Sedation: fentanyl  Technique: This EEG was acquired with electrodes placed according to the International 10-20 electrode system (including Fp1, Fp2, F3, F4, C3, C4, P3, P4, O1, O2, T3, T4, T5, T6, A1, A2, Fz, Cz, Pz). The following electrodes were missing or displaced: none.   Background: The background consists of intermixed irregular delta and theta range activities. There are occiasional generalized discharges with triphasic morphology without evidence of evolution or other concerning features. No definite epileptiform discharges were seen. Posterior dominant rhythm was not visualized.   Photic stimulation: Physiologic driving is not performed.   EEG Abnormalities: 1) Triphasic waves 2) Generalized irregular slow activity 3) Absent PDR  Clinical Interpretation: This EEG is consistent with a generalized non-specific cerebral dysfunction(encephalopathy). There was no seizure or seizure predisposition recorded on this study. Please note that lack of epileptiform activity on EEG does not preclude the possibility of epilepsy.   Ritta Slot, MD [facility] [phone]  If 7pm- 7am, please page neurology on call as listed in AMION.

## 2021-09-01 NOTE — H&P (Signed)
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  Initially hypotensive with EMS, given fluids, placed on nonrebreather .  Intubated for airway protection due to low GCS, hypothermic, started on Levophed and broad-spectrum antibiotics Labs show WBC count of 10, lactic 1.5, mild hyperkalemia, new renal failure BUN/creatinine 40/1.8, elevated LFTs VBG was 7.2 6/82/130 Head CT showed previous subdural hematoma and almost resolved, moderate chronic right MCA infarct, small vessel ischemic disease CT abdomen showed stranding around the pancreas consistent with acute pancreatitis with small volume ascites and small effusions , no hydronephrosis  History obtained from wife and daughter and review of chart. He was started on pirfenidone about a month ago.  Recent UTI 2 weeks ago, started on Macrodantin by urology .  He had a fall at home on 7/30, complained of left shoulder pain, x-ray negative per orthopedics but since then his mental status has been declining with hallucinations, bedwetting and increasing weakness.  ED visit on 8/3 with negative head CT , VBG was 7.43/57 , no leukocytosis, normal renal function. Ambien was started 8/3  Pertinent  Medical History  CAD s/p CABG,  IPF on pirfenidone, follows with Dr. Everardo All Chronic bronchitis, remote smoker, on Breo 07/08/21 FVC 2.51 (65%) FEV1 1.97 (73%) Ratio 73  TLC 74% DLCO 54% Interpretation: Moderate restrictive defect with moderately reduced DLCO HTN,  CVA,  atrial fibrillation Xarelto  Subdural hematoma 03/2021 requiring embolization of meningeal artery  Significant Hospital Events: Including procedures, antibiotic start and stop dates in addition to other  pertinent events   7/30 fall  8/3 ED visit 8/6 admitted for unresponsiveness  Interim History / Subjective:  Critically ill, intubated, sedated with fentanyl Hypotensive on Levophed drip  Objective   Blood pressure 93/66, pulse (!) 108, temperature (!) 95.7 F (35.4 C), resp. rate 15, SpO2 94 %.    Vent Mode: PRVC FiO2 (%):  [60 %-100 %] 60 % Set Rate:  [15 bmp-20 bmp] 15 bmp Vt Set:  [600 mL] 600 mL PEEP:  [5 cmH20] 5 cmH20 Plateau Pressure:  [18 cmH20-19 cmH20] 18 cmH20   Intake/Output Summary (Last 24 hours) at 09/01/2021 1442 Last data filed at 09/01/2021 1248 Atienza per 24 hour  Intake 4050 ml  Output 265 ml  Net 3785 ml   There were no vitals filed for this visit.  Examination: General: Elderly man, orally intubated, no distress, sedated on fentanyl HENT: Mild pallor, no icterus pupils 3 mm bilaterally equally reactive to light Lungs: Clear breath sounds bilateral, no accessory muscle use Cardiovascular: S1-S2 regular, no murmur, midline sternotomy scar Abdomen: Soft, nontender, no guarding, no hepatosplenomegaly Extremities: No edema, no deformity Neuro: RASS -3, unresponsive GU: Clear urine  Chest x-ray independently reviewed shows ET tube and NG tube in position, bibasilar atelectasis, no infiltrates  Resolved Hospital Problem list     Assessment & Plan:  His encephalopathy with fluctuating mental status over the past 2 weeks seems to be metabolic, likely induced by medications, Ambien was introduced 8/3 Hepatitis can be attributed to pirfenidone, acute pancreatitis less likely with this medication. Hypotension raises the question of sepsis, UTI seems most likely with WBC clumps   Septic shock -normal lactate, using PIV Levophed for MAP 65 & above -Covering empirically with Zosyn/vancomycin  while awaiting cultures -check pct   Acute encephalopathy -multifactorial , medications, hypercarbia -Check ammonia for completion -Spot EEG to rule out seizure -May  need MRI -Using fentanyl drip for sedation with as needed Versed, once blood pressure improves can transition to Precedex, goal RASS 0 to -1   Acute respiratory failure ?  Chronic hypercarbic respiratory failure Underlying IPF , PFTs have not shown significant airway obstruction -Vent settings reviewed and adjusted he does not appear to have bronchospasm -Previous VBG shows elevated PCO2, with lower respiratory rate to avoid overcorrection  AKI -no hydronephrosis on CT Related to UTI versus sepsis -Check Fina -Avoid nephrotoxins -Hyperkalemia is mild, monitor bmet  Acute pancreatitis Elevated LFTs  -DC pirfenidone -N.p.o. -Check lipase  Best Practice (right click and "Reselect all SmartList Selections" daily)   Diet/type: NPO w/ meds via tube DVT prophylaxis: DOAC GI prophylaxis: PPI Lines: N/A Foley:  Yes, and it is still needed Code Status:  full code Last date of multidisciplinary goals of care discussion [NA] wife and daughter would like full code measures  Labs   CBC: Recent Labs  Lab 08/29/21 1334 08/29/21 1813 09/01/21 1030 09/01/21 1041 09/01/21 1230  WBC 10.3  --  10.4  --   --   NEUTROABS 8.3*  --   --   --   --   HGB 12.4* 12.6* 12.8* 13.6  13.9 10.2*  HCT 40.5 37.0* 43.1 40.0  41.0 30.0*  MCV 90.2  --  93.3  --   --   PLT 208  --  190  --   --     Basic Metabolic Panel: Recent Labs  Lab 08/29/21 1334 08/29/21 1813 09/01/21 1030 09/01/21 1041 09/01/21 1230  NA 140 139 140 138  138 141  K 4.2 4.5 5.6* 5.5*  5.5* 4.7  CL 96*  --  99 100  --   CO2 38*  --  32  --   --   GLUCOSE 83  --  132* 130*  --   BUN 23  --  40* 53*  --   CREATININE 0.87  --  1.81* 1.90*  --   CALCIUM 9.4  --  8.6*  --   --    GFR: Estimated Creatinine Clearance: 29.7 mL/min (A) (by C-G formula based on SCr of 1.9 mg/dL (H)). Recent Labs  Lab 08/29/21 1334 09/01/21 1030 09/01/21 1212  WBC 10.3 10.4  --   LATICACIDVEN  --  1.5 2.0*    Liver Function  Tests: Recent Labs  Lab 08/29/21 1334 09/01/21 1030  AST 19 795*  ALT 26 928*  ALKPHOS 58 66  BILITOT 0.6 0.8  PROT 6.5 6.1*  ALBUMIN 3.5 3.2*   No results for input(s): "LIPASE", "AMYLASE" in the last 168 hours. Recent Labs  Lab 09/01/21 1150  AMMONIA 15    ABG    Component Value Date/Time   PHART 7.528 (H) 09/01/2021 1230   PCO2ART 30.4 (L) 09/01/2021 1230   PO2ART 106 09/01/2021 1230   HCO3 25.3 09/01/2021 1230   TCO2 26 09/01/2021 1230   O2SAT 99 09/01/2021 1230     Coagulation Profile: Recent Labs  Lab 09/01/21 1030  INR 1.8*    Cardiac Enzymes: No results for input(s): "CKTOTAL", "CKMB", "CKMBINDEX", "TROPONINI" in the last 168 hours.  HbA1C: No results found for: "HGBA1C"  CBG: No results for input(s): "GLUCAP" in the last 168 hours.  Review of Systems:   Unable to obtain an unresponsive patient  Past Medical  History:  He,  has a past medical history of Atrial fibrillation (HCC), CAD (coronary artery disease), CVA (cerebral vascular accident) (HCC) (05/27/2013), GI bleed (05/27/2013), History of blood transfusion, History of kidney stones, HTN (hypertension), CABG, Hyperlipidemia, and Pneumonia.   Surgical History:   Past Surgical History:  Procedure Laterality Date   CORONARY ARTERY BYPASS GRAFT  1992   CYSTOSCOPY/URETEROSCOPY/HOLMIUM LASER/STENT PLACEMENT Left 07/02/2021   Procedure: LEFT URETEROSCOPY/HOLMIUM LASER retrograde pylegram left stent placement;  Surgeon: Despina Arias, MD;  Location: [facility];  Service: Urology;  Laterality: Left;   ESOPHAGOGASTRODUODENOSCOPY     IR ANGIO EXTERNAL CAROTID SEL EXT CAROTID UNI L MOD SED  05/03/2021   IR ANGIO INTRA EXTRACRAN SEL INTERNAL CAROTID UNI L MOD SED  05/03/2021   IR ANGIOGRAM FOLLOW UP STUDY  05/03/2021   IR NEURO EACH ADD'L AFTER BASIC UNI LEFT (MS)  05/03/2021   IR TRANSCATH/EMBOLIZ  05/03/2021   RADIOLOGY WITH ANESTHESIA N/A 05/03/2021   Procedure: IR WITH ANESTHESIA;  Surgeon: Lisbeth Renshaw,  MD;  Location: [facility] OR;  Service: Radiology;  Laterality: N/A;   TONSILLECTOMY AND ADENOIDECTOMY       Social History:   reports that he quit smoking about 54 years ago. His smoking use included cigarettes. He started smoking about 66 years ago. He has a 24.00 pack-year smoking history. He has never used smokeless tobacco. He reports that he does not currently use alcohol. He reports that he does not use drugs.   Family History:  His family history includes Breast cancer (age of onset: 50) in his mother; Heart attack (age of onset: 40) in his sister; Heart disease in his sister; Hypertension in his father; Vision loss in his mother.   Allergies No Known Allergies   Home Medications  Prior to Admission medications   Medication Sig Start Date End Date Taking? Authorizing Provider  acetaminophen (TYLENOL) 325 MG tablet Take 1-2 tablets (325-650 mg total) by mouth every 4 (four) hours as needed for mild pain. 05/01/21   Love, Evlyn Kanner, PA-C  AMBULATORY NON FORMULARY MEDICATION Medication Name: Diltiazem ointment - Using your index finger, apply a small amount of medication inside the rectum up to your first knuckle/joint three times daily x 6-8 weeks. 05/23/21   Unk Lightning, PA  Cholecalciferol (VITAMIN D) 125 MCG (5000 UT) CAPS Take 5,000 Units by mouth in the morning.    [provider]  diltiazem 2 % GEL Place 1 application. rectally 3 (three) times daily as needed (ANAL FISSURE PAIN.). (PATIENT PRESCRIPTION IS FOR OINTMENT) Using your index finger, apply a small amount inside the rectum up to your first knuckle/joint three times daily x6-8 weeks. 05/23/21   [provider]  ezetimibe (ZETIA) 10 MG tablet Take 10 mg by mouth daily.    [provider]  famotidine (PEPCID) 20 MG tablet Take 20 mg by mouth every 8 (eight) hours.    [provider]  fluticasone furoate-vilanterol (BREO ELLIPTA) 100-25 MCG/ACT AEPB Inhale 1 puff into the lungs daily. 07/08/21    Luciano Cutter, MD  ibandronate (BONIVA) 150 MG tablet Take 150 mg by mouth every 30 (thirty) days. 06/03/21   [provider]  linaclotide Karlene Einstein) 145 MCG CAPS capsule Take 1 capsule (145 mcg total) by mouth daily before breakfast. 05/23/21   Unk Lightning, PA  lisinopril (ZESTRIL) 20 MG tablet TAKE 1 TABLET BY MOUTH EVERY DAY IN THE MORNING 08/08/21   Tolia, Sunit, DO  Melatonin 10 MG TABS Take  10 mg by mouth at bedtime.    [provider]  Multiple Vitamin (MULTIVITAMIN WITH MINERALS) TABS tablet Take 1 tablet by mouth in the morning.    [provider]  omeprazole (PRILOSEC) 20 MG capsule Take 1 capsule (20 mg total) by mouth daily. 06/05/21   Tolia, Sunit, DO  Pirfenidone (ESBRIET) 267 MG TABS Take 1 tab three times daily for 7 days, then 2 tabs three times daily for 7 days, then 3 tabs three times daily thereafter. 07/22/21   Luciano Cutter, MD  Pirfenidone (ESBRIET) 267 MG TABS Take 3 tablets (801 mg total) by mouth in the morning, at noon, and at bedtime. 08/21/21   Luciano Cutter, MD  Potassium 99 MG TABS Take 198 mg by mouth in the morning.    [provider]  rivaroxaban (XARELTO) 20 MG TABS tablet Take 1 tablet (20 mg total) by mouth daily with supper. 08/05/21   Tolia, Sunit, DO  rosuvastatin (CRESTOR) 20 MG tablet Take 1 tablet (20 mg total) by mouth at bedtime for 90 doses. 01/30/21 06/22/23  Tolia, Sunit, DO  tamsulosin (FLOMAX) 0.4 MG CAPS capsule Take 2 capsules (0.8 mg total) by mouth daily. Patient taking differently: Take 0.4 mg by mouth in the morning and at bedtime. 04/25/21   Lorin Glass, MD  torsemide (DEMADEX) 5 MG tablet Take 1 tablet (5 mg total) by mouth in the morning. 08/05/21   Tolia, Sunit, DO  zolpidem (AMBIEN) 5 MG tablet Take 1 tablet (5 mg total) by mouth at bedtime as needed for sleep. 08/29/21   Milagros Loll, MD     Critical care time: 43 m       Cyril Mourning MD. Tonny Bollman. [facility] Pager : [phone]  If no response to pager , please call [phone] until 7 pm After 7:00 pm call Elink  [phone]   09/01/2021

## 2021-09-01 NOTE — Procedures (Signed)
Central Venous Catheter Insertion Procedure Note  Calvin Brown  [id number]  09-01-1933  Date:09/01/21  Time:3:43 PM   Provider Performing:Melford Tullier Judie Petit Janyth Contes   Procedure: Insertion of Non-tunneled Central Venous [phone]) with US guidance (82505)   Indication(s) Medication administration  Consent Risks of the procedure as well as the alternatives and risks of each were explained to the patient and/or caregiver.  Consent for the procedure was obtained and is signed in the bedside chart  Anesthesia Topical only with 1% lidocaine   Timeout Verified patient identification, verified procedure, site/side was marked, verified correct patient position, special equipment/implants available, medications/allergies/relevant history reviewed, required imaging and test results available.  Sterile Technique Maximal sterile technique including full sterile barrier drape, hand hygiene, sterile gown, sterile gloves, mask, hair covering, sterile ultrasound probe cover (if used).  Procedure Description Area of catheter insertion was cleaned with chlorhexidine and draped in sterile fashion.  With real-time ultrasound guidance a central venous catheter was placed into the right internal jugular vein. Nonpulsatile blood flow and easy flushing noted in all ports.  The catheter was sutured in place and sterile dressing applied.  Complications/Tolerance None; patient tolerated the procedure well. Chest X-ray is ordered to verify placement for internal jugular or subclavian cannulation.   Chest x-ray is not ordered for femoral cannulation.  EBL Minimal  Specimen(s) None

## 2021-09-01 NOTE — ED Notes (Signed)
CT beginning

## 2021-09-01 NOTE — Progress Notes (Signed)
EEG complete - results pending 

## 2021-09-01 NOTE — ED Notes (Signed)
EDP, EDPA, pharm D, RT at [facility], no IV access, unsuccessful after multiple attempts before and after arrival. [place] attempt in progress

## 2021-09-01 NOTE — ED Notes (Signed)
Family x2 at [facility]. RT at [facility]. Going to CT at this time.

## 2021-09-01 NOTE — ED Provider Notes (Signed)
[facility] DEPART[facility] Provider Note   CSN: [id number] Arrival date & time: 09/01/21  1020     History  No chief complaint on file.   Calvin Brown  is a 88 y.o. male.  88 yo M with a chief complaints of unresponsiveness.  The patient was last seen normal last night.  Has had a couple weeks of worsening confusion and increased lethargy.  Had recently started a new medication for his pulmonary fibrosis.  Also had recently started Ambien a couple days ago.  No obvious infectious symptoms endorsed per family.  No cough congestion or fever no abdominal pain no vomiting no diarrhea.  Had been eating and drinking a little bit less.  Had suffered a fall a week ago.  Was actually seen in the ED recently a few days post fall.  Had negative imaging blood work has looked reassuring and he was discharged home.  This morning the wife woke up and was unable to get him to respond and EMS was called.  Found to be initially hypotensive with EMS.  Some improvement in route without intervention.  Placed on nonrebreather.        Home Medications Prior to Admission medications   Medication Sig Start Date End Date Taking? Authorizing Provider  acetaminophen (TYLENOL) 325 MG tablet Take 1-2 tablets (325-650 mg total) by mouth every 4 (four) hours as needed for mild pain. 05/01/21  Yes Love, Ivan Anchors, PA-C  Cholecalciferol (VITAMIN D) 125 MCG (5000 UT) CAPS Take 5,000 Units by mouth in the morning.   Yes [provider]  ezetimibe (ZETIA) 10 MG tablet Take 10 mg by mouth daily.   Yes [provider]  AMBULATORY NON FORMULARY MEDICATION Medication Name: Diltiazem ointment - Using your index finger, apply a small amount of medication inside the rectum up to your first knuckle/joint three times daily x 6-8 weeks. Patient not taking: Reported on 09/01/2021 05/23/21   Levin Erp, PA  famotidine (PEPCID) 20 MG tablet Take 20 mg by mouth every 8 (eight) hours.     [provider]  fluticasone furoate-vilanterol (BREO ELLIPTA) 100-25 MCG/ACT AEPB Inhale 1 puff into the lungs daily. 07/08/21   Margaretha Seeds, MD  ibandronate (BONIVA) 150 MG tablet Take 150 mg by mouth every 30 (thirty) days. 06/03/21   [provider]  linaclotide Rolan Lipa) 145 MCG CAPS capsule Take 1 capsule (145 mcg total) by mouth daily before breakfast. 05/23/21   Levin Erp, PA  lisinopril (ZESTRIL) 20 MG tablet TAKE 1 TABLET BY MOUTH EVERY DAY IN THE MORNING 08/08/21   Tolia, Sunit, DO  Melatonin 10 MG TABS Take 10 mg by mouth at bedtime.    [provider]  Multiple Vitamin (MULTIVITAMIN WITH MINERALS) TABS tablet Take 1 tablet by mouth in the morning.    [provider]  omeprazole (PRILOSEC) 20 MG capsule Take 1 capsule (20 mg total) by mouth daily. 06/05/21   Tolia, Sunit, DO  Pirfenidone (ESBRIET) 267 MG TABS Take 1 tab three times daily for 7 days, then 2 tabs three times daily for 7 days, then 3 tabs three times daily thereafter. 07/22/21   Margaretha Seeds, MD  Pirfenidone (ESBRIET) 267 MG TABS Take 3 tablets (801 mg total) by mouth in the morning, at noon, and at bedtime. 08/21/21   Margaretha Seeds, MD  Potassium 99 MG TABS Take 198 mg by mouth in the morning.    [provider]  rivaroxaban Alveda Reasons) 20  MG TABS tablet Take 1 tablet (20 mg total) by mouth daily with supper. 08/05/21   Tolia, Sunit, DO  rosuvastatin (CRESTOR) 20 MG tablet Take 1 tablet (20 mg total) by mouth at bedtime for 90 doses. 01/30/21 06/22/23  Tolia, Sunit, DO  tamsulosin (FLOMAX) 0.4 MG CAPS capsule Take 2 capsules (0.8 mg total) by mouth daily. Patient taking differently: Take 0.4 mg by mouth in the morning and at bedtime. 04/25/21   Lorin Glass, MD  torsemide (DEMADEX) 5 MG tablet Take 1 tablet (5 mg total) by mouth in the morning. 08/05/21   Tolia, Sunit, DO  zolpidem (AMBIEN) 5 MG tablet Take 1 tablet (5 mg total) by mouth at bedtime as needed for  sleep. 08/29/21   Milagros Loll, MD      Allergies    Patient has no known allergies.    Review of Systems   Review of Systems  Physical Exam Updated Vital Signs BP 93/66   Pulse (!) 108   Temp (!) 95.7 F (35.4 C)   Resp 15   SpO2 94%  Physical Exam Vitals and nursing note reviewed.  Constitutional:      Appearance: He is well-developed.     Comments: Eyes are open  HENT:     Head: Normocephalic and atraumatic.  Eyes:     Pupils: Pupils are equal, round, and reactive to light.  Neck:     Vascular: No JVD.     Comments: JVD to the angle of the jaw. Cardiovascular:     Rate and Rhythm: Normal rate and regular rhythm.     Heart sounds: No murmur heard.    No friction rub. No gallop.  Pulmonary:     Effort: No respiratory distress.     Breath sounds: No wheezing.  Abdominal:     General: There is no distension.     Tenderness: There is no abdominal tenderness. There is no guarding or rebound.  Musculoskeletal:        General: Normal range of motion.     Cervical back: Normal range of motion and neck supple.  Skin:    Coloration: Skin is not pale.     Findings: No rash.  Neurological:     Comments: Patient with no measurable response.  Spontaneously has some garbled noises.  No obvious localization to pain.  Pupils are 2 mm and minimally reactive.  Had some decorticate like movements with the left upper extremity transiently with IV start.  Psychiatric:        Behavior: Behavior normal.     ED Results / Procedures / Treatments   Labs (all labs ordered are listed, but only abnormal results are displayed) Labs Reviewed  COMPREHENSIVE METABOLIC PANEL - Abnormal; Notable for the following components:      Result Value   Potassium 5.6 (*)    Glucose, Bld 132 (*)    BUN 40 (*)    Creatinine, Ser 1.81 (*)    Calcium 8.6 (*)    Total Protein 6.1 (*)    Albumin 3.2 (*)    AST 795 (*)    ALT 928 (*)    GFR, Estimated 36 (*)    All other components within  normal limits  CBC - Abnormal; Notable for the following components:   Hemoglobin 12.8 (*)    MCHC 29.7 (*)    All other components within normal limits  PROTIME-INR - Abnormal; Notable for the following components:   Prothrombin Time 20.9 (*)  INR 1.8 (*)    All other components within normal limits  LACTIC ACID, PLASMA - Abnormal; Notable for the following components:   Lactic Acid, Venous 2.0 (*)    All other components within normal limits  URINALYSIS, ROUTINE W REFLEX MICROSCOPIC - Abnormal; Notable for the following components:   APPearance TURBID (*)    Hgb urine dipstick MODERATE (*)    Protein, ur 100 (*)    Leukocytes,Ua LARGE (*)    All other components within normal limits  I-STAT VENOUS BLOOD GAS, ED - Abnormal; Notable for the following components:   pCO2, Ven 82.2 (*)    pO2, Ven 130 (*)    Bicarbonate 36.7 (*)    TCO2 39 (*)    Acid-Base Excess 7.0 (*)    Potassium 5.5 (*)    Calcium, Ion 1.07 (*)    All other components within normal limits  I-STAT CHEM 8, ED - Abnormal; Notable for the following components:   Potassium 5.5 (*)    BUN 53 (*)    Creatinine, Ser 1.90 (*)    Glucose, Bld 130 (*)    Calcium, Ion 1.05 (*)    TCO2 35 (*)    All other components within normal limits  I-STAT ARTERIAL BLOOD GAS, ED - Abnormal; Notable for the following components:   pH, Arterial 7.528 (*)    pCO2 arterial 30.4 (*)    Acid-Base Excess 3.0 (*)    Calcium, Ion 1.02 (*)    HCT 30.0 (*)    Hemoglobin 10.2 (*)    All other components within normal limits  TROPONIN I (HIGH SENSITIVITY) - Abnormal; Notable for the following components:   Troponin I (High Sensitivity) 508 (*)    All other components within normal limits  URINE CULTURE  CULTURE, BLOOD (ROUTINE X 2)  CULTURE, BLOOD (ROUTINE X 2)  ETHANOL  LACTIC ACID, PLASMA  AMMONIA  CORTISOL  URINALYSIS, MICROSCOPIC (REFLEX)  BLOOD GAS, ARTERIAL  TRIGLYCERIDES  PROCALCITONIN  LIPASE, BLOOD  SODIUM, URINE,  RANDOM  CREATININE, URINE, RANDOM  SAMPLE TO BLOOD BANK  TROPONIN I (HIGH SENSITIVITY)  TROPONIN I (HIGH SENSITIVITY)    EKG EKG Interpretation  Date/Time:  Sunday September 01 2021 10:47:01 EDT Ventricular Rate:  76 PR Interval:    QRS Duration: 117 QT Interval:  389 QTC Calculation: 438 R Axis:   101 Text Interpretation: Atrial fibrillation Nonspecific intraventricular conduction delay Nonspecific T abnormalities, inferior leads No significant change since last tracing Confirmed by Melene Plan [phone]) on 09/01/2021 11:57:09 AM  Radiology CT ABDOMEN PELVIS WO CONTRAST  Result Date: 09/01/2021 CLINICAL DATA:  Hepatitis, unwitnessed fall EXAM: CT ABDOMEN AND PELVIS WITHOUT CONTRAST TECHNIQUE: Multidetector CT imaging of the abdomen and pelvis was performed following the standard protocol without IV contrast. RADIATION DOSE REDUCTION: This exam was performed according to the departmental dose-optimization program which includes automated exposure control, adjustment of the mA and/or kV according to patient size and/or use of iterative reconstruction technique. COMPARISON:  06/05/2021 FINDINGS: Lower chest: Small bilateral pleural effusions and associated atelectasis or consolidation. Cardiomegaly. Three-vessel coronary artery calcifications Hepatobiliary: No solid liver abnormality is seen. No gallstones, gallbladder wall thickening, or biliary dilatation. Pancreas: Diffuse peripancreatic fat stranding and fluid. No evident pancreatic ductal dilatation or acute pancreatic fluid collection. Spleen: Normal in size without significant abnormality. Adrenals/Urinary Tract: Adrenal glands are unremarkable. Small nonobstructive left renal calculi. Mildly patulous appearance of the left renal pelvis, unchanged compared to prior. Simple, benign right renal cyst, for which no  further follow-up or characterization is required. Bilateral perinephric fat stranding, increased compared to prior examination. Foley  catheter in the bladder. Stomach/Bowel: Esophagogastric tube with tip and side port below the diaphragm, tip in the gastric fundus. Stomach is within normal limits. Appendix is not clearly visualized. No evidence of bowel wall thickening, distention, or inflammatory changes. Vascular/Lymphatic: Aortic atherosclerosis. No enlarged abdominal or pelvic lymph nodes. Reproductive: No mass or other significant abnormality. Other: No abdominal wall hernia or abnormality. Small volume perihepatic and perisplenic ascites. Musculoskeletal: No acute or significant osseous findings. IMPRESSION: 1. Diffuse peripancreatic fat stranding and fluid, consistent with acute pancreatitis. No evident pancreatic ductal dilatation or acute pancreatic fluid collection on noncontrast examination. 2. Small volume ascites. 3. Small bilateral pleural effusions and associated atelectasis or consolidation. 4. Small nonobstructive left renal calculi. Mildly patulous appearance of the left renal pelvis, unchanged compared to prior examination. No ureteral calculi or overt hydronephrosis. 5. Bilateral perinephric fat stranding, increased compared to prior examination possibly reactive to adjacent pancreatitis. Correlate with urinalysis to exclude urinary tract infection. 6. Coronary artery disease. Aortic Atherosclerosis (ICD10-I70.0). Electronically Signed   By: Delanna Ahmadi M.D.   On: 09/01/2021 13:16   CT Head Wo Contrast  Result Date: 09/01/2021 CLINICAL DATA:  Head trauma.  Unwitnessed fall.  On Xarelto. EXAM: CT HEAD WITHOUT CONTRAST TECHNIQUE: Contiguous axial images were obtained from the base of the skull through the vertex without intravenous contrast. RADIATION DOSE REDUCTION: This exam was performed according to the departmental dose-optimization program which includes automated exposure control, adjustment of the mA and/or kV according to patient size and/or use of iterative reconstruction technique. COMPARISON:  Head CT 08/29/2021  FINDINGS: Brain: There is no evidence of an acute infarct, acute intracranial hemorrhage, mass, or midline shift. At most trace residual low-density subdural fluid remains over the left frontal convexity. There is a moderate-sized chronic right MCA infarct involving the anterior insula and frontal operculum with ex vacuo dilatation of the frontal horn of the right lateral ventricle. Hypodensities elsewhere in the cerebral white matter bilaterally are unchanged and nonspecific but compatible with mild chronic small vessel ischemic disease. Vascular: Calcified atherosclerosis at the skull base. Prior left middle meningeal artery embolization. Skull: No fracture or suspicious osseous lesion. Sinuses/Orbits: Mild mucosal thickening in the ethmoid sinuses. Clear mastoid air cells. Bilateral cataract extraction. Other: None. IMPRESSION: 1. No evidence of acute intracranial abnormality. 2. Chronic findings as above. Electronically Signed   By: Logan Bores M.D.   On: 09/01/2021 13:14   DG Chest Portable 1 View  Result Date: 09/01/2021 CLINICAL DATA:  Status post intubation. EXAM: PORTABLE CHEST 1 VIEW COMPARISON:  08/29/2021 FINDINGS: 1133 hours. Endotracheal tube tip is 5.6 cm above the base of the carina. NG tube is looped in the stomach with tip overlying the gastric cardia. No edema or pneumothorax. No substantial right pleural effusion. There is some basilar atelectasis bilaterally with possible tiny left effusion. Telemetry leads overlie the chest. IMPRESSION: 1. Endotracheal tube tip 5.6 cm above the carina. 2. NG tube tip is in the stomach. Electronically Signed   By: Misty Stanley M.D.   On: 09/01/2021 12:02    Procedures Procedure Name: Intubation Date/Time: 09/01/2021 12:12 PM  Performed by: Deno Etienne, DOPre-anesthesia Checklist: Patient identified, Patient being monitored, Emergency Drugs available, Timeout performed and Suction available Oxygen Delivery Method: Non-rebreather mask Preoxygenation:  Pre-oxygenation with 100% oxygen Induction Type: Rapid sequence Ventilation: Mask ventilation without difficulty Laryngoscope Size: Glidescope Grade View: Grade I Tube size: 7.5  mm Number of attempts: 1 Airway Equipment and Method: Video-laryngoscopy Placement Confirmation: ETT inserted through vocal cords under direct vision, CO2 detector and Breath sounds checked- equal and bilateral Secured at: 24 cm Tube secured with: ETT holder Dental Injury: Teeth and Oropharynx as per pre-operative assessment  Difficulty Due To: Difficulty was anticipated Future Recommendations: Recommend- induction with short-acting agent, and alternative techniques readily available       Procedure note: Ultrasound Guided Peripheral IV Ultrasound guided peripheral 1.88 inch angiocath IV placement performed by me. Indications: Nursing unable to place IV. Details: The antecubital fossa and upper arm were evaluated with a multifrequency linear probe. Patent brachial veins were noted. 1 attempt was made to cannulate a vein under realtime US guidance with successful cannulation of the vein and catheter placement. There is return of non-pulsatile dark red blood. The patient tolerated the procedure well without complications. Images archived electronically.  CPT codes: 85027 and [phone] Procedure note: Ultrasound Guided Peripheral IV Ultrasound guided peripheral 1.88 inch angiocath IV placement performed by me. Indications: Nursing unable to place IV. Details: The antecubital fossa and upper arm were evaluated with a multifrequency linear probe. Patent brachial veins were noted. 1 attempt was made to cannulate a vein under realtime US guidance with successful cannulation of the vein and catheter placement. There is return of non-pulsatile dark red blood. The patient tolerated the procedure well without complications. Images archived electronically.  CPT codes: 78676 and [phone]    EMERGENCY DEPARTMENT [place] CARDIAC  EXAM "Study: Limited Ultrasound of the Heart and Pericardium"  INDICATIONS:Abnormal vital signs Multiple views of the heart and pericardium were obtained in real-time with a multi-frequency probe.  PERFORMED BS:[id number] IMAGES ARCHIVED?: Yes LIMITATIONS:  Body habitus and Emergent procedure VIEWS USED: Parasternal short axis and Inferior Vena Cava INTERPRETATION: Cardiac activity present, Cardiac tamponade absent, Decreased contractility, and IVC dilated     Medications Ordered in ED Medications  0.9 %  sodium chloride infusion (0 mLs Intravenous Stopped 09/01/21 1230)  fentaNYL in NS (18mcg/ml) infusion-PREMIX (100 mcg/hr Intravenous New Bag/Given 09/01/21 1115)  fentaNYL (SUBLIMAZE) bolus via infusion 25-100 mcg (100 mcg Intravenous Bolus from Bag 09/01/21 1119)  midazolam (VERSED) injection 1 mg (has no administration in time range)  midazolam (VERSED) injection 1 mg (1 mg Intravenous Given 09/01/21 1411)  polyethylene glycol (MIRALAX / GLYCOLAX) packet 17 g (has no administration in time range)  heparin injection 5,000 Units (has no administration in time range)  pantoprazole sodium (PROTONIX) 40 mg/20 mL oral suspension 40 mg (has no administration in time range)  ipratropium-albuterol (DUONEB) 0.5-2.5 (3) MG/3ML nebulizer solution 3 mL (3 mLs Nebulization Given 09/01/21 1411)  arformoterol (BROVANA) nebulizer solution 15 mcg (has no administration in time range)  docusate (COLACE) 50 MG/5ML liquid 100 mg (has no administration in time range)  0.9 %  sodium chloride infusion (has no administration in time range)  norepinephrine (LEVOPHED) 4mg  in (0.016 mg/mL) premix infusion (has no administration in time range)  phenylephrine (NEO-SYNEPHRINE) injection (160 mcg Intravenous Given 09/01/21 1053)  etomidate (AMIDATE) injection (20 mg Intravenous Given 09/01/21 1054)  rocuronium (ZEMURON) injection (100 mg Intravenous Given 09/01/21 1055)  phenylephrine (NEO-SYNEPHRINE) injection  (160 mcg Intravenous Given 09/01/21 1059)  norepinephrine (LEVOPHED) 4mg  in 11/01/21 (0.016 mg/mL) premix infusion (15 mcg/min Intravenous Rate/Dose Change 09/01/21 1428)  vancomycin (VANCOREADY) IVPB 1500 mg/300 mL (1,500 mg Intravenous New Bag/Given 09/01/21 1305)  piperacillin-tazobactam (ZOSYN) IVPB 3.375 g (0 g Intravenous Stopped 09/01/21 1248)  sodium chloride 0.9 % bolus  1,000 mL (0 mLs Intravenous Stopped 09/01/21 1152)  sodium chloride 0.9 % bolus 1,000 mL (0 mLs Intravenous Stopped 09/01/21 1230)    ED Course/ Medical Decision Making/ A&P                           Medical Decision Making Amount and/or Complexity of Data Reviewed Labs: ordered. Radiology: ordered.  Risk Prescription drug management. Decision regarding hospitalization.   88 yo M with a chief complaint of altered mental status.  Patient arrived with a severely diminished GCS.  Patient was intubated for airway protection.  Hypotensive on arrival was given IV fluids without significant improvement and started on Levophed.  Neo-Synephrine given during intubation attempt.  Patient is hypothermic.  Given broad-spectrum antibiotics.  Cortisol and thyroid studies sent.  LFT elevation of unknown significance.  No obvious abdominal discomfort.  No obvious history of abdominal discomfort.  Will obtain a CT scan of the head and abdomen pelvis.  CT concerning for acute pancreatitis.  Discussed with critical care for admission.  CRITICAL CARE Performed by: Cecilio Asper   Total critical care time: 80 minutes  Critical care time was exclusive of separately billable procedures and treating other patients.  Critical care was necessary to treat or prevent imminent or life-threatening deterioration.  Critical care was time spent personally by me on the following activities: development of treatment plan with patient and/or surrogate as well as nursing, discussions with consultants, evaluation of patient's response to treatment,  examination of patient, obtaining history from patient or surrogate, ordering and performing treatments and interventions, ordering and review of laboratory studies, ordering and review of radiographic studies, pulse oximetry and re-evaluation of patient's condition.  The patients results and plan were reviewed and discussed.   Any x-rays performed were independently reviewed by myself.   Differential diagnosis were considered with the presenting HPI.  Medications  0.9 %  sodium chloride infusion (0 mLs Intravenous Stopped 09/01/21 1230)  fentaNYL 2558mcg in NS 219mL (32mcg/ml) infusion-PREMIX (100 mcg/hr Intravenous New Bag/Given 09/01/21 1115)  fentaNYL (SUBLIMAZE) bolus via infusion 25-100 mcg (100 mcg Intravenous Bolus from Bag 09/01/21 1119)  midazolam (VERSED) injection 1 mg (has no administration in time range)  midazolam (VERSED) injection 1 mg (1 mg Intravenous Given 09/01/21 1411)  polyethylene glycol (MIRALAX / GLYCOLAX) packet 17 g (has no administration in time range)  heparin injection 5,000 Units (has no administration in time range)  pantoprazole sodium (PROTONIX) 40 mg/20 mL oral suspension 40 mg (has no administration in time range)  ipratropium-albuterol (DUONEB) 0.5-2.5 (3) MG/3ML nebulizer solution 3 mL (3 mLs Nebulization Given 09/01/21 1411)  arformoterol (BROVANA) nebulizer solution 15 mcg (has no administration in time range)  docusate (COLACE) 50 MG/5ML liquid 100 mg (has no administration in time range)  0.9 %  sodium chloride infusion (has no administration in time range)  norepinephrine (LEVOPHED) 4mg  in 221mL (0.016 mg/mL) premix infusion (has no administration in time range)  phenylephrine (NEO-SYNEPHRINE) injection (160 mcg Intravenous Given 09/01/21 1053)  etomidate (AMIDATE) injection (20 mg Intravenous Given 09/01/21 1054)  rocuronium (ZEMURON) injection (100 mg Intravenous Given 09/01/21 1055)  phenylephrine (NEO-SYNEPHRINE) injection (160 mcg Intravenous Given 09/01/21  1059)  norepinephrine (LEVOPHED) 4mg  in 269mL (0.016 mg/mL) premix infusion (15 mcg/min Intravenous Rate/Dose Change 09/01/21 1428)  vancomycin (VANCOREADY) IVPB 1500 mg/300 mL (1,500 mg Intravenous New Bag/Given 09/01/21 1305)  piperacillin-tazobactam (ZOSYN) IVPB 3.375 g (0 g Intravenous Stopped 09/01/21 1248)  sodium chloride 0.9 %  bolus 1,000 mL (0 mLs Intravenous Stopped 09/01/21 1152)  sodium chloride 0.9 % bolus 1,000 mL (0 mLs Intravenous Stopped 09/01/21 1230)    Vitals:   09/01/21 1345 09/01/21 1400 09/01/21 1415 09/01/21 1430  BP: (!) 100/51 (!) 94/57 (!) 87/40 93/66  Pulse: 98 98 98 (!) 108  Resp: 15 15 15 15   Temp: (!) 95 F (35 C) (!) 95.2 F (35.1 C) (!) 95.5 F (35.3 C) (!) 95.7 F (35.4 C)  SpO2: 96% 96% 95% 94%    Final diagnoses:  Acute pancreatitis, unspecified complication status, unspecified pancreatitis type    Admission/ observation were discussed with the admitting physician, patient and/or family and they are comfortable with the plan.         Final Clinical Impression(s) / ED Diagnoses Final diagnoses:  Acute pancreatitis, unspecified complication status, unspecified pancreatitis type    Rx / DC Orders ED Discharge Orders     None         Deno Etienne, DO 09/01/21 1507

## 2021-09-01 NOTE — Progress Notes (Signed)
ANTICOAGULATION CONSULT NOTE - Initial Consult  Pharmacy Consult for heparin Indication: chest pain/ACS  No Known Allergies  Patient Measurements:   Ht 5'11" Wt 75.9kg Heparin Dosing Weight: 76 kg  Vital Signs: Temp: 95.7 F (35.4 C) (08/06 1430) Temp Source: Bladder (08/06 1908) BP: 93/66 (08/06 1430) Pulse Rate: 110 (08/06 1520)  Labs: Recent Labs    09/01/21 1030 09/01/21 1041 09/01/21 1212 09/01/21 1230 09/01/21 1722  HGB 12.8* 13.6  13.9  --  10.2*  --   HCT 43.1 40.0  41.0  --  30.0*  --   PLT 190  --   --   --   --   LABPROT 20.9*  --   --   --   --   INR 1.8*  --   --   --   --   CREATININE 1.81* 1.90*  --   --   --   TROPONINIHS  --   --  508*  --  2,111*    Estimated Creatinine Clearance: 29.7 mL/min (A) (by C-G formula based on SCr of 1.9 mg/dL (H)).   Medical History: Past Medical History:  Diagnosis Date   Atrial fibrillation (HCC)    CAD (coronary artery disease)    CVA (cerebral vascular accident) (HCC) 05/27/2013   GI bleed 05/27/2013   History of blood transfusion    GI Bleed   History of kidney stones    surgery to remove   HTN (hypertension)    Hx of CABG    Possibly 2015?   Hyperlipidemia    Pneumonia      Assessment: 88 yo M admitted for septic shock, acute encephalopathy and acute pancreatitis with rising troponins up to 2111. PMH significant for chronic SDH, chronic CVA. Patient is on rivaroxaban PTA, LD 8/5 @ 1930. INR elevated due to rivaroxaban. Pharmacy consulted for heparin.   Noted AKI, which will prolong rivaroxaban clearance. Follow aPTT levels until correlation with heparin level.  Goal of Therapy:  Heparin level 0.3-0.7 units/ml Monitor platelets by anticoagulation protocol: Yes   Plan:  Heparin 1150 units/hr, no bolus F/u aPTT until correlates with heparin level  Monitor daily aPTT, heparin level, CBC Monitor for signs/symptoms of bleeding    Alphia Moh, PharmD, BCPS, BCCP Clinical Pharmacist  Please  check AMION for all [facility] Pharmacy phone numbers After 10:00 PM, call Main Pharmacy [phone]

## 2021-09-01 NOTE — Progress Notes (Signed)
eLink Physician-Brief Progress Note Patient Name: Calvin Brown DOB: March 16, 1933 MRN: [id number]   Date of Service  09/01/2021  HPI/Events of Note  FYI only,   bump in Trop's, ECG was done, pharmacy starting Hepar    was on xarelton PTA    BSRN told me that NP on day shift was aware of Trops     H/O AFib  eICU Interventions  - notes, labs meds reviewed. EKG seen and compared to earlier one showing anterior new T down. Elevated troponin ( up to 2111. Delta quadruppled.)  Pro cal 0.21, on abx for sepsis already.  Stable a fib. Prolonged qtc  - continue current care.  Discussed with bed side RN. On heparin gtt now. Trend labs.       Intervention Category Intermediate Interventions: Other: (EKG, labs review)  Ranee Gosselin 09/01/2021, 8:40 PM

## 2021-09-01 NOTE — ED Notes (Signed)
BIB [facility] for AMS from home, wife called, fell 4 days ago, L elbow skin tear. Responsive to pain only. Airway patent.

## 2021-09-01 NOTE — ED Triage Notes (Signed)
Multiple PIV attempts by RN x 2 unsuccessful

## 2021-09-01 NOTE — ED Notes (Addendum)
Family back at [facility], EDP into see, update.

## 2021-09-02 ENCOUNTER — Inpatient Hospital Stay (HOSPITAL_COMMUNITY): Payer: Medicare Other

## 2021-09-02 DIAGNOSIS — I214 Non-ST elevation (NSTEMI) myocardial infarction: Secondary | ICD-10-CM | POA: Diagnosis not present

## 2021-09-02 DIAGNOSIS — I248 Other forms of acute ischemic heart disease: Secondary | ICD-10-CM | POA: Diagnosis not present

## 2021-09-02 DIAGNOSIS — E440 Moderate protein-calorie malnutrition: Secondary | ICD-10-CM | POA: Insufficient documentation

## 2021-09-02 DIAGNOSIS — J9602 Acute respiratory failure with hypercapnia: Secondary | ICD-10-CM | POA: Diagnosis not present

## 2021-09-02 DIAGNOSIS — J9601 Acute respiratory failure with hypoxia: Secondary | ICD-10-CM | POA: Diagnosis not present

## 2021-09-02 DIAGNOSIS — G9340 Encephalopathy, unspecified: Secondary | ICD-10-CM | POA: Diagnosis not present

## 2021-09-02 LAB — APTT
aPTT: 113 seconds — ABNORMAL HIGH (ref 24–36)
aPTT: 131 seconds — ABNORMAL HIGH (ref 24–36)
aPTT: 87 seconds — ABNORMAL HIGH (ref 24–36)
aPTT: 80 seconds — ABNORMAL HIGH (ref 24–36)

## 2021-09-02 LAB — TROPONIN I (HIGH SENSITIVITY): Troponin I (High Sensitivity): 2161 ng/L (ref <18)

## 2021-09-02 LAB — CBC
HCT: 35.3 % — ABNORMAL LOW (ref 39.0–52.0)
Hemoglobin: 11.4 g/dL — ABNORMAL LOW (ref 13.0–17.0)
MCH: 27.9 pg (ref 26.0–34.0)
MCHC: 32.3 g/dL (ref 30.0–36.0)
MCV: 86.3 fL (ref 80.0–100.0)
Platelets: 167 10*3/uL (ref 150–400)
RBC: 4.09 MIL/uL — ABNORMAL LOW (ref 4.22–5.81)
RDW: 14 % (ref 11.5–15.5)
WBC: 11.6 10*3/uL — ABNORMAL HIGH (ref 4.0–10.5)
nRBC: 0 % (ref 0.0–0.2)

## 2021-09-02 LAB — BASIC METABOLIC PANEL
Anion gap: 6 (ref 5–15)
BUN: 33 mg/dL — ABNORMAL HIGH (ref 8–23)
CO2: 23 mmol/L (ref 22–32)
Calcium: 7.6 mg/dL — ABNORMAL LOW (ref 8.9–10.3)
Chloride: 111 mmol/L (ref 98–111)
Creatinine, Ser: 1.25 mg/dL — ABNORMAL HIGH (ref 0.61–1.24)
GFR, Estimated: 56 mL/min — ABNORMAL LOW (ref >60)
Glucose, Bld: 107 mg/dL — ABNORMAL HIGH (ref 70–99)
Potassium: 3.9 mmol/L (ref 3.5–5.1)
Sodium: 140 mmol/L (ref 135–145)

## 2021-09-02 LAB — GLUCOSE, CAPILLARY
Glucose-Capillary: 91 mg/dL (ref 70–99)
Glucose-Capillary: 97 mg/dL (ref 70–99)
Glucose-Capillary: 99 mg/dL (ref 70–99)
Glucose-Capillary: 84 mg/dL (ref 70–99)
Glucose-Capillary: 91 mg/dL (ref 70–99)
Glucose-Capillary: 94 mg/dL (ref 70–99)

## 2021-09-02 LAB — HEPARIN LEVEL (UNFRACTIONATED)
Heparin Unfractionated: >1.1 IU/mL — ABNORMAL HIGH (ref 0.30–0.70)
Heparin Unfractionated: >1.1 IU/mL — ABNORMAL HIGH (ref 0.30–0.70)

## 2021-09-02 LAB — ECHOCARDIOGRAM COMPLETE
Area-P 1/2: 5.2 cm2
S' Lateral: 3.7 cm
Weight: 2903.02 oz

## 2021-09-02 LAB — PHOSPHORUS: Phosphorus: 2.9 mg/dL (ref 2.5–4.6)

## 2021-09-02 LAB — PROCALCITONIN: Procalcitonin: 0.42 ng/mL

## 2021-09-02 LAB — MAGNESIUM: Magnesium: 2.3 mg/dL (ref 1.7–2.4)

## 2021-09-02 LAB — POCT ACTIVATED CLOTTING TIME: Activated Clotting Time: 0 seconds

## 2021-09-02 MED ORDER — PROSOURCE TF PO LIQD
45.0000 mL | Freq: Two times a day (BID) | ORAL | Status: DC
  Administered 2021-09-02: 45 mL
  Filled 2021-09-02: qty 45

## 2021-09-02 MED ORDER — IOHEXOL 350 MG/ML SOLN
75.0000 mL | Freq: Once | INTRAVENOUS | Status: AC | PRN
  Administered 2021-09-02: 75 mL via INTRAVENOUS

## 2021-09-02 MED ORDER — SODIUM CHLORIDE 0.9% FLUSH: 10.0000 mL | INTRAVENOUS | Status: DC | PRN

## 2021-09-02 MED ORDER — PERFLUTREN LIPID MICROSPHERE
1.0000 mL | INTRAVENOUS | Status: AC | PRN
  Administered 2021-09-02: 2 mL via INTRAVENOUS

## 2021-09-02 MED ORDER — DEXMEDETOMIDINE HCL IN NACL 400 MCG/100ML IV SOLN
0.1000 ug/kg/h | INTRAVENOUS | Status: DC
  Administered 2021-09-02 – 2021-09-03 (×3): 0.7 ug/kg/h via INTRAVENOUS
  Filled 2021-09-02 (×2): qty 100

## 2021-09-02 MED ORDER — PROSOURCE TF PO LIQD
45.0000 mL | Freq: Four times a day (QID) | ORAL | Status: DC
  Administered 2021-09-02 (×2): 45 mL
  Filled 2021-09-02 (×2): qty 45

## 2021-09-02 MED ORDER — FENTANYL CITRATE PF 50 MCG/ML IJ SOSY
25.0000 ug | PREFILLED_SYRINGE | INTRAMUSCULAR | Status: DC | PRN
  Administered 2021-09-02 – 2021-09-03 (×2): 50 ug via INTRAVENOUS
  Filled 2021-09-02: qty 2
  Filled 2021-09-02: qty 1

## 2021-09-02 MED ORDER — SODIUM CHLORIDE 0.9% FLUSH
10.0000 mL | Freq: Two times a day (BID) | INTRAVENOUS | Status: DC
  Administered 2021-09-02 – 2021-09-05 (×8): 10 mL

## 2021-09-02 MED ORDER — VITAL 1.5 CAL PO LIQD
1000.0000 mL | ORAL | Status: DC
  Administered 2021-09-02: 1000 mL

## 2021-09-02 MED ORDER — HEPARIN (PORCINE) 25000 UT/250ML-% IV SOLN
1000.0000 [IU]/h | INTRAVENOUS | Status: AC
Start: 2021-09-02 — End: 2021-09-03
  Administered 2021-09-02: 950 [IU]/h via INTRAVENOUS
  Filled 2021-09-02: qty 250

## 2021-09-02 MED ORDER — VITAL HIGH PROTEIN PO LIQD: 1000.0000 mL | ORAL | Status: DC

## 2021-09-02 NOTE — Progress Notes (Signed)
RT NOTE: RT transported patient on ventilator from room 2M08 to CT and back to room 2M08 with no apparent complications. Vitals are stable. RT will continue to monitor.

## 2021-09-02 NOTE — Progress Notes (Signed)
Initial Nutrition Assessment  DOCUMENTATION CODES:   Non-severe (moderate) malnutrition in context of chronic illness  INTERVENTION:   Initiate tube feeding via OG tube: Vital 1.5 at 25 ml/h, increase by 10 ml every 8 hours to goal rate of 55 ml/h (1320 ml per day) Prosource TF 45 ml QID  Provides 2140 kcal, 133 gm protein, 1008 ml free water daily  Monitor magnesium, potassium, and phosphorus BID for at least 3 days, MD to replete as needed, as pt is at risk for refeeding syndrome given malnutrition.  NUTRITION DIAGNOSIS:   Moderate Malnutrition related to chronic illness (CAD) as evidenced by mild fat depletion, moderate fat depletion, mild muscle depletion, moderate muscle depletion.  GOAL:   Patient will meet greater than or equal to 90% of their needs  MONITOR:   Vent status, TF tolerance, Labs  REASON FOR ASSESSMENT:   Ventilator, Consult Enteral/tube feeding initiation and management  ASSESSMENT:   88 yo male admitted with septic shock, non-STEMI. PMH includes HTN,CAD, CVA, HLD, A fib.  Discussed patient in ICU rounds and with RN today.  OG tube in place. Received MD Consult for TF initiation and management. Patient with mild pancreatitis on admission. Lipase normal.   Patient is currently intubated on ventilator support MV: 9.5 L/min Temp (24hrs), Avg:98.5 F (36.9 C), Min:95.5 F (35.3 C), Max:100.4 F (38 C)   Labs reviewed.  CBG: 91-97  Medications reviewed and include Precedex, Levophed, fentanyl.  Weight history reviewed. Usual weight 85.1 kg 05/03/21. Current weight 84.4 kg. Weight has fluctuated between 75 and 85 kg for the past 4 months.   Patient meets criteria for moderate malnutrition with mild-moderate depletion of muscle and subcutaneous fat mass.   NUTRITION - FOCUSED PHYSICAL EXAM:  Flowsheet Row Most Recent Value  Orbital Region Moderate depletion  Upper Arm Region Mild depletion  Thoracic and Lumbar Region Moderate depletion   Buccal Region Unable to assess  Temple Region Moderate depletion  Clavicle Bone Region Moderate depletion  Clavicle and Acromion Bone Region Moderate depletion  Scapular Bone Region Moderate depletion  Dorsal Hand No depletion  Patellar Region Mild depletion  Anterior Thigh Region Mild depletion  Posterior Calf Region Moderate depletion  Edema (RD Assessment) Severe  Hair Reviewed  Eyes Unable to assess  Mouth Unable to assess  Skin Reviewed  Nails Reviewed       Diet Order:   Diet Order             Diet NPO time specified  Diet effective now                   EDUCATION NEEDS:   Not appropriate for education at this time  Skin:  Skin Assessment: Reviewed RN Assessment  Last BM:  no BM documented  Height:   Ht Readings from Last 1 Encounters:  08/29/21 5\' 11"  (1.803 m)    Weight:   Wt Readings from Last 1 Encounters:  09/02/21 84.4 kg     BMI:  Body mass index is 25.95 kg/m.  Estimated Nutritional Needs:   Kcal:  2000-2200  Protein:  120-135 gm  Fluid:  1.9-2 L   11/02/21 RD, LDN, CNSC Please refer to Amion for contact information.

## 2021-09-02 NOTE — Progress Notes (Signed)
eLink Physician-Brief Progress Note Patient Name: [name] DOB: [birth year] MRN: [id number]   Date of Service  09/02/2021  HPI/Events of Note  Restraint renewal requested  eICU Interventions  24 hr order placed to prevent dislodgement of devices     Intervention Category Major Interventions: Respiratory failure - evaluation and management  Oretha Milch 09/02/2021, 9:56 PM

## 2021-09-02 NOTE — Progress Notes (Addendum)
ANTICOAGULATION CONSULT NOTE  Pharmacy Consult for heparin Indication: chest pain/ACS  No Known Allergies  Patient Measurements: Weight: 82.3 kg (181 lb 7 oz) Ht 5'11" Wt 75.9kg Heparin Dosing Weight: 76 kg  Vital Signs: Temp: 99 F (37.2 C) (08/07 0400) Temp Source: Bladder (08/07 0306) BP: 77/31 (08/06 2000) Pulse Rate: 82 (08/07 0400)  Labs: Recent Labs    09/01/21 1030 09/01/21 1041 09/01/21 1212 09/01/21 1230 09/01/21 1722 09/01/21 2047 09/01/21 2200 09/01/21 2300 09/02/21 0412  HGB 12.8* 13.6  13.9  --  10.2*  --   --   --  11.9*  --   HCT 43.1 40.0  41.0  --  30.0*  --   --   --  35.0*  --   PLT 190  --   --   --   --   --   --   --   --   APTT  --   --   --   --   --   --   --   --  113*  LABPROT 20.9*  --   --   --   --   --   --   --   --   INR 1.8*  --   --   --   --   --   --   --   --   HEPARINUNFRC  --   --   --   --   --   --   --   --  >1.10*  CREATININE 1.81* 1.90*  --   --   --   --  1.35*  --   --   TROPONINIHS  --   --  508*  --  2,111* 3,384*  --   --   --      Estimated Creatinine Clearance: 41.8 mL/min (A) (by C-G formula based on SCr of 1.35 mg/dL (H)).   Medical History: Past Medical History:  Diagnosis Date   Atrial fibrillation (HCC)    CAD (coronary artery disease)    CVA (cerebral vascular accident) (HCC) 05/27/2013   GI bleed 05/27/2013   History of blood transfusion    GI Bleed   History of kidney stones    surgery to remove   HTN (hypertension)    Hx of CABG    Possibly 2015?   Hyperlipidemia    Pneumonia      Assessment: 88 yo M admitted for septic shock, acute encephalopathy and acute pancreatitis with rising troponins up to 2111. PMH significant for chronic SDH, chronic CVA. Patient is on rivaroxaban PTA, LD 8/5 @ 1930. INR elevated due to rivaroxaban. Pharmacy consulted for heparin.   Noted AKI, which will prolong rivaroxaban clearance. Follow aPTT levels until correlation with heparin level.  aPTT resulted  high 113 this AM. Heparin level elevated, as expected due to influence of rivaroxaban. Per discussion with RN, appears level needs to be redrawn to confirm accurate. CBC stable. No bleeding or issues with infusion per discussion with RN.  Goal of Therapy:  Heparin level 0.3-0.7 units/ml Monitor platelets by anticoagulation protocol: Yes   Plan:  Continue heparin at 1150 units/hr for now RN to recheck stat aPTT to confirm F/u aPTT until correlates with heparin level  Monitor daily aPTT, heparin level, CBC Monitor for signs/symptoms of bleeding    Leia Alf, PharmD, BCPS Please check AMION for all [facility] Pharmacy contact numbers Clinical Pharmacist 09/02/2021 4:50 AM  ADDENDUM - Stat repeat aPTT  resulted higher at 135, drawn appropriately per discussion with RN. No bleeding issues.  Plan: Hold heparin x 1 hr and resume at 0645 at reduced rate 950 units/hr Check 8hr heparin level from resumption Monitor daily CBC, s/sx bleeding   Leia Alf, PharmD, BCPS Please check AMION for all [facility] Pharmacy contact numbers Clinical Pharmacist 09/02/2021 5:48 AM

## 2021-09-02 NOTE — Progress Notes (Signed)
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  CT abdomen suggestive of pancreatitis, elevated LFTs and hypotensive requiring pressors, treated as sepsis -Elevated troponin to 20,000, heparin started. Levophed being tapered down.  On exam -critically ill, sedated on fentanyl and Precedex, RASS -3, S1-S2 irregular, soft nontender abdomen, bilateral clear breath sounds, 1+ edema.   Chest x-ray independently reviewed, no evidence of infiltrate or effusions.  Labs show slight decrease in creatinine, normal electrolytes, low procalcitonin, mild leukocytosis.  Impression/plan Septic shock -UA showed WBC clumps , empirically on Zosyn, can DC vancomycin, low procalcitonin reassuring -Hypotension may be related to vent and sedation.  Acute encephalopathy -EEG negative, head CT did not show stroke, ammonia level. Minimize sedation, goal RASS 0 to -1, wake-up assessment and I  Elevated LFTs and acute pancreatitis may have been related to pirfenidone -Lipase normal is reassuring, will start tube feeds if no procedure planned  Non-STEMI -cardiology to consult. Continue IV heparin and aspirin Problem echo report shows EF down to 30%  Acute on chronic hypercarbic respiratory failure IPF -Hold off on weaning today due to cardiac issues, but hopeful to extubate eventually.  Guarded prognosis given to daughter at the bedside  My independent critical care time was 35 minutes  Arshi Duarte V. AlvaM D

## 2021-09-02 NOTE — Progress Notes (Signed)
  Echocardiogram 2D Echocardiogram has been performed.  Calvin Brown 09/02/2021, 10:05 AM

## 2021-09-02 NOTE — Progress Notes (Signed)
ANTICOAGULATION CONSULT NOTE  Pharmacy Consult for heparin Indication: chest pain/ACS  No Known Allergies  Patient Measurements: Weight: 84.4 kg (186 lb 1.1 oz) Ht 5'11" Wt 75.9kg Heparin Dosing Weight: 76 kg  Vital Signs: Temp: 98.4 F (36.9 C) (08/07 1418) Temp Source: Bladder (08/07 1200) BP: 124/65 (08/07 1418) Pulse Rate: 85 (08/07 1418)  Labs: Recent Labs    09/01/21 1030 09/01/21 1041 09/01/21 1212 09/01/21 1230 09/01/21 1722 09/01/21 2047 09/01/21 2200 09/01/21 2300 09/02/21 0412 09/02/21 0455 09/02/21 1154 09/02/21 1444  HGB 12.8* 13.6  13.9  --  10.2*  --   --   --  11.9* 11.4*  --   --   --   HCT 43.1 40.0  41.0  --  30.0*  --   --   --  35.0* 35.3*  --   --   --   PLT 190  --   --   --   --   --   --   --  167  --   --   --   APTT  --   --   --   --   --   --   --   --  113* 131*  --  87*  LABPROT 20.9*  --   --   --   --   --   --   --   --   --   --   --   INR 1.8*  --   --   --   --   --   --   --   --   --   --   --   HEPARINUNFRC  --   --   --   --   --   --   --   --  >1.10* >1.10*  --   --   CREATININE 1.81* 1.90*  --   --   --   --  1.35*  --  1.25*  --   --   --   TROPONINIHS  --   --    < >  --  2,111* 3,384*  --   --   --   --  2,161*  --    < > = values in this interval not displayed.     Estimated Creatinine Clearance: 45.2 mL/min (A) (by C-G formula based on SCr of 1.25 mg/dL (H)).   Medical History: Past Medical History:  Diagnosis Date   Atrial fibrillation (HCC)    CAD (coronary artery disease)    CVA (cerebral vascular accident) (HCC) 05/27/2013   GI bleed 05/27/2013   History of blood transfusion    GI Bleed   History of kidney stones    surgery to remove   HTN (hypertension)    Hx of CABG    Possibly 2015?   Hyperlipidemia    Pneumonia      Assessment: 88 yo M admitted for septic shock, acute encephalopathy and acute pancreatitis with rising troponins up to 2111. PMH significant for chronic SDH, chronic CVA.  Patient is on rivaroxaban PTA, LD 8/5 @ 1930. INR elevated due to rivaroxaban. Pharmacy consulted for heparin.   Noted AKI, which will prolong rivaroxaban clearance. Follow aPTT levels until correlation with heparin level.  aPTT 87 sec (therapeutic) on infusion at 950 units/hr. No bleeding noted.  Goal of Therapy:  Heparin level 0.3-0.7 units/ml Monitor platelets by anticoagulation protocol: Yes   Plan:  Continue heparin at 950 units/hr  Will f/u 8 hr aPTT to confirm therapeutic  Christoper Fabian, PharmD, BCPS Please see amion for complete clinical pharmacist phone list 09/02/2021 4:19 PM

## 2021-09-02 NOTE — Progress Notes (Signed)
NAME:  Calvin Brown, MRN:  [id number], DOB:  12/17/1933, LOS: 1 ADMISSION DATE:  09/01/2021, CONSULTATION DATE:  09/01/21 REFERRING MD:  Adela Lank, CHIEF COMPLAINT:  AMS   History of Present Illness:  88 year old with PMH of CAD s/p CABG, IPF recently started on pirfenidone, atrial fibrillation on xarelto who is a retired Investment banker, operational BIBEMS after being found unresponsive by wife this morning, LKN 8/5 evening.  Initially hypotensive with EMS, given fluids, placed on nonrebreather .  Intubated for airway protection due to low GCS, hypothermic, started on Levophed and broad-spectrum antibiotics Labs show WBC count of 10, lactic 1.5, mild hyperkalemia, new renal failure BUN/creatinine 40/1.8, elevated LFTs VBG was 7.2 6/82/130 Head CT showed previous subdural hematoma and almost resolved, moderate chronic right MCA infarct, small vessel ischemic disease CT abdomen showed stranding around the pancreas consistent with acute pancreatitis with small volume ascites and small effusions , no hydronephrosis.   History obtained from wife and daughter and review of chart. He was started on pirfenidone about a month ago.  Recent UTI 2 weeks ago, started on Macrodantin by urology .  He had a fall at home on 7/30, complained of left shoulder pain, x-ray negative per orthopedics but since then his mental status has been declining with hallucinations, bedwetting and increasing weakness.  ED visit on 8/3 with negative head CT , VBG was 7.43/57 , no leukocytosis, normal renal function. Ambien was started 8/3.  Pertinent  Medical History  CAD s/p CABG,  IPF on pirfenidone, follows with Dr. Everardo All Chronic bronchitis, remote smoker, on Breo 07/08/21 FVC 2.51 (65%) FEV1 1.97 (73%) Ratio 73  TLC 74% DLCO 54% Interpretation: Moderate restrictive defect with moderately reduced DLCO HTN,  CVA,  atrial fibrillation Xarelto  Subdural hematoma 03/2021 requiring embolization of meningeal artery    Significant Hospital  Events: Including procedures, antibiotic start and stop dates in addition to other pertinent events   7/30 fall  8/3 ED visit 8/6 admitted for unresponsiveness, CVC placed  Interim History / Subjective:  Short run of vtach, Increasing troponins overnight, cards consulted. Agitated, precedex started.  Objective   Blood pressure (!) 77/31, pulse 79, temperature (!) 97.5 F (36.4 C), resp. rate 15, weight 82.3 kg, SpO2 100 %.    Vent Mode: PRVC FiO2 (%):  [50 %-100 %] 50 % Set Rate:  [15 bmp-20 bmp] 15 bmp Vt Set:  [600 mL] 600 mL PEEP:  [5 cmH20] 5 cmH20 Plateau Pressure:  [17 cmH20-20 cmH20] 20 cmH20   Intake/Output Summary (Last 24 hours) at 09/02/2021 0739 Last data filed at 09/02/2021 0700 Rohman per 24 hour  Intake 6814.29 ml  Output 1885 ml  Net 4929.29 ml   Filed Weights   09/02/21 0241  Weight: 82.3 kg    Examination: General:  elderly man, orally intubated, sedated on fentanyl and precedex HENT: NCAT ETT in place PULM: CTAB, vent supported breathing CV: RRR, no mgr GI: BS+, soft, nontender MSK: normal bulk and tone, trace edema to lower extremities Neuro: sedated on vent   Creatinine 1.25  K 3.9 BUN 33 WBC 11.6 Hgb 11.4 Acute hepatitis panel negative Pro-cal 0.26-> 0.42 Blood culture ngtd Urine culture ngtd Troponin 3,384 FeNA 0.4%  EEG showed generalized non-specific cerebral dysfunction. There was no seizure predisposition. CT abd/pelvis: peripancreatic fat stranding and fluid consistent with acute pancreatitis  Resolved Hospital Problem list     Assessment & Plan:   Acute metabolic encephalopathy Presented with 2 week history of worsening mentation. Likely multifactorial in setting  of medications leading to hypercarbia. He was intubated for airway protection and remains on low vent settings. Low suspicion for infectious cause for encephalopathy, blood/urine culture pending procal < 0.5. CT head showed chronic right MCA infarct. EEG was negative for  seizure.  -titrate down fentanyl, precedex to see if mentation improved -consider MRI if mentation has not improved  NSTEMI Prior history of CAD s/p CABG 1992 (LIMA to LAD and SVG to OM). Nuclear stress test in April 2022 showed inferior and lateral fixed defects with possibility of peri-infarct ischemia at that time he refused invasive angiography. Echo 2022 with EF 55%. Troponin increased to 3000 with T wave inversion in anterolateral leads 8/6. Cardiology to see this AM. -heparin gtt -asa, lipitor per tube -echo pending -Dr. Odis Hollingshead consulted  Septic shock He was recently treated for UTI with macrobid. UA WBC clumps. MRSA negative. He has remained afebrile, WBC from 10.2 to 11.4.  -urine/ blood culture ngtd -covering empirically with zosyn, day 2 -discontinue vancomycin -levophed with MAP goal > 65  Acute respiratory failure Chronic hypercarbic respiratory failure Underlying IPF , PFTs have not shown significant airway obstruction Repeat ABG with 7.4/ 39.9/ 220/ 27.3. If cardiology plans for intervention then will keep with ventilation -provana  AKI FeNA 0.4% consistent with pre-renal. Creatinine improving today from 1.9 to 1.25.  -discontinue fluids -trend CMP  Acute pancreatitis Elevated LFTs CT showed signs concerning for pancreatitis, however lipase was wnl.  -trend CMP   Best Practice (right click and "Reselect all SmartList Selections" daily)   Diet/type: NPO DVT prophylaxis: systemic heparin GI prophylaxis: PPI Lines: Central line Foley:  Yes, and it is still needed Code Status:  full code Last date of multidisciplinary goals of care discussion [8/6]  Laurianne Floresca M. [place], D.O.  Internal Medicine Resident, PGY-2 [facility]  Pager: [phone] 7:39 AM, 09/02/2021

## 2021-09-02 NOTE — Progress Notes (Signed)
ANTICOAGULATION CONSULT NOTE  Pharmacy Consult for heparin Indication: chest pain/ACS  No Known Allergies  Patient Measurements: Weight: 84.4 kg (186 lb 1.1 oz) Ht 5'11" Wt 75.9kg Heparin Dosing Weight: 76 kg  Vital Signs: Temp: 99.6 F (37.6 C) (08/07 1944) Temp Source: Oral (08/07 1944) BP: 111/69 (08/07 1939) Pulse Rate: 84 (08/07 2200)  Labs: Recent Labs    09/01/21 1030 09/01/21 1041 09/01/21 1212 09/01/21 1230 09/01/21 1722 09/01/21 2047 09/01/21 2200 09/01/21 2300 09/02/21 0412 09/02/21 0412 09/02/21 0455 09/02/21 1154 09/02/21 1444 09/02/21 2149  HGB 12.8* 13.6  13.9  --  10.2*  --   --   --  11.9* 11.4*  --   --   --   --   --   HCT 43.1 40.0  41.0  --  30.0*  --   --   --  35.0* 35.3*  --   --   --   --   --   PLT 190  --   --   --   --   --   --   --  167  --   --   --   --   --   APTT  --   --   --   --   --   --   --   --  113*   < > 131*  --  87* 80*  LABPROT 20.9*  --   --   --   --   --   --   --   --   --   --   --   --   --   INR 1.8*  --   --   --   --   --   --   --   --   --   --   --   --   --   HEPARINUNFRC  --   --   --   --   --   --   --   --  >1.10*  --  >1.10*  --   --   --   CREATININE 1.81* 1.90*  --   --   --   --  1.35*  --  1.25*  --   --   --   --   --   TROPONINIHS  --   --    < >  --  2,111* 3,384*  --   --   --   --   --  2,161*  --   --    < > = values in this interval not displayed.     Estimated Creatinine Clearance: 45.2 mL/min (A) (by C-G formula based on SCr of 1.25 mg/dL (H)).   Medical History: Past Medical History:  Diagnosis Date   Atrial fibrillation (HCC)    CAD (coronary artery disease)    CVA (cerebral vascular accident) (HCC) 05/27/2013   GI bleed 05/27/2013   History of blood transfusion    GI Bleed   History of kidney stones    surgery to remove   HTN (hypertension)    Hx of CABG    Possibly 2015?   Hyperlipidemia    Pneumonia      Assessment: 88 yo M admitted for septic shock, acute  encephalopathy and acute pancreatitis with rising troponins up to 2111. PMH significant for chronic SDH, chronic CVA. Patient was on rivaroxaban PTA, LD 8/5 @ 1930. INR elevated due to rivaroxaban. Pharmacy  consulted for heparin.   AKI will prolong rivaroxaban clearance. Following aPTT levels until correlation with heparin level.  aPTT 87 sec (therapeutic) on infusion at 950 units/hr earlier today, so rate continued. Confirmatory level back at 80.  No issues with bleeding or with infusion per RN  Hgb 11.4; plt 167  Goal of Therapy:  Heparin level 0.3-0.7 units/ml Monitor platelets by anticoagulation protocol: Yes   Plan:  Continue heparin at 950 units/hr  F/u AM aPTT and HL  Delmar Landau, PharmD, BCPS 09/02/2021 10:41 PM ED Clinical Pharmacist -  [phone]

## 2021-09-02 NOTE — Consult Note (Signed)
CARDIOLOGY CONSULT NOTE  Patient ID: Calvin Brown MRN: [id number] DOB/AGE: [birth year] 86 y.o.  Admit date: 09/01/2021 Attending physician: Rigoberto Noel, MD Primary Physician:  Sueanne Margarita, DO Outpatient Cardiologist: Rex Kras, DO, [facility] Inpatient Cardiologist: Rex Kras, DO, [facility]  Reason of consultation: NSTEMI Referring physician: Rigoberto Noel, MD  Chief complaint: altered mental status.   HPI:  Calvin Brown is a 88 y.o. 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  Patient is currently intubated and sedated and history of present illness is obtained as a result of talking to his wife Calvin Brown, daughter Calvin Brown, and son Calvin Brown.  Patient was last seen in the office in May 2023 and thereafter has undergone procedures for nephrolithiasis, followed by urinary tract infection treated with antibiotics, care complicated by visual hallucinations, bedwetting at night, cognitive decline, recent fall in July 2023 due to visual hallucinations and last ER visit on 08/29/2021.  Patient was last known to be at baseline on Saturday night 08/31/2021.  When his wife went to wake him up on Sunday morning he was difficult to arouse and due to altered mental status was brought to the hospital for further evaluation and management.  He was intubated for airway protection and currently on broad-spectrum antibiotics, vasopressin, and IV heparin.  Cardiology was consulted for NSTEMI evaluation.  Prior  to his hospitalization patient did not complain of chest pain at rest or with effort related activities.  And did not have symptoms to suggest heart failure exacerbation.  Patient does have baseline CAD with prior CABG in 1992 at an outside facility.  Prior to establishing care with myself he was under the care of Dr. Sabra Heck back in [place] at that time nuclear stress test reported possibility of peri-infarct ischemia the patient chose not to undergo invasive angiography.  Since then on multiple office visits patient has refused stress test or angiography to evaluate for obstructive CAD.  ALLERGIES: No Known Allergies  PAST MEDICAL HISTORY: Past Medical History:  Diagnosis Date   Atrial fibrillation ([place])    CAD (coronary artery disease)    CVA (cerebral vascular accident) ([place]) 05/27/2013   GI bleed 05/27/2013   History of blood transfusion    GI Bleed   History of kidney stones    surgery to remove   HTN (hypertension)    Hx of CABG    Possibly 2015?   Hyperlipidemia    Pneumonia     PAST SURGICAL HISTORY: Past Surgical History:  Procedure Laterality Date   CORONARY ARTERY BYPASS GRAFT  1992   CYSTOSCOPY/URETEROSCOPY/HOLMIUM LASER/STENT PLACEMENT Left 07/02/2021   Procedure: LEFT URETEROSCOPY/HOLMIUM LASER retrograde pylegram left stent placement;  Surgeon: Vira Agar, MD;  Location: [facility];  Service: Urology;  Laterality: Left;   ESOPHAGOGASTRODUODENOSCOPY     IR ANGIO EXTERNAL CAROTID SEL EXT CAROTID UNI L MOD SED  05/03/2021   IR ANGIO INTRA EXTRACRAN SEL INTERNAL CAROTID UNI L MOD SED  05/03/2021   IR ANGIOGRAM FOLLOW UP STUDY  05/03/2021   IR NEURO EACH ADD'L AFTER BASIC UNI LEFT ([facility])  05/03/2021   IR TRANSCATH/EMBOLIZ  05/03/2021   RADIOLOGY WITH ANESTHESIA N/A 05/03/2021   Procedure: IR WITH ANESTHESIA;  Surgeon: Consuella Lose, MD;  Location: [facility];  Service: Radiology;  Laterality: N/A;   TONSILLECTOMY AND ADENOIDECTOMY      FAMILY HISTORY: The patient's family  history includes Breast cancer (age of onset: 40) in his mother; Heart attack (age of onset: 37) in his sister; Heart disease in his sister; Hypertension in his father; Vision loss in his mother.   SOCIAL HISTORY:  The patient  reports that he quit smoking about 54 years ago. His smoking use included cigarettes. He started smoking about 66 years ago. He has a 24.00 pack-year smoking history. He has never used smokeless tobacco. He reports that he does not currently use alcohol. He reports that he does not use drugs.  MEDICATIONS: Current Outpatient Medications  Medication Instructions   acetaminophen (TYLENOL) 325-650 mg, Oral, Every 4 hours PRN   AMBULATORY NON FORMULARY MEDICATION Medication Name: Diltiazem ointment - Using your index finger, apply a small amount of medication inside the rectum up to your first knuckle/joint three times daily x 6-8 weeks.   ezetimibe (ZETIA) 10 mg, Oral, Daily   famotidine (PEPCID) 20 mg, Every 8 hours   fluticasone furoate-vilanterol (BREO ELLIPTA) 100-25 MCG/ACT AEPB 1 puff, Inhalation, Daily   ibandronate (BONIVA) 150 mg, Oral, Every 30 days   linaclotide (LINZESS) 145 mcg, Oral, Daily before breakfast   lisinopril (ZESTRIL) 20 MG tablet TAKE 1 TABLET BY MOUTH EVERY DAY IN THE MORNING   Melatonin 10 mg, Daily at bedtime   Multiple Vitamin (MULTIVITAMIN WITH MINERALS) TABS tablet 1 tablet, Oral, Every morning   omeprazole (PRILOSEC) 20 mg, Oral, Daily   Pirfenidone (ESBRIET) 267 MG TABS Take 1 tab three times daily for 7 days, then 2 tabs three times daily for 7 days, then 3 tabs three times daily thereafter.   Pirfenidone (ESBRIET) 801 mg, Oral, 3 times daily   Potassium 198 mg, Oral, Every morning   rivaroxaban (XARELTO) 20 mg, Oral, Daily with supper   rosuvastatin (CRESTOR) 20 mg, Oral, Daily at bedtime   sacubitril-valsartan (ENTRESTO) 49-51 MG 0.5 tablets, Oral, 2 times daily   tamsulosin (FLOMAX) 0.8 mg, Oral, Daily   torsemide (DEMADEX) 5 mg,  Oral, Every morning   Vitamin D 5,000 Units, Oral, Every morning   zolpidem (AMBIEN) 5 mg, Oral, At bedtime PRN    REVIEW OF SYSTEMS: Review of Systems  Unable to perform ROS: Intubated   PHYSICAL EXAM: Temp:  [94.8 F (34.9 C)-100.4 F (38 C)] 97.2 F (36.2 C) (08/07 1107) Pulse Rate:  [65-110] 87 (08/07 1107) Cardiac Rhythm: Atrial fibrillation (08/07 0800) Resp:  [15-23] 15 (08/07 1107) BP: (77-116)/(31-67) 116/64 (08/07 1107) SpO2:  [94 %-100 %] 100 % (08/07 1108) Arterial Line BP: (86-146)/(43-77) 124/66 (08/07 0745) FiO2 (%):  [40 %-60 %] 40 % (08/07 1108) Weight:  [82.3 kg-84.4 kg] 84.4 kg (08/07 1300)  Today's Vitals   09/02/21 0759 09/02/21 1107 09/02/21 1108 09/02/21 1300  BP:  116/64    Pulse:  87    Resp:  15    Temp:  (!) 97.2 F (36.2 C)    TempSrc:      SpO2: 100% 100% 100%   Weight:    84.4 kg   Body mass index is 25.95 kg/m.   Intake/Output Summary (Last 24 hours) at 09/02/2021 1317 Last data filed at 09/02/2021 1200 [place] per 24 hour  Intake 3120.63 ml  Output 1890 ml  Net 1230.63 ml    Net IO Since Admission: 5,015.63 mL [09/02/21 1317]  CONSTITUTIONAL: Appears older than stated age, frail, temporal wasting, intubated and sedated.  SKIN: Skin is warm and dry. No rash noted. No cyanosis. No pallor. No jaundice HEAD: Normocephalic and atraumatic.  EYES: No scleral icterus MOUTH/THROAT: Dry mucous membranes, ET and OG tube in place NECK: No JVD present. No thyromegaly noted.  Right carotid bruit CHEST Normal respiratory effort. No intercostal retractions  LUNGS: Breath sounds bilaterally, equal rise and fall of chest cavity, No stridor. No wheezes. No rales.  CARDIOVASCULAR: Irregularly irregular, variable S1-S2, no murmurs rubs or gallops appreciated.   ABDOMINAL: Soft, nontender, nondistended, positive bowel sounds in all 4 quadrants, no apparent ascites.  EXTREMITIES: No pitting edema, warm to touch.  Ecchymosis present bilaterally.  Left A-line  present.  Right glove present. HEMATOLOGIC: No significant bruising NEUROLOGIC: Moving all 4 extremities, intubated and sedated, opens eyes to verbal stimuli PSYCHIATRIC: Intubated and sedated  RADIOLOGY: ECHOCARDIOGRAM COMPLETE  Result Date: 09/02/2021    ECHOCARDIOGRAM REPORT   Patient Name:   Calvin Brown Date of Exam: 09/02/2021 Medical Rec #:  [id number]   Height:       71.0 in Accession #:    [id number]  Weight:       181.4 lb Date of Birth:  March 10, 1933   BSA:          2.023 m Patient Age:    89 years    BP:           83/54 mmHg Patient Gender: M           HR:           98 bpm. Exam Location:  Inpatient Procedure: 2D Echo, Cardiac Doppler, Color Doppler and Intracardiac            Opacification Agent Indications:    Acute ischemic heart disease, unspecified I24.9  History:        Patient has prior history of Echocardiogram examinations, most                 recent 01/29/2021. CAD, Prior CABG, Stroke, Arrythmias:Atrial                 Fibrillation; Risk Factors:Hypertension and Dyslipidemia.  Sonographer:    Eartha Inch Referring Phys: Hayden Pedro, Jerilynn Mages  Sonographer Comments: Echo performed with patient supine and on artificial respirator. Image acquisition challenging due to respiratory motion. ICU patient. IMPRESSIONS  1. Left ventricular ejection fraction, by estimation, is 45 to 50%. The left ventricle has mildly decreased function. The left ventricle demonstrates global hypokinesis. There is mild left ventricular hypertrophy of the basal-septal segment. Left ventricular diastolic parameters are indeterminate. There is the interventricular septum is flattened in systole and diastole, consistent with right ventricular pressure and volume overload.  2. Right ventricular systolic function is severely reduced. The right ventricular size is mildly enlarged.  3. Left atrial size was moderately dilated.  4. Right atrial size was moderately dilated.  5. The mitral valve is normal in structure. Mild mitral  valve regurgitation. No evidence of mitral stenosis.  6. Tricuspid valve regurgitation is mild to moderate.  7. The aortic valve is tricuspid. Aortic valve regurgitation is mild to moderate. No aortic stenosis is present.  8. Aortic dilatation noted. There is mild dilatation of the ascending aorta, measuring 40 mm.  9. The inferior vena cava is normal in size with greater than 50% respiratory variability, suggesting  right atrial pressure of 3 mmHg. Comparison(s): Prior images reviewed side by side. FINDINGS  Left Ventricle: Left ventricular ejection fraction, by estimation, is 45 to 50%. The left ventricle has mildly decreased function. The left ventricle demonstrates global hypokinesis. Definity contrast agent was given IV to delineate the left ventricular  endocardial borders. The left ventricular internal cavity size was normal in size. There is mild left ventricular hypertrophy of the basal-septal segment. The interventricular septum is flattened in systole and diastole, consistent with right ventricular pressure and volume overload. Left ventricular diastolic parameters are indeterminate. Right Ventricle: The right ventricular size is mildly enlarged. No increase in right ventricular wall thickness. Right ventricular systolic function is severely reduced. Left Atrium: Left atrial size was moderately dilated. Right Atrium: Right atrial size was moderately dilated. Pericardium: There is no evidence of pericardial effusion. Mitral Valve: The mitral valve is normal in structure. Mild mitral valve regurgitation. No evidence of mitral valve stenosis. Tricuspid Valve: The tricuspid valve is normal in structure. Tricuspid valve regurgitation is mild to moderate. No evidence of tricuspid stenosis. Aortic Valve: The aortic valve is tricuspid. Aortic valve regurgitation is mild to moderate. No aortic stenosis is present. Pulmonic Valve: The pulmonic valve was normal in structure. Pulmonic valve regurgitation is mild. No  evidence of pulmonic stenosis. Aorta: Aortic dilatation noted. There is mild dilatation of the ascending aorta, measuring 40 mm. Venous: The inferior vena cava is normal in size with greater than 50% respiratory variability, suggesting right atrial pressure of 3 mmHg. IAS/Shunts: No atrial level shunt detected by color flow Doppler.  LEFT VENTRICLE PLAX 2D LVIDd:         4.40 cm   Diastology LVIDs:         3.70 cm   LV e' medial:    9.37 cm/s LV PW:         0.90 cm   LV E/e' medial:  4.4 LV IVS:        1.40 cm   LV e' lateral:   16.90 cm/s LVOT diam:     2.30 cm   LV E/e' lateral: 2.4 LV SV:         49 LV SV Index:   24 LVOT Area:     4.15 cm  RIGHT VENTRICLE RV S prime:     11.10 cm/s TAPSE (M-mode): 0.5 cm LEFT ATRIUM             Index        RIGHT ATRIUM           Index LA diam:        4.90 cm 2.42 cm/m   RA Area:     22.30 cm LA Vol (A2C):   62.6 ml 30.94 ml/m  RA Volume:   60.00 ml  29.66 ml/m LA Vol (A4C):   90.2 ml 44.59 ml/m LA Biplane Vol: 75.1 ml 37.12 ml/m  AORTIC VALVE             PULMONIC VALVE LVOT Vmax:   75.00 cm/s  PR End Diast Vel: 9.49 msec LVOT Vmean:  53.100 cm/s LVOT VTI:    0.117 m  AORTA Ao Root diam: 3.10 cm Ao Asc diam:  4.00 cm MITRAL VALVE               TRICUSPID VALVE MV Area (PHT): 5.20 cm    TR Peak grad:   21.3 mmHg MV Decel Time: 146 msec    TR Vmax:  231.00 cm/s MV E velocity: 41.40 cm/s                            SHUNTS                            Systemic VTI:  0.12 m                            Systemic Diam: 2.30 cm Candee Furbish MD Electronically signed by Candee Furbish MD Signature Date/Time: 09/02/2021/11:26:59 AM    Final    EEG adult  Result Date: 09/01/2021 Greta Doom, MD     09/01/2021  5:21 PM History: 88 yo M being evaluated for acute encephalopathy Sedation: fentanyl Technique: This EEG was acquired with electrodes placed according to the International 10-20 electrode system (including Fp1, Fp2, F3, F4, C3, C4, P3, P4, O1, O2, T3, T4, T5, T6, A1, A2,  Fz, Cz, Pz). The following electrodes were missing or displaced: none. Background: The background consists of intermixed irregular delta and theta range activities. There are occiasional generalized discharges with triphasic morphology without evidence of evolution or other concerning features. No definite epileptiform discharges were seen. Posterior dominant rhythm was not visualized. Photic stimulation: Physiologic driving is not performed. EEG Abnormalities: 1) Triphasic waves 2) Generalized irregular slow activity 3) Absent PDR Clinical Interpretation: This EEG is consistent with a generalized non-specific cerebral dysfunction(encephalopathy). There was no seizure or seizure predisposition recorded on this study. Please note that lack of epileptiform activity on EEG does not preclude the possibility of epilepsy. Roland Rack, MD [facility] [phone] If 7pm- 7am, please page neurology on call as listed in [place].   DG Chest Port 1 View  Result Date: 09/01/2021 CLINICAL DATA:  Central line placement. EXAM: PORTABLE CHEST 1 VIEW COMPARISON:  09/01/2021 at 11:33 a.m. FINDINGS: New right internal jugular central venous line has its tip projecting in the upper aspect of the right atrium. Endotracheal tube and nasal/orogastric tube are stable. Elevated right hemidiaphragm. Mild lung base opacities consistent with atelectasis. Remainder of the lungs is clear. No pneumothorax. IMPRESSION: 1. Right internal jugular central venous line catheter tip projects near the caval atrial junction, likely in the superior aspect of the right atrium. 2. No pneumothorax. 3. No other change from the earlier study. Electronically Signed   By: Lajean Manes M.D.   On: 09/01/2021 16:16   CT ABDOMEN PELVIS WO CONTRAST  Result Date: 09/01/2021 CLINICAL DATA:  Hepatitis, unwitnessed fall EXAM: CT ABDOMEN AND PELVIS WITHOUT CONTRAST TECHNIQUE: Multidetector CT imaging of the abdomen and pelvis was performed  following the standard protocol without IV contrast. RADIATION DOSE REDUCTION: This exam was performed according to the departmental dose-optimization program which includes automated exposure control, adjustment of the mA and/or kV according to patient size and/or use of iterative reconstruction technique. COMPARISON:  06/05/2021 FINDINGS: Lower chest: Small bilateral pleural effusions and associated atelectasis or consolidation. Cardiomegaly. Three-vessel coronary artery calcifications Hepatobiliary: No solid liver abnormality is seen. No gallstones, gallbladder wall thickening, or biliary dilatation. Pancreas: Diffuse peripancreatic fat stranding and fluid. No evident pancreatic ductal dilatation or acute pancreatic fluid collection. Spleen: Normal in size without significant abnormality. Adrenals/Urinary Tract: Adrenal glands are unremarkable. Small nonobstructive left renal calculi. Mildly patulous appearance of the left renal pelvis, unchanged compared to prior. Simple, benign right renal cyst, for which no further follow-up  or characterization is required. Bilateral perinephric fat stranding, increased compared to prior examination. Foley catheter in the bladder. Stomach/Bowel: Esophagogastric tube with tip and side port below the diaphragm, tip in the gastric fundus. Stomach is within normal limits. Appendix is not clearly visualized. No evidence of bowel wall thickening, distention, or inflammatory changes. Vascular/Lymphatic: Aortic atherosclerosis. No enlarged abdominal or pelvic lymph nodes. Reproductive: No mass or other significant abnormality. Other: No abdominal wall hernia or abnormality. Small volume perihepatic and perisplenic ascites. Musculoskeletal: No acute or significant osseous findings. IMPRESSION: 1. Diffuse peripancreatic fat stranding and fluid, consistent with acute pancreatitis. No evident pancreatic ductal dilatation or acute pancreatic fluid collection on noncontrast examination. 2.  Small volume ascites. 3. Small bilateral pleural effusions and associated atelectasis or consolidation. 4. Small nonobstructive left renal calculi. Mildly patulous appearance of the left renal pelvis, unchanged compared to prior examination. No ureteral calculi or overt hydronephrosis. 5. Bilateral perinephric fat stranding, increased compared to prior examination possibly reactive to adjacent pancreatitis. Correlate with urinalysis to exclude urinary tract infection. 6. Coronary artery disease. Aortic Atherosclerosis (ICD10-I70.0). Electronically Signed   By: Delanna Ahmadi M.D.   On: 09/01/2021 13:16   CT Head Wo Contrast  Result Date: 09/01/2021 CLINICAL DATA:  Head trauma.  Unwitnessed fall.  On Xarelto. EXAM: CT HEAD WITHOUT CONTRAST TECHNIQUE: Contiguous axial images were obtained from the base of the skull through the vertex without intravenous contrast. RADIATION DOSE REDUCTION: This exam was performed according to the departmental dose-optimization program which includes automated exposure control, adjustment of the mA and/or kV according to patient size and/or use of iterative reconstruction technique. COMPARISON:  Head CT 08/29/2021 FINDINGS: Brain: There is no evidence of an acute infarct, acute intracranial hemorrhage, mass, or midline shift. At most trace residual low-density subdural fluid remains over the left frontal convexity. There is a moderate-sized chronic right MCA infarct involving the anterior insula and frontal operculum with ex vacuo dilatation of the frontal horn of the right lateral ventricle. Hypodensities elsewhere in the cerebral white matter bilaterally are unchanged and nonspecific but compatible with mild chronic small vessel ischemic disease. Vascular: Calcified atherosclerosis at the skull base. Prior left middle meningeal artery embolization. Skull: No fracture or suspicious osseous lesion. Sinuses/Orbits: Mild mucosal thickening in the ethmoid sinuses. Clear mastoid air  cells. Bilateral cataract extraction. Other: None. IMPRESSION: 1. No evidence of acute intracranial abnormality. 2. Chronic findings as above. Electronically Signed   By: Logan Bores M.D.   On: 09/01/2021 13:14   DG Chest Portable 1 View  Result Date: 09/01/2021 CLINICAL DATA:  Status post intubation. EXAM: PORTABLE CHEST 1 VIEW COMPARISON:  08/29/2021 FINDINGS: 1133 hours. Endotracheal tube tip is 5.6 cm above the base of the carina. NG tube is looped in the stomach with tip overlying the gastric cardia. No edema or pneumothorax. No substantial right pleural effusion. There is some basilar atelectasis bilaterally with possible tiny left effusion. Telemetry leads overlie the chest. IMPRESSION: 1. Endotracheal tube tip 5.6 cm above the carina. 2. NG tube tip is in the stomach. Electronically Signed   By: Misty Stanley M.D.   On: 09/01/2021 12:02    LABORATORY DATA: Lab Results  Component Value Date   WBC 11.6 (H) 09/02/2021   HGB 11.4 (L) 09/02/2021   HCT 35.3 (L) 09/02/2021   MCV 86.3 09/02/2021   PLT 167 09/02/2021    Recent Labs  Lab 09/01/21 1030 09/01/21 1041 09/02/21 0412  NA 140   < > 140  K 5.6*   < >  3.9  CL 99   < > 111  CO2 32   < > 23  BUN 40*   < > 33*  CREATININE 1.81*   < > 1.25*  CALCIUM 8.6*   < > 7.6*  PROT 6.1*  --   --   BILITOT 0.8  --   --   ALKPHOS 66  --   --   ALT 928*  --   --   AST 795*  --   --   GLUCOSE 132*   < > 107*   < > = values in this interval not displayed.    Lipid Panel  Lab Results  Component Value Date   CHOL 96 (L) 12/31/2020   HDL 41 12/31/2020   LDLCALC 38 12/31/2020   LDLDIRECT 38 12/31/2020   TRIG 24 09/01/2021    BNP (last 3 results) Recent Labs    01/29/21 0932 04/15/21 1639  BNP 162.7* 224.3*    HEMOGLOBIN A1C No results found for: "HGBA1C", "MPG"  Cardiac Panel (last 3 results) Recent Labs    09/01/21 1212 09/01/21 1722 09/01/21 2047  TROPONINIHS 508* 2,111* 3,384*     TSH No results for input(s):  "TSH" in the last 8760 hours.   RADIOLOGY: April 23, 2021: CT of the head without contrast: No acute intracranial findings are seen in noncontrast CT brain. Old infarct is seen in the right frontal lobe in the right MCA distribution. Atrophy.  There is large subcutaneous hematoma in the right posterior parietal scalp. No fracture is seen in the calvarium.   April 21, 2021: CT of the head without contrast Interval development of an epidural fluid collection overlying the  left cerebral hemisphere, likely reflecting subacute blood products. No internal brain herniation.   April 22, 2021: CT of the head without contrast 1. Unchanged low to intermediate density subdural collection along the left convexity, measuring up to 7 mm in thickness and probably a hematoma. No midline shift. 2. Right posterior scalp hematoma.   May 03, 2021: IR transcatheter/embolization 1. Successful onyx embolization of the left middle meningeal artery for chronic subdural hematoma  CARDIAC DATABASE: EKG: 05/31/2021: Atrial fibrillation, 84 bpm, without underlying injury pattern.   09/01/2021:  1047: Atrial fibrillation, 76 bpm, without underlying injury pattern. 19:52 atrial fibr injuryillation, 86 bpm, T wave inversions in the anterior precordial leads suggestive of ischemia, no ST elevation.  Compared to prior ECG T wave inversions are new.  Echocardiogram: 04/16/2020: LVEF 55-60%, inferior basal hypokinesis, mildly dilated LV cavity, mild LVH, RV size and function normal, severely dilated left atrium, mild MR, moderate TR, mild AI, mild to moderate aortic valve sclerosis without stenosis, ascending aorta 39 mm.   01/29/2021: LVEF 55%, severely dilated left atrium, moderate AI, mild MR, moderate TR, RVSP 50 mmHg, mild to moderate pulmonic regurgitation.  09/02/2021: LVEF 45-50%, mildly reduced LVEF, global hypokinesis, basal septal hypertrophy, IVS flattened in both systole and diastole consistent with RV pressure  and volume overload, right ventricular systolic function severely reduced in size mildly dilated, LA moderately dilated, RA moderately dilated, mild to moderate TR, mild to moderate AR, proximal ascending aorta 40 mm, RAP 3 mmHg.   Stress Testing: Last MPI Jun April 2022 with Dr. Hyacinth Meeker in [place],: Per report: Perfusion study is abnormal, fixed perfusion defect seen on imaging, mild reversibility. Poor exercise tolerance. No exercise-induced arrhythmia. Normal exercise blood pressure response. No exercise-induced ECG changes. Anterolateral infarct. Mild anterolateral hypokinesis LVEF calculated 55%. Mild anterolateral hypokinesis.  Fixed  inferior lateral perfusion defect with some reversibility peri-infarct.  Mildly abnormal scan   Heart Catheterization: 5 to 6 years ago, per patient we will request records.   Carotid artery duplex 05/16/2021: Duplex suggests stenosis in the right internal carotid artery (16-49%). Duplex suggests stenosis in the right external carotid artery (<50%). Duplex suggests stenosis in the left internal carotid artery (50-69%). Duplex suggests stenosis in the left external carotid artery (<50%). Antegrade right vertebral artery flow. Antegrade left vertebral artery flow. Significant change since 10/23/2020. Follow up in six months is appropriate if clinically indicated.  IMPRESSION & RECOMMENDATIONS: Calvin Brown is a 88 y.o. Caucasian male who is a retired Investment banker, operational whose past medical history and cardiovascular risk factors include: Hypertension, hyperlipidemia, CVA in 2015, persistent atrial fibrillation, CAD status post CABG in 1992 (LIMA to the LAD and SVG to OM) at age of 70, HFrEF, history of GI bleed secondary to AVM in 2015, scalp hematoma (03/2021), subdural hematoma s/p left middle meningeal artery embolization (April 2023), aortic atherosclerosis, former smoker, idiopathic pulmonary fibrosis, advanced age.  Impression:  NSTEMI  Septic  shock Acute encephalopathy Biventricular heart failure  Coronary artery disease with prior CABG without angina pectoris. Idiopathic pulmonary fibrosis Persistent atrial fibrillation (likely permanent) Long-term oral anticoagulation. History of GI bleed secondary to AVM in 2005. History of subdural hematoma status post left middle meningeal artery embolization (April 2023)  Plan:  NSTEMI: Patient presents to the hospital with a chief complaint of altered mental status.  Currently being treated for septic shock likely urinary source.  Blood work illustrates elevated high sensitive troponin (not peaked) and EKG changes shows T wave inversions in leads V1-V3 which are new compared to prior ECGs.   Spoke to the patient's wife, son Greig Castilla, and daughter Calvin Brown at length and explained to them the findings of the high sensitive troponin, EKG changes, and mildly reduced LVEF.  Patient has global hypokinesis with regionality involving the septum which could be likely secondary to postoperative changes versus obstructive CAD.  Prior to this current hospitalization he did not have anginal discomfort either at rest or with effort related activities and no acute heart failure exacerbation symptoms.  In the setting of septic shock high sensitive troponin and EKG changes may very well indicate supply demand ischemia; however, underlying obstructive CAD cannot be ruled out.  After discussing with family both conservative options versus invasive angiography they would like to proceed with conservative options for now as Dr. Michaell Cowing did not want to undergo heart catheterization in the past.  However, family states that if the troponins continue to trend up and/or when extubated if he has chest pain they may reconsider invasive angiography.  For now we will continue IV heparin drip per ACS protocol.  Clinically does not appear to be volume overloaded; however, echocardiogram notes right ventricular pressure and  volume overload, right ventricular size dilated and reduced function.  Right ventricular findings may be secondary to his underlying interstitial pulmonary fibrosis and the degree of pulmonary hypertension.  I did speak to critical care medicine with regards to considering the possibility of pulmonary embolism given his altered mental status.  However, since he has been on oral anticoagulation for A-fib the overall probability is low and given his acute kidney injury on presentation we will continue treating with IV heparin and avoid contrast exposure.  We will defer the management of vent dependent respiratory failure, septic shock, acute encephalopathy and acute hypercarbic respiratory failure in the setting of IPF to critical care  medicine/pulmonary medicine.  I agree with critical care medicine that overall prognosis is guarded given his comorbidities, presentation, recurrent hospitalization since January 2023, and current clinical course.  We will continue to follow the patient with you.  Plan of care discussed with wife, daughter, son, medicine resident Dr. Howie Ill, touched bases with intensivist.    CRITICAL CARE Performed by: Rex Kras   Total critical care time: 55 minutes   Critical care time was exclusive of separately billable procedures and treating other patients.   Critical care was necessary to treat or prevent imminent or life-threatening deterioration given his NSTEMI.   Critical care was time spent personally by me on the following activities: development of treatment plan with patient's wife, son, and daughter as well as nursing, discussions with consultants, evaluation of patient's response to treatment, examination of patient, obtaining history from patient or surrogate, ordering and performing treatments and interventions, ordering and review of laboratory studies, ordering and review of radiographic studies, pulse oximetry and re-evaluation of patient's condition.  This  note was created using a voice recognition software as a result there may be grammatical errors inadvertently enclosed that do not reflect the nature of this encounter. Every attempt is made to correct such errors.  Mechele Claude [facility]  Pager: [phone] Office: [phone] 09/02/2021, 1:17 PM

## 2021-09-03 ENCOUNTER — Inpatient Hospital Stay (HOSPITAL_COMMUNITY): Payer: Medicare Other

## 2021-09-03 DIAGNOSIS — A419 Sepsis, unspecified organism: Secondary | ICD-10-CM

## 2021-09-03 DIAGNOSIS — M7989 Other specified soft tissue disorders: Secondary | ICD-10-CM

## 2021-09-03 DIAGNOSIS — G3183 Dementia with Lewy bodies: Secondary | ICD-10-CM

## 2021-09-03 DIAGNOSIS — I2699 Other pulmonary embolism without acute cor pulmonale: Secondary | ICD-10-CM | POA: Diagnosis not present

## 2021-09-03 DIAGNOSIS — G9340 Encephalopathy, unspecified: Secondary | ICD-10-CM

## 2021-09-03 DIAGNOSIS — R609 Edema, unspecified: Secondary | ICD-10-CM | POA: Diagnosis not present

## 2021-09-03 DIAGNOSIS — F02A2 Dementia in other diseases classified elsewhere, mild, with psychotic disturbance: Secondary | ICD-10-CM

## 2021-09-03 DIAGNOSIS — R6521 Severe sepsis with septic shock: Secondary | ICD-10-CM | POA: Diagnosis not present

## 2021-09-03 DIAGNOSIS — J9600 Acute respiratory failure, unspecified whether with hypoxia or hypercapnia: Secondary | ICD-10-CM

## 2021-09-03 LAB — APTT
aPTT: 63 seconds — ABNORMAL HIGH (ref 24–36)
aPTT: 80 seconds — ABNORMAL HIGH (ref 24–36)

## 2021-09-03 LAB — CBC
HCT: 35.7 % — ABNORMAL LOW (ref 39.0–52.0)
Hemoglobin: 11.2 g/dL — ABNORMAL LOW (ref 13.0–17.0)
MCH: 27.3 pg (ref 26.0–34.0)
MCHC: 31.4 g/dL (ref 30.0–36.0)
MCV: 87.1 fL (ref 80.0–100.0)
Platelets: 146 10*3/uL — ABNORMAL LOW (ref 150–400)
RBC: 4.1 MIL/uL — ABNORMAL LOW (ref 4.22–5.81)
RDW: 14.6 % (ref 11.5–15.5)
WBC: 13.3 10*3/uL — ABNORMAL HIGH (ref 4.0–10.5)
nRBC: 0 % (ref 0.0–0.2)

## 2021-09-03 LAB — COMPREHENSIVE METABOLIC PANEL
ALT: 709 U/L — ABNORMAL HIGH (ref 0–44)
AST: 213 U/L — ABNORMAL HIGH (ref 15–41)
Albumin: 2.3 g/dL — ABNORMAL LOW (ref 3.5–5.0)
Alkaline Phosphatase: 50 U/L (ref 38–126)
Anion gap: 5 (ref 5–15)
BUN: 26 mg/dL — ABNORMAL HIGH (ref 8–23)
CO2: 24 mmol/L (ref 22–32)
Calcium: 7.7 mg/dL — ABNORMAL LOW (ref 8.9–10.3)
Chloride: 112 mmol/L — ABNORMAL HIGH (ref 98–111)
Creatinine, Ser: 0.9 mg/dL (ref 0.61–1.24)
GFR, Estimated: >60 mL/min (ref >60)
Glucose, Bld: 127 mg/dL — ABNORMAL HIGH (ref 70–99)
Potassium: 3.7 mmol/L (ref 3.5–5.1)
Sodium: 141 mmol/L (ref 135–145)
Total Bilirubin: 0.6 mg/dL (ref 0.3–1.2)
Total Protein: 5 g/dL — ABNORMAL LOW (ref 6.5–8.1)

## 2021-09-03 LAB — PHOSPHORUS: Phosphorus: 2.5 mg/dL (ref 2.5–4.6)

## 2021-09-03 LAB — GLUCOSE, CAPILLARY
Glucose-Capillary: 105 mg/dL — ABNORMAL HIGH (ref 70–99)
Glucose-Capillary: 107 mg/dL — ABNORMAL HIGH (ref 70–99)
Glucose-Capillary: 103 mg/dL — ABNORMAL HIGH (ref 70–99)
Glucose-Capillary: 109 mg/dL — ABNORMAL HIGH (ref 70–99)
Glucose-Capillary: 123 mg/dL — ABNORMAL HIGH (ref 70–99)
Glucose-Capillary: 108 mg/dL — ABNORMAL HIGH (ref 70–99)

## 2021-09-03 LAB — VITAMIN B12: Vitamin B-12: 865 pg/mL (ref 180–914)

## 2021-09-03 LAB — URINE CULTURE
Culture: >=100000 — AB
Special Requests: NORMAL

## 2021-09-03 LAB — HIV ANTIBODY (ROUTINE TESTING W REFLEX): HIV Screen 4th Generation wRfx: NONREACTIVE

## 2021-09-03 LAB — FOLATE: Folate: 18.8 ng/mL (ref >5.9)

## 2021-09-03 LAB — SEDIMENTATION RATE: Sed Rate: 32 mm/hr — ABNORMAL HIGH (ref 0–16)

## 2021-09-03 LAB — HEPARIN LEVEL (UNFRACTIONATED): Heparin Unfractionated: 0.29 IU/mL — ABNORMAL LOW (ref 0.30–0.70)

## 2021-09-03 LAB — CALCIUM, IONIZED: Calcium, Ionized, Serum: 4.5 mg/dL (ref 4.5–5.6)

## 2021-09-03 LAB — TSH: TSH: 0.975 u[IU]/mL (ref 0.350–4.500)

## 2021-09-03 LAB — PROCALCITONIN: Procalcitonin: 0.43 ng/mL

## 2021-09-03 LAB — MAGNESIUM: Magnesium: 2 mg/dL (ref 1.7–2.4)

## 2021-09-03 MED ORDER — TAMSULOSIN HCL 0.4 MG PO CAPS
0.8000 mg | ORAL_CAPSULE | Freq: Every day | ORAL | Status: DC
  Administered 2021-09-04 – 2021-09-08 (×5): 0.8 mg via ORAL
  Filled 2021-09-03 (×5): qty 2

## 2021-09-03 MED ORDER — ORAL CARE MOUTH RINSE: 15.0000 mL | OROMUCOSAL | Status: DC | PRN

## 2021-09-03 MED ORDER — PANTOPRAZOLE 2 MG/ML SUSPENSION: 40.0000 mg | Freq: Every day | ORAL | Status: DC

## 2021-09-03 MED ORDER — IPRATROPIUM-ALBUTEROL 0.5-2.5 (3) MG/3ML IN SOLN: 3.0000 mL | Freq: Four times a day (QID) | RESPIRATORY_TRACT | Status: DC | PRN

## 2021-09-03 MED ORDER — PANTOPRAZOLE SODIUM 40 MG IV SOLR
40.0000 mg | Freq: Once | INTRAVENOUS | Status: AC
  Administered 2021-09-03: 40 mg via INTRAVENOUS
  Filled 2021-09-03: qty 10

## 2021-09-03 MED ORDER — TAMSULOSIN HCL 0.4 MG PO CAPS
0.8000 mg | ORAL_CAPSULE | Freq: Every day | ORAL | Status: DC
  Administered 2021-09-03: 0.8 mg via ORAL
  Filled 2021-09-03: qty 2

## 2021-09-03 MED ORDER — TAMSULOSIN HCL 0.4 MG PO CAPS: 0.8000 mg | ORAL_CAPSULE | Freq: Two times a day (BID) | ORAL | Status: DC

## 2021-09-03 MED ORDER — DOCUSATE SODIUM 100 MG PO CAPS
100.0000 mg | ORAL_CAPSULE | Freq: Two times a day (BID) | ORAL | Status: DC | PRN
  Administered 2021-09-04: 100 mg via ORAL

## 2021-09-03 MED ORDER — POLYETHYLENE GLYCOL 3350 17 G PO PACK
17.0000 g | PACK | Freq: Every day | ORAL | Status: DC | PRN
  Administered 2021-09-04: 17 g via ORAL
  Filled 2021-09-03: qty 1

## 2021-09-03 MED ORDER — VANCOMYCIN HCL 1750 MG/350ML IV SOLN
1750.0000 mg | INTRAVENOUS | Status: AC
  Administered 2021-09-03 – 2021-09-06 (×4): 1750 mg via INTRAVENOUS
  Filled 2021-09-03 (×5): qty 350

## 2021-09-03 MED ORDER — FUROSEMIDE 10 MG/ML IJ SOLN
20.0000 mg | Freq: Once | INTRAMUSCULAR | Status: AC
  Administered 2021-09-03: 20 mg via INTRAVENOUS
  Filled 2021-09-03: qty 2

## 2021-09-03 MED ORDER — ORAL CARE MOUTH RINSE
15.0000 mL | OROMUCOSAL | Status: DC
  Administered 2021-09-03 – 2021-09-05 (×9): 15 mL via OROMUCOSAL

## 2021-09-03 MED ORDER — PANTOPRAZOLE SODIUM 40 MG PO TBEC
40.0000 mg | DELAYED_RELEASE_TABLET | Freq: Every day | ORAL | Status: DC
  Administered 2021-09-04 – 2021-09-08 (×5): 40 mg via ORAL
  Filled 2021-09-03 (×5): qty 1

## 2021-09-03 MED ORDER — RIVAROXABAN 20 MG PO TABS
20.0000 mg | ORAL_TABLET | Freq: Every day | ORAL | Status: DC
  Administered 2021-09-03 – 2021-09-07 (×5): 20 mg via ORAL
  Filled 2021-09-03 (×6): qty 1

## 2021-09-03 NOTE — Evaluation (Addendum)
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 Intubated 8/6-8/8. Pt with septic shock, encephalopathy, respiratory failure and NSTEMI due to demand ischemia. PMhx: fall 08/25/21 with increased hallucinations and bed wetting, HTN, HLD, CVA, Afib, CAD, CABG, HFrEF, GIB, SDH, pulmonary fibrosis  Clinical Impression  Pt pleasant and eager to get OOB. Pt reports having bil AFO at home but with varied reports of use during session. Pt states wife assists with bathing and stairs and is available all the time at home. Pt with decreased strength, function and balance who benefits from RW use with ambulation with request for AFO to be present from home. Pt will benefit from acute therapy to maximize mobility, safety and function to decrease burden of care.   HR 97 SpO2 95% on 1L   91/48 (62)     Recommendations for follow up therapy are one component of a multi-disciplinary discharge planning process, led by the attending physician.  Recommendations may be updated based on patient status, additional functional criteria and insurance authorization.  Follow Up Recommendations Home health PT      Assistance Recommended at Discharge Frequent or constant Supervision/Assistance  Patient can return home with the following  A little help with walking and/or transfers;A little help with bathing/dressing/bathroom;Assistance with cooking/housework;Direct supervision/assist for financial management;Assist for transportation;Direct supervision/assist for medications management;Help with stairs or ramp for entrance    Equipment Recommendations None recommended by PT  Recommendations for Other Services       Functional Status Assessment Patient has had a recent decline in their functional status and demonstrates the ability to make significant  improvements in function in a reasonable and predictable amount of time.     Precautions / Restrictions Precautions Precautions: Fall      Mobility  Bed Mobility Overal bed mobility: Needs Assistance Bed Mobility: Supine to Sit     Supine to sit: Min assist, HOB elevated     General bed mobility comments: HOB 30 degrees with min assist to elevate trunk and pivot to EOB    Transfers Overall transfer level: Needs assistance   Transfers: Sit to/from Stand Sit to Stand: Min assist           General transfer comment: cues for hand placement with assist to rise from surface, cues for hand placement to lower to surface    Ambulation/Gait Ambulation/Gait assistance: Min assist Gait Distance (Feet): 26 Feet Assistive device: Rolling walker (2 wheels) Gait Pattern/deviations: Step-through pattern, Decreased stride length, Decreased dorsiflexion - left, Decreased dorsiflexion - right, Trunk flexed   Gait velocity interpretation: <1.8 ft/sec, indicate of risk for recurrent falls   General Gait Details: pt with left foot drop with reliance on RW, min assist to control and direct RW with cues for stepping into RW and posture  Stairs            Wheelchair Mobility    Modified Rankin (Stroke Patients Only)       Balance Overall balance assessment: Needs assistance, History of Falls   Sitting balance-Leahy Scale: Fair Sitting balance - Comments: EOb without UE support   Standing balance support: Bilateral upper extremity supported, Reliant on assistive device for balance Standing balance-Leahy Scale: Poor Standing balance comment: bil UE support on RW  Pertinent Vitals/Pain Pain Assessment Pain Assessment: No/denies pain    Home Living Family/patient expects to be discharged to:: Private residence Living Arrangements: Spouse/significant other Available Help at Discharge: Family;Available 24 hours/day Type of Home:  House Home Access: Stairs to enter Entrance Stairs-Rails: Left Entrance Stairs-Number of Steps: 3   Home Layout: One level;Able to live on main level with bedroom/bathroom;Two level Home Equipment: Cane - single Librarian, academic (2 wheels)      Prior Function Prior Level of Function : Needs assist       Physical Assist : Mobility (physical);ADLs (physical) Mobility (physical): Gait ADLs (physical): Bathing;Dressing Mobility Comments: pt uses cane but pt states wife wants him to use RW, falls, assist for stairs ADLs Comments: wife does the IADLs and assists with bathing, was attending [employer]     Hand Dominance        Extremity/Trunk Assessment   Upper Extremity Assessment Upper Extremity Assessment: Generalized weakness    Lower Extremity Assessment Lower Extremity Assessment: LLE deficits/detail LLE Deficits / Details: foot drop with weakness from prior CVA    Cervical / Trunk Assessment Cervical / Trunk Assessment: Normal  Communication   Communication: No difficulties  Cognition Arousal/Alertness: Awake/alert Behavior During Therapy: WFL for tasks assessed/performed Overall Cognitive Status: Impaired/Different from baseline Area of Impairment: Orientation, Safety/judgement, Problem solving                 Orientation Level: Disoriented to, Time       Safety/Judgement: Decreased awareness of safety, Decreased awareness of deficits   Problem Solving: Slow processing          General Comments      Exercises     Assessment/Plan    PT Assessment Patient needs continued PT services  PT Problem List Decreased strength;Decreased mobility;Decreased safety awareness;Decreased activity tolerance;Decreased balance;Decreased knowledge of use of DME       PT Treatment Interventions Gait training;Balance training;Stair training;Functional mobility training;Therapeutic activities;Patient/family education;Cognitive remediation;DME instruction;Therapeutic  exercise    PT Goals (Current goals can be found in the Care Plan section)  Acute Rehab PT Goals Patient Stated Goal: return home PT Goal Formulation: With patient Time For Goal Achievement: 09/17/21 Potential to Achieve Goals: Fair    Frequency Min 3X/week     Co-evaluation               AM-PAC PT "6 Clicks" Mobility  Outcome Measure Help needed turning from your back to your side while in a flat bed without using bedrails?: A Little Help needed moving from lying on your back to sitting on the side of a flat bed without using bedrails?: A Little Help needed moving to and from a bed to a chair (including a wheelchair)?: A Little Help needed standing up from a chair using your arms (e.g., wheelchair or bedside chair)?: A Little Help needed to walk in hospital room?: A Little Help needed climbing 3-5 steps with a railing? : A Lot 6 Click Score: 17    End of Session Equipment Utilized During Treatment: Gait belt;Oxygen Activity Tolerance: Patient tolerated treatment well Patient left: in chair;with call bell/phone within reach;with chair alarm set Nurse Communication: Mobility status PT Visit Diagnosis: Other abnormalities of gait and mobility (R26.89);Difficulty in walking, not elsewhere classified (R26.2);Muscle weakness (generalized) (M62.81);History of falling (Z91.81)    Time: 5997-7414 PT Time Calculation (min) (ACUTE ONLY): 28 min   Charges:   PT Evaluation $PT Eval Moderate Complexity: 1 Mod PT Treatments $Gait Training: 8-22 mins  Merryl Hacker, PT Acute Rehabilitation Services Office: [phone]   Enedina Finner Fread Kottke 09/03/2021, 2:02 PM

## 2021-09-03 NOTE — Procedures (Signed)
Extubation Procedure Note  Patient Details:   Name: Calvin Brown DOB: April 16, 1933 MRN: [id number]   Airway Documentation:    Vent end date: 09/03/21 Vent end time: 0915   Evaluation  O2 sats: stable throughout Complications: No apparent complications Patient did tolerate procedure well. Bilateral Breath Sounds: Clear, Diminished   Yes  Patient extubated to 2L  at this time. Patient had a positive cuff leak prior to extubation. Patient able to speak and no stridor noted at this time.   Tyronne Blann A Salayah Meares 09/03/2021, 9:25 AM

## 2021-09-03 NOTE — Evaluation (Signed)
Clinical/Bedside Swallow Evaluation Patient Details  Name: Calvin Brown MRN: [id number] Date of Birth: 04/19/1933  Today's Date: 09/03/2021 Time: SLP Start Time (ACUTE ONLY): 1345 SLP Stop Time (ACUTE ONLY): 1410 SLP Time Calculation (min) (ACUTE ONLY): 25 min  Past Medical History:  Past Medical History:  Diagnosis Date   Atrial fibrillation (HCC)    CAD (coronary artery disease)    CVA (cerebral vascular accident) (HCC) 05/27/2013   GI bleed 05/27/2013   History of blood transfusion    GI Bleed   History of kidney stones    surgery to remove   HTN (hypertension)    Hx of CABG    Possibly 2015?   Hyperlipidemia    Pneumonia    Past Surgical History:  Past Surgical History:  Procedure Laterality Date   CORONARY ARTERY BYPASS GRAFT  1992   CYSTOSCOPY/URETEROSCOPY/HOLMIUM LASER/STENT PLACEMENT Left 07/02/2021   Procedure: LEFT URETEROSCOPY/HOLMIUM LASER retrograde pylegram left stent placement;  Surgeon: Despina Arias, MD;  Location: [facility];  Service: Urology;  Laterality: Left;   ESOPHAGOGASTRODUODENOSCOPY     IR ANGIO EXTERNAL CAROTID SEL EXT CAROTID UNI L MOD SED  05/03/2021   IR ANGIO INTRA EXTRACRAN SEL INTERNAL CAROTID UNI L MOD SED  05/03/2021   IR ANGIOGRAM FOLLOW UP STUDY  05/03/2021   IR NEURO EACH ADD'L AFTER BASIC UNI LEFT (MS)  05/03/2021   IR TRANSCATH/EMBOLIZ  05/03/2021   RADIOLOGY WITH ANESTHESIA N/A 05/03/2021   Procedure: IR WITH ANESTHESIA;  Surgeon: Lisbeth Renshaw, MD;  Location: [facility] OR;  Service: Radiology;  Laterality: N/A;   TONSILLECTOMY AND ADENOIDECTOMY     HPI:  There are moderate to large infiltrates in both lower lobes, more so  on the right side suggesting atelectasis/pneumonia. There are small  patchy ground-glass and reticulonodular infiltrates in both upper  lobes, more so on the right side suggesting possible multifocal  pneumonia. Small bilateral pleural effusions.    Assessment / Plan / Recommendation  Clinical Impression  Patient presents with  what appears to be a primary cognitive-based dysphagia that is likely with impact from 3 day intubation and h/o CVA. Patient was sitting up in recliner when SLP arrived and was awake and alert. Mouth was mildly dry but no significant amount of dried secretions observed. Voice was strong but mildly hoarse. Patient was unable to feed himself with cup or with spoon as he would either spill liquids and puree solids on himself or not bring spoon or cup up to mouth adequately. He exhibited suspected delayed swallow initiation with liquids and puree solids but only one instance of mild, slightly delayed throat clear after sip of water.  No oral holding observed, but initially patient became perseverative on spitting water out after performing oral care, requiring SLP to redirect by switching briefly to ice chips. SLP is recommending Dys 1 solids (puree) thin liquids with full supervision and assistance. SLP also recommending (brought to room later in day), 5 cc Provale cup for managing liquid sip amount. SLP showed patient and his spouse about this cup and left the instructions on use. SLP will f/u next date for potential trial of upgraded solids. SLP Visit Diagnosis: Dysphagia, unspecified (R13.10)    Aspiration Risk  Mild aspiration risk    Diet Recommendation Thin liquid;Dysphagia 1 (Puree)   Liquid Administration via: Cup;Other (Comment) (5 cc provale cup) Medication Administration: Whole meds with puree Supervision: Full supervision/cueing for compensatory strategies;Staff to assist with self feeding Compensations: Minimize environmental distractions;Slow rate;Small sips/bites Postural Changes: Seated  upright at 90 degrees    Other  Recommendations Oral Care Recommendations: Oral care BID;Staff/trained caregiver to provide oral care    Recommendations for follow up therapy are one component of a multi-disciplinary discharge planning process, led by the attending physician.  Recommendations may be  updated based on patient status, additional functional criteria and insurance authorization.  Follow up Recommendations Follow physician's recommendations for discharge plan and follow up therapies      Assistance Recommended at Discharge Frequent or constant Supervision/Assistance  Functional Status Assessment Patient has had a recent decline in their functional status and demonstrates the ability to make significant improvements in function in a reasonable and predictable amount of time.  Frequency and Duration min 2x/week  2 weeks       Prognosis Prognosis for Safe Diet Advancement: Good      Swallow Study   General Date of Onset: 09/01/21 HPI: There are moderate to large infiltrates in both lower lobes, more so  on the right side suggesting atelectasis/pneumonia. There are small  patchy ground-glass and reticulonodular infiltrates in both upper  lobes, more so on the right side suggesting possible multifocal  pneumonia. Small bilateral pleural effusions. Type of Study: Bedside Swallow Evaluation Previous Swallow Assessment: during previous admission March of 2023 Diet Prior to this Study: NPO Temperature Spikes Noted: No Respiratory Status: Nasal cannula History of Recent Intubation: Yes Length of Intubations (days): 3 days Date extubated: 09/03/21 Behavior/Cognition: Cooperative;Alert;Pleasant mood;Confused;Impulsive Oral Cavity Assessment: Dry Oral Care Completed by SLP: Yes Oral Cavity - Dentition: Adequate natural dentition;Dentures, top Vision: Functional for self-feeding Self-Feeding Abilities: Needs assist;Needs set up;Total assist Patient Positioning: Upright in chair Baseline Vocal Quality: Hoarse;Normal Volitional Cough: Cognitively unable to elicit Volitional Swallow: Able to elicit    Oral/Motor/Sensory Function Overall Oral Motor/Sensory Function: Mild impairment Facial ROM: Reduced right Facial Symmetry: Abnormal symmetry right Facial Strength: Reduced  right Lingual Symmetry: Within Functional Limits Lingual Strength: Reduced   Ice Chips     Thin Liquid Thin Liquid: Impaired Presentation: Straw;Cup Oral Phase Impairments: Poor awareness of bolus;Reduced labial seal Oral Phase Functional Implications: Right anterior spillage;Left anterior spillage Pharyngeal  Phase Impairments: Suspected delayed Swallow;Throat Clearing - Delayed    Nectar Thick     Honey Thick     Puree Puree: Impaired Oral Phase Impairments: Poor awareness of bolus Pharyngeal Phase Impairments: Suspected delayed Swallow   Solid     Solid: Not tested     Angela Nevin, MA, CCC-SLP Speech Therapy

## 2021-09-03 NOTE — Progress Notes (Signed)
Progress Note  Patient Name: Calvin Brown Date of Encounter: 09/03/2021  Attending physician: Oretha MilchAlva, Rakesh V, MD Primary care provider: Charlane FerrettiSkakle, [place], DO Primary Cardiologist: Tessa LernerSunit Drema Eddington, DO, [facility]  Subjective: Calvin Flavorslton Treese is a 88 y.o. male who was seen and examined at bedside  Status postextubation. Troponins trended down yesterday Nodding no to chest pain. Case discussed and reviewed with his nurse and CCM team  Objective: Vital Signs in the last 24 hours: Temp:  [97.2 F (36.2 C)-99.6 F (37.6 C)] 98.8 F (37.1 C) (08/08 0731) Pulse Rate:  [69-103] 101 (08/08 0930) Resp:  [15-27] 21 (08/08 0930) BP: (97-125)/(52-81) 116/54 (08/08 0748) SpO2:  [87 %-100 %] 87 % (08/08 0930) Arterial Line BP: (99-144)/(42-68) 102/42 (08/08 0930) FiO2 (%):  [30 %-40 %] 30 % (08/08 0800) Weight:  [84.4 kg-85.8 kg] 85.8 kg (08/08 0413)  Intake/Output:  Intake/Output Summary (Last 24 hours) at 09/03/2021 1041 Last data filed at 09/03/2021 0900 Smick per 24 hour  Intake 2590.32 ml  Output 625 ml  Net 1965.32 ml    Net IO Since Admission: 7,150.73 mL [09/03/21 1041]  Weights:  Filed Weights   09/02/21 0241 09/02/21 1300 09/03/21 0413  Weight: 82.3 kg 84.4 kg 85.8 kg    Telemetry: Personally reviewed.  No significant dysrhythmia  Physical examination: PHYSICAL EXAM: Temp:  [97.2 F (36.2 C)-99.6 F (37.6 C)] 98.8 F (37.1 C) (08/08 0731) Pulse Rate:  [69-103] 101 (08/08 0930) Cardiac Rhythm: Atrial fibrillation (08/08 0800) Resp:  [15-27] 21 (08/08 0930) BP: (97-125)/(52-81) 116/54 (08/08 0748) SpO2:  [87 %-100 %] 87 % (08/08 0930) Arterial Line BP: (99-144)/(42-68) 102/42 (08/08 0930) FiO2 (%):  [30 %-40 %] 30 % (08/08 0800) Weight:  [84.4 kg-85.8 kg] 85.8 kg (08/08 0413)  Today's Vitals   09/03/21 0845 09/03/21 0900 09/03/21 0915 09/03/21 0930  BP:      Pulse: 79 86 96 (!) 101  Resp: 20 (!) 21 (!) 27 (!) 21  Temp:      TempSrc:      SpO2: 100% 99% 99% (!) 87%  Weight:        Body mass index is 26.38 kg/m.  CONSTITUTIONAL: Frail, appears older than stated age, temporal wasting  SKIN: Skin is warm and dry. No rash noted. No cyanosis. No pallor. No jaundice HEAD: Normocephalic and atraumatic.  EYES: No scleral icterus MOUTH/THROAT: Dry oral membranes.  NECK: No JVD present. No thyromegaly noted.  Right carotid bruits  CHEST Normal respiratory effort. No intercostal retractions  LUNGS: Decreased breath sounds bilaterally, equal rise and fall of chest cavity. CARDIOVASCULAR: Irregularly irregular, variable S1-S2, no murmurs rubs or gallops appreciated.   ABDOMINAL: Soft, nontender, nondistended, positive bowel sounds in all 4 quadrants, no apparent ascites.  EXTREMITIES: No pitting edema, warm to touch, ecchymoses noted in bilateral extremities HEMATOLOGIC: No significant bruising NEUROLOGIC: Currently nods his head to simple questions, follows commands, grossly nonfocal.    Lab Results: Hematology Recent Labs  Lab 09/01/21 1030 09/01/21 1041 09/01/21 2300 09/02/21 0412 09/03/21 0421  WBC 10.4  --   --  11.6* 13.3*  RBC 4.62  --   --  4.09* 4.10*  HGB 12.8*   < > 11.9* 11.4* 11.2*  HCT 43.1   < > 35.0* 35.3* 35.7*  MCV 93.3  --   --  86.3 87.1  MCH 27.7  --   --  27.9 27.3  MCHC 29.7*  --   --  32.3 31.4  RDW 13.6  --   --  14.0 14.6  PLT 190  --   --  167 146*   < > = values in this interval not displayed.    Chemistry Recent Labs  Lab 08/29/21 1334 08/29/21 1813 09/01/21 1030 09/01/21 1041 09/01/21 2200 09/01/21 2300 09/02/21 0412 09/03/21 0421  NA 140   < > 140   < > 140 142 140 141  K 4.2   < > 5.6*   < > 4.0 4.1 3.9 3.7  CL 96*  --  99   < > 109  --  111 112*  CO2 38*  --  32  --  24  --  23 24  GLUCOSE 83  --  132*   < > 106*  --  107* 127*  BUN 23  --  40*   < > 36*  --  33* 26*  CREATININE 0.87  --  1.81*   < > 1.35*  --  1.25* 0.90  CALCIUM 9.4  --  8.6*  --  7.4*  --  7.6* 7.7*  PROT 6.5  --  6.1*  --   --   --   --  5.0*   ALBUMIN 3.5  --  3.2*  --   --   --   --  2.3*  AST 19  --  795*  --   --   --   --  213*  ALT 26  --  928*  --   --   --   --  709*  ALKPHOS 58  --  66  --   --   --   --  50  BILITOT 0.6  --  0.8  --   --   --   --  0.6  GFRNONAA >60  --  36*  --  51*  --  56* >60  ANIONGAP 6  --  9  --  7  --  6 5   < > = values in this interval not displayed.     Cardiac Enzymes: Cardiac Panel (last 3 results) Recent Labs    09/01/21 1722 09/01/21 2047 09/02/21 1154  TROPONINIHS 2,111* 3,384* 2,161*    BNP (last 3 results) Recent Labs    01/29/21 0932 04/15/21 1639  BNP 162.7* 224.3*    ProBNP (last 3 results) Recent Labs    02/04/21 0939 02/18/21 0920 05/22/21 0916  PROBNP 1,077* 600* 570*     DDimer No results for input(s): "DDIMER" in the last 168 hours.   Hemoglobin A1c: No results found for: "HGBA1C", "MPG"  TSH No results for input(s): "TSH" in the last 8760 hours.  Lipid Panel     Component Value Date/Time   CHOL 96 (L) 12/31/2020 0919   TRIG 24 09/01/2021 1722   HDL 41 12/31/2020 0919   LDLCALC 38 12/31/2020 0919   LDLDIRECT 38 12/31/2020 0922    Imaging: CT Angio Chest Pulmonary Embolism (PE) W or WO Contrast  Result Date: 09/02/2021 CLINICAL DATA:  Clinical suspicion of pulmonary embolism EXAM: CT ANGIOGRAPHY CHEST WITH CONTRAST TECHNIQUE: Multidetector CT imaging of the chest was performed using the standard protocol during bolus administration of intravenous contrast. Multiplanar CT image reconstructions and MIPs were obtained to evaluate the vascular anatomy. RADIATION DOSE REDUCTION: This exam was performed according to the departmental dose-optimization program which includes automated exposure control, adjustment of the mA and/or kV according to patient size and/or use of iterative reconstruction technique. CONTRAST:  15mL OMNIPAQUE IOHEXOL 350 MG/ML SOLN COMPARISON:  CT done on 05/28/2020 and chest radiograph done on 09/01/2021 FINDINGS: Cardiovascular:  Contrast density in thoracic aorta is less than adequate to evaluate the lumen. There is ectasia of the ascending thoracic aorta measuring 4.2 cm. Left side of the aortic arch measures 3.9 cm. There are no intraluminal filling defects seen pulmonary artery branches. In image 70 of series 6, there is possible small linear low-density in a segmental branch in the right lower lobe. Evaluation of small subsegmental branches in the lower lobes is limited by infiltrates. RV- LV ratio is less than 1. coronary artery calcifications are seen. There is previous coronary bypass surgery. Mediastinum/Nodes: No significant lymphadenopathy is seen. Tip of endotracheal tube is at the level of aortic arch. Lungs/Pleura: Moderate to large infiltrates are seen in both lower lobes, more so on the right side with air bronchograms suggesting atelectasis/pneumonia. Small scattered ground-glass and nodular infiltrates are seen in right upper lobe. Small patchy ground-glass infiltrate is seen in left upper lobe and left parahilar region. Small bilateral pleural effusions are seen. There is no pneumothorax. Upper Abdomen: Distal portion of NG tube is seen in the stomach. There is 3 mm calcific density in the upper pole of left kidney. Minimal ascites is present. Musculoskeletal: There is slight decrease in height of few of thoracic vertebral bodies, particularly in T3 and T5 vertebrae. Alignment of posterior margin of vertebral bodies is unremarkable. Review of the MIP images confirms the above findings. IMPRESSION: There is no evidence of central pulmonary artery embolism. There is small linear low-density in his segmental branch and right lower lobe, possibly suggesting PE with very small thrombus burden. If clinically warranted, please consider venous Doppler examination of both lower extremities. There are no signs of acute right ventricular strain. There are moderate to large infiltrates in both lower lobes, more so on the right side  suggesting atelectasis/pneumonia. There are small patchy ground-glass and reticulonodular infiltrates in both upper lobes, more so on the right side suggesting possible multifocal pneumonia. Small bilateral pleural effusions. Severe coronary artery disease. There is ectasia of the ascending thoracic aorta measuring 4.2 cm. There is ectasia of left side of aortic arch measuring 3.9 cm. Recommend annual imaging followup by CTA or MRA. This recommendation follows 2010 ACCF/AHA/AATS/ACR/ASA/SCA/SCAI/SIR/STS/SVM Guidelines for the Diagnosis and Management of Patients with Thoracic Aortic Disease. Circulation.2010; 121: G387-F643. Aortic aneurysm NOS (ICD10-I71.9) There is mild decrease in height of the bodies of T3 and T5 vertebrae which may suggest recent or old compression fractures. Possible 3 mm left renal calculus. Minimal ascites. Electronically Signed   By: Ernie Avena M.D.   On: 09/02/2021 15:40   ECHOCARDIOGRAM COMPLETE  Result Date: 09/02/2021    ECHOCARDIOGRAM REPORT   Patient Name:   Izrael Peak Date of Exam: 09/02/2021 Medical Rec #:  [id number]   Height:       71.0 in Accession #:    [id number]  Weight:       181.4 lb Date of Birth:  1934-01-28   BSA:          2.023 m Patient Age:    88 years    BP:           83/54 mmHg Patient Gender: M           HR:           98 bpm. Exam Location:  Inpatient Procedure: 2D Echo, Cardiac Doppler, Color Doppler and Intracardiac            Opacification Agent  Indications:    Acute ischemic heart disease, unspecified I24.9  History:        Patient has prior history of Echocardiogram examinations, most                 recent 01/29/2021. CAD, Prior CABG, Stroke, Arrythmias:Atrial                 Fibrillation; Risk Factors:Hypertension and Dyslipidemia.  Sonographer:    Milda Smart Referring Phys: Jovita Kussmaul, Judie Petit  Sonographer Comments: Echo performed with patient supine and on artificial respirator. Image acquisition challenging due to respiratory motion. ICU  patient. IMPRESSIONS  1. Left ventricular ejection fraction, by estimation, is 45 to 50%. The left ventricle has mildly decreased function. The left ventricle demonstrates global hypokinesis. There is mild left ventricular hypertrophy of the basal-septal segment. Left ventricular diastolic parameters are indeterminate. There is the interventricular septum is flattened in systole and diastole, consistent with right ventricular pressure and volume overload.  2. Right ventricular systolic function is severely reduced. The right ventricular size is mildly enlarged.  3. Left atrial size was moderately dilated.  4. Right atrial size was moderately dilated.  5. The mitral valve is normal in structure. Mild mitral valve regurgitation. No evidence of mitral stenosis.  6. Tricuspid valve regurgitation is mild to moderate.  7. The aortic valve is tricuspid. Aortic valve regurgitation is mild to moderate. No aortic stenosis is present.  8. Aortic dilatation noted. There is mild dilatation of the ascending aorta, measuring 40 mm.  9. The inferior vena cava is normal in size with greater than 50% respiratory variability, suggesting right atrial pressure of 3 mmHg. Comparison(s): Prior images reviewed side by side. FINDINGS  Left Ventricle: Left ventricular ejection fraction, by estimation, is 45 to 50%. The left ventricle has mildly decreased function. The left ventricle demonstrates global hypokinesis. Definity contrast agent was given IV to delineate the left ventricular  endocardial borders. The left ventricular internal cavity size was normal in size. There is mild left ventricular hypertrophy of the basal-septal segment. The interventricular septum is flattened in systole and diastole, consistent with right ventricular pressure and volume overload. Left ventricular diastolic parameters are indeterminate. Right Ventricle: The right ventricular size is mildly enlarged. No increase in right ventricular wall thickness. Right  ventricular systolic function is severely reduced. Left Atrium: Left atrial size was moderately dilated. Right Atrium: Right atrial size was moderately dilated. Pericardium: There is no evidence of pericardial effusion. Mitral Valve: The mitral valve is normal in structure. Mild mitral valve regurgitation. No evidence of mitral valve stenosis. Tricuspid Valve: The tricuspid valve is normal in structure. Tricuspid valve regurgitation is mild to moderate. No evidence of tricuspid stenosis. Aortic Valve: The aortic valve is tricuspid. Aortic valve regurgitation is mild to moderate. No aortic stenosis is present. Pulmonic Valve: The pulmonic valve was normal in structure. Pulmonic valve regurgitation is mild. No evidence of pulmonic stenosis. Aorta: Aortic dilatation noted. There is mild dilatation of the ascending aorta, measuring 40 mm. Venous: The inferior vena cava is normal in size with greater than 50% respiratory variability, suggesting right atrial pressure of 3 mmHg. IAS/Shunts: No atrial level shunt detected by color flow Doppler.  LEFT VENTRICLE PLAX 2D LVIDd:         4.40 cm   Diastology LVIDs:         3.70 cm   LV e' medial:    9.37 cm/s LV PW:         0.90 cm  LV E/e' medial:  4.4 LV IVS:        1.40 cm   LV e' lateral:   16.90 cm/s LVOT diam:     2.30 cm   LV E/e' lateral: 2.4 LV SV:         49 LV SV Index:   24 LVOT Area:     4.15 cm  RIGHT VENTRICLE RV S prime:     11.10 cm/s TAPSE (M-mode): 0.5 cm LEFT ATRIUM             Index        RIGHT ATRIUM           Index LA diam:        4.90 cm 2.42 cm/m   RA Area:     22.30 cm LA Vol (A2C):   62.6 ml 30.94 ml/m  RA Volume:   60.00 ml  29.66 ml/m LA Vol (A4C):   90.2 ml 44.59 ml/m LA Biplane Vol: 75.1 ml 37.12 ml/m  AORTIC VALVE             PULMONIC VALVE LVOT Vmax:   75.00 cm/s  PR End Diast Vel: 9.49 msec LVOT Vmean:  53.100 cm/s LVOT VTI:    0.117 m  AORTA Ao Root diam: 3.10 cm Ao Asc diam:  4.00 cm MITRAL VALVE               TRICUSPID VALVE MV Area  (PHT): 5.20 cm    TR Peak grad:   21.3 mmHg MV Decel Time: 146 msec    TR Vmax:        231.00 cm/s MV E velocity: 41.40 cm/s                            SHUNTS                            Systemic VTI:  0.12 m                            Systemic Diam: 2.30 cm Donato Schultz MD Electronically signed by Donato Schultz MD Signature Date/Time: 09/02/2021/11:26:59 AM    Final    EEG adult  Result Date: 09/01/2021 Rejeana Brock, MD     09/01/2021  5:21 PM History: 88 yo M being evaluated for acute encephalopathy Sedation: fentanyl Technique: This EEG was acquired with electrodes placed according to the International 10-20 electrode system (including Fp1, Fp2, F3, F4, C3, C4, P3, P4, O1, O2, T3, T4, T5, T6, A1, A2, Fz, Cz, Pz). The following electrodes were missing or displaced: none. Background: The background consists of intermixed irregular delta and theta range activities. There are occiasional generalized discharges with triphasic morphology without evidence of evolution or other concerning features. No definite epileptiform discharges were seen. Posterior dominant rhythm was not visualized. Photic stimulation: Physiologic driving is not performed. EEG Abnormalities: 1) Triphasic waves 2) Generalized irregular slow activity 3) Absent PDR Clinical Interpretation: This EEG is consistent with a generalized non-specific cerebral dysfunction(encephalopathy). There was no seizure or seizure predisposition recorded on this study. Please note that lack of epileptiform activity on EEG does not preclude the possibility of epilepsy. Ritta Slot, MD [facility] [phone] If 7pm- 7am, please page neurology on call as listed in AMION.   DG Chest Port 1 View  Result Date: 09/01/2021 CLINICAL DATA:  Central line placement. EXAM: PORTABLE CHEST 1 VIEW COMPARISON:  09/01/2021 at 11:33 a.m. FINDINGS: New right internal jugular central venous line has its tip projecting in the upper aspect of the right atrium.  Endotracheal tube and nasal/orogastric tube are stable. Elevated right hemidiaphragm. Mild lung base opacities consistent with atelectasis. Remainder of the lungs is clear. No pneumothorax. IMPRESSION: 1. Right internal jugular central venous line catheter tip projects near the caval atrial junction, likely in the superior aspect of the right atrium. 2. No pneumothorax. 3. No other change from the earlier study. Electronically Signed   By: Amie Portland M.D.   On: 09/01/2021 16:16   CT ABDOMEN PELVIS WO CONTRAST  Result Date: 09/01/2021 CLINICAL DATA:  Hepatitis, unwitnessed fall EXAM: CT ABDOMEN AND PELVIS WITHOUT CONTRAST TECHNIQUE: Multidetector CT imaging of the abdomen and pelvis was performed following the standard protocol without IV contrast. RADIATION DOSE REDUCTION: This exam was performed according to the departmental dose-optimization program which includes automated exposure control, adjustment of the mA and/or kV according to patient size and/or use of iterative reconstruction technique. COMPARISON:  06/05/2021 FINDINGS: Lower chest: Small bilateral pleural effusions and associated atelectasis or consolidation. Cardiomegaly. Three-vessel coronary artery calcifications Hepatobiliary: No solid liver abnormality is seen. No gallstones, gallbladder wall thickening, or biliary dilatation. Pancreas: Diffuse peripancreatic fat stranding and fluid. No evident pancreatic ductal dilatation or acute pancreatic fluid collection. Spleen: Normal in size without significant abnormality. Adrenals/Urinary Tract: Adrenal glands are unremarkable. Small nonobstructive left renal calculi. Mildly patulous appearance of the left renal pelvis, unchanged compared to prior. Simple, benign right renal cyst, for which no further follow-up or characterization is required. Bilateral perinephric fat stranding, increased compared to prior examination. Foley catheter in the bladder. Stomach/Bowel: Esophagogastric tube with tip and  side port below the diaphragm, tip in the gastric fundus. Stomach is within normal limits. Appendix is not clearly visualized. No evidence of bowel wall thickening, distention, or inflammatory changes. Vascular/Lymphatic: Aortic atherosclerosis. No enlarged abdominal or pelvic lymph nodes. Reproductive: No mass or other significant abnormality. Other: No abdominal wall hernia or abnormality. Small volume perihepatic and perisplenic ascites. Musculoskeletal: No acute or significant osseous findings. IMPRESSION: 1. Diffuse peripancreatic fat stranding and fluid, consistent with acute pancreatitis. No evident pancreatic ductal dilatation or acute pancreatic fluid collection on noncontrast examination. 2. Small volume ascites. 3. Small bilateral pleural effusions and associated atelectasis or consolidation. 4. Small nonobstructive left renal calculi. Mildly patulous appearance of the left renal pelvis, unchanged compared to prior examination. No ureteral calculi or overt hydronephrosis. 5. Bilateral perinephric fat stranding, increased compared to prior examination possibly reactive to adjacent pancreatitis. Correlate with urinalysis to exclude urinary tract infection. 6. Coronary artery disease. Aortic Atherosclerosis (ICD10-I70.0). Electronically Signed   By: Jearld Lesch M.D.   On: 09/01/2021 13:16   CT Head Wo Contrast  Result Date: 09/01/2021 CLINICAL DATA:  Head trauma.  Unwitnessed fall.  On Xarelto. EXAM: CT HEAD WITHOUT CONTRAST TECHNIQUE: Contiguous axial images were obtained from the base of the skull through the vertex without intravenous contrast. RADIATION DOSE REDUCTION: This exam was performed according to the departmental dose-optimization program which includes automated exposure control, adjustment of the mA and/or kV according to patient size and/or use of iterative reconstruction technique. COMPARISON:  Head CT 08/29/2021 FINDINGS: Brain: There is no evidence of an acute infarct, acute  intracranial hemorrhage, mass, or midline shift. At most trace residual low-density subdural fluid remains over the left frontal convexity. There is a moderate-sized chronic right MCA infarct  involving the anterior insula and frontal operculum with ex vacuo dilatation of the frontal horn of the right lateral ventricle. Hypodensities elsewhere in the cerebral white matter bilaterally are unchanged and nonspecific but compatible with mild chronic small vessel ischemic disease. Vascular: Calcified atherosclerosis at the skull base. Prior left middle meningeal artery embolization. Skull: No fracture or suspicious osseous lesion. Sinuses/Orbits: Mild mucosal thickening in the ethmoid sinuses. Clear mastoid air cells. Bilateral cataract extraction. Other: None. IMPRESSION: 1. No evidence of acute intracranial abnormality. 2. Chronic findings as above. Electronically Signed   By: Sebastian Ache M.D.   On: 09/01/2021 13:14   DG Chest Portable 1 View  Result Date: 09/01/2021 CLINICAL DATA:  Status post intubation. EXAM: PORTABLE CHEST 1 VIEW COMPARISON:  08/29/2021 FINDINGS: 1133 hours. Endotracheal tube tip is 5.6 cm above the base of the carina. NG tube is looped in the stomach with tip overlying the gastric cardia. No edema or pneumothorax. No substantial right pleural effusion. There is some basilar atelectasis bilaterally with possible tiny left effusion. Telemetry leads overlie the chest. IMPRESSION: 1. Endotracheal tube tip 5.6 cm above the carina. 2. NG tube tip is in the stomach. Electronically Signed   By: Kennith Center M.D.   On: 09/01/2021 12:02    RADIOLOGY: April 23, 2021: CT of the head without contrast: No acute intracranial findings are seen in noncontrast CT brain. Old infarct is seen in the right frontal lobe in the right MCA distribution. Atrophy.  There is large subcutaneous hematoma in the right posterior parietal scalp. No fracture is seen in the calvarium.   April 21, 2021: CT of the head  without contrast Interval development of an epidural fluid collection overlying the  left cerebral hemisphere, likely reflecting subacute blood products. No internal brain herniation.   April 22, 2021: CT of the head without contrast 1. Unchanged low to intermediate density subdural collection along the left convexity, measuring up to 7 mm in thickness and probably a hematoma. No midline shift. 2. Right posterior scalp hematoma.   May 03, 2021: IR transcatheter/embolization 1. Successful onyx embolization of the left middle meningeal artery for chronic subdural hematoma   CARDIAC DATABASE: EKG: 05/31/2021: Atrial fibrillation, 84 bpm, without underlying injury pattern.    09/01/2021:  1047: Atrial fibrillation, 76 bpm, without underlying injury pattern. 19:52 atrial fibr injuryillation, 86 bpm, T wave inversions in the anterior precordial leads suggestive of ischemia, no ST elevation.  Compared to prior ECG T wave inversions are new.   Echocardiogram: 04/16/2020: LVEF 55-60%, inferior basal hypokinesis, mildly dilated LV cavity, mild LVH, RV size and function normal, severely dilated left atrium, mild MR, moderate TR, mild AI, mild to moderate aortic valve sclerosis without stenosis, ascending aorta 39 mm.   01/29/2021: LVEF 55%, severely dilated left atrium, moderate AI, mild MR, moderate TR, RVSP 50 mmHg, mild to moderate pulmonic regurgitation.   09/02/2021: LVEF 45-50%, mildly reduced LVEF, global hypokinesis, basal septal hypertrophy, IVS flattened in both systole and diastole consistent with RV pressure and volume overload, right ventricular systolic function severely reduced in size mildly dilated, LA moderately dilated, RA moderately dilated, mild to moderate TR, mild to moderate AR, proximal ascending aorta 40 mm, RAP 3 mmHg.   Stress Testing: Last MPI Jun April 2022 with Dr. Hyacinth Meeker in [place],: Per report: Perfusion study is abnormal, fixed perfusion defect seen on imaging,  mild reversibility. Poor exercise tolerance. No exercise-induced arrhythmia. Normal exercise blood pressure response. No exercise-induced ECG changes. Anterolateral infarct. Mild anterolateral  hypokinesis LVEF calculated 55%. Mild anterolateral hypokinesis.  Fixed inferior lateral perfusion defect with some reversibility peri-infarct.  Mildly abnormal scan   Heart Catheterization: 5 to 6 years ago, per patient we will request records.   Carotid artery duplex 05/16/2021: Duplex suggests stenosis in the right internal carotid artery (16-49%). Duplex suggests stenosis in the right external carotid artery (<50%). Duplex suggests stenosis in the left internal carotid artery (50-69%). Duplex suggests stenosis in the left external carotid artery (<50%). Antegrade right vertebral artery flow. Antegrade left vertebral artery flow. Significant change since 10/23/2020. Follow up in six months is appropriate if clinically indicated.  Scheduled Meds:  arformoterol  15 mcg Nebulization BID   Chlorhexidine Gluconate Cloth  6 each Topical Daily   furosemide  20 mg Intravenous Once   ipratropium-albuterol  3 mL Nebulization Q6H   pantoprazole (PROTONIX) IV  40 mg Intravenous Once   [START ON 09/04/2021] pantoprazole sodium  40 mg Per Tube Daily   sodium chloride flush  10-40 mL Intracatheter Q12H    Continuous Infusions:  heparin 1,000 Units/hr (09/03/21 0940)   norepinephrine (LEVOPHED) Adult infusion 2 mcg/min (09/03/21 0900)   vancomycin      PRN Meds: docusate, polyethylene glycol, sodium chloride flush   IMPRESSION & RECOMMENDATIONS: Yoandri Congrove is a 88 y.o. Caucasian male whose past medical history and cardiac risk factors include: Hypertension, hyperlipidemia, CVA in 2015, persistent atrial fibrillation, CAD status post CABG in 1992 (LIMA to the LAD and SVG to OM) at age of 75, HFrEF, history of GI bleed secondary to AVM in 2015, scalp hematoma (03/2021), subdural hematoma s/p left middle  meningeal artery embolization (April 2023), aortic atherosclerosis, former smoker, idiopathic pulmonary fibrosis, advanced age.  Impression: NSTEMI Septic shock. Acute encephalopathy. Biventricular heart failure. Coronary artery disease with prior CABG without angina pectoris. Idiopathic pulmonary fibrosis. Persistent atrial fibrillation. Long-term oral anticoagulation. History of GI bleed secondary to AVM in 2015. History of subdural hematoma status post left middle meningeal artery embolization (April 2023)   Recommendations: NSTEMI: Cardiology was consulted during this hospitalization given his NSTEMI (high sensitive troponins peaked at 3384 and TWI on EKG).  According to the patient/family he did not have any anginal discomfort prior to the weeks leading to this hospitalization.  Given his comorbidities of CAD/CABG and heart failure I suspected that his current NSTEMI is likely due to supply demand ischemia as he presents with septic shock, acute encephalopathy, acute hypercarbic respiratory failure.  However, progression of obstructive CAD cannot be entirely ruled out given mild reduction in LVEF and known hx of CAD and prior CABG.  After long discussion with the patient's family including wife and kids the shared decision was to hold off on invasive angiography as he did not have chest pain prior to arrival, in the past had refused to undergo heart catheterization to evaluate for obstructive CAD, and his troponins could be explained by other etiologies such as septic shock, acute encephalopathy, acute hypercarbic respiratory failure.  Shared decision was to trend troponin.  His troponins did start a downtrend.  This morning labs also illustrate resolution of acute kidney injury and improvement in LFTs.  He was extubated few minutes prior to morning rounds and denies any anginal discomfort or chest pain.  Would like to monitor him for now.  Recommend IV heparin drip for total of 48  hours as long as remains low risk for bleeding.  Continue your care given his other chronic comorbid conditions and problem list.  We will follow  the patient with you and provide more definitive recommendations given his clinical trajectory today and after discussing plan of care with family.  During morning rounds discuss his care and plan w/ patient, his daughter, RN, and intensivist.   Patient's questions and concerns were addressed to his satisfaction. He voices understanding of the instructions provided during this encounter.   This note was created using a voice recognition software as a result there may be grammatical errors inadvertently enclosed that do not reflect the nature of this encounter. Every attempt is made to correct such errors.  Total time spent: 35 minutes.   Delilah Shan [facility]  Pager: [phone] Office: [phone] 09/03/2021, 10:41 AM

## 2021-09-03 NOTE — Progress Notes (Signed)
NAME:  Calvin Brown, MRN:  [id number], DOB:  11/05/1933, LOS: 2 ADMISSION DATE:  09/01/2021, CONSULTATION DATE:  09/01/21 REFERRING MD:  Adela Lank, CHIEF COMPLAINT:  AMS   History of Present Illness:  88 year old with PMH of CAD s/p CABG, IPF recently started on pirfenidone, atrial fibrillation on xarelto who is a retired Investment banker, operational BIBEMS after being found unresponsive by wife this morning, LKN 8/5 evening.  Initially hypotensive with EMS, given fluids, placed on nonrebreather .  Intubated for airway protection due to low GCS, hypothermic, started on Levophed and broad-spectrum antibiotics Labs show WBC count of 10, lactic 1.5, mild hyperkalemia, new renal failure BUN/creatinine 40/1.8, elevated LFTs VBG was 7.2 6/82/130 Head CT showed previous subdural hematoma and almost resolved, moderate chronic right MCA infarct CT abdomen showed stranding around the pancreas consistent with acute pancreatitis with small volume ascites and small effusions , no hydronephrosis.   He was started on pirfenidone about a month ago.  Recent UTI 2 weeks ago, started on Macrodantin by urology .  He had a fall at home on 7/30, complained of left shoulder pain, x-ray negative per orthopedics but since then his mental status has been declining with hallucinations, bedwetting and increasing weakness.  ED visit on 8/3 with negative head CT , VBG was 7.43/57 , no leukocytosis, normal renal function. Ambien was started 8/3.  Pertinent  Medical History  CAD s/p CABG,  IPF on pirfenidone, follows with Dr. Everardo All Chronic bronchitis, remote smoker, on Breo 07/08/21 FVC 2.51 (65%) FEV1 1.97 (73%) Ratio 73  TLC 74% DLCO 54% Interpretation: Moderate restrictive defect with moderately reduced DLCO HTN,  CVA,  atrial fibrillation Xarelto  Subdural hematoma 03/2021 requiring embolization of meningeal artery    Significant Hospital Events: Including procedures, antibiotic start and stop dates in addition to other pertinent  events   7/30 fall  8/3 ED visit 8/6 admitted for unresponsiveness, CVC placed 8/7 troponins elevated with t wave inversion, cards consulted type 2 NSTEMI 8/8 extubated  Interim History / Subjective:  Off of fentanyl, precedex drip on, tolerating SBT  Objective   Blood pressure 121/62, pulse 78, temperature 98.7 F (37.1 C), temperature source Oral, resp. rate 15, weight 85.8 kg, SpO2 100 %.    Vent Mode: PRVC FiO2 (%):  [30 %-50 %] 30 % Set Rate:  [15 bmp] 15 bmp Vt Set:  [600 mL] 600 mL PEEP:  [5 cmH20] 5 cmH20 Plateau Pressure:  [17 cmH20-19 cmH20] 18 cmH20   Intake/Output Summary (Last 24 hours) at 09/03/2021 1093 Last data filed at 09/03/2021 0000 [place] per 24 hour  Intake 2149.6 ml  Output 670 ml  Net 1479.6 ml    Filed Weights   09/02/21 0241 09/02/21 1300 09/03/21 0413  Weight: 82.3 kg 84.4 kg 85.8 kg    Examination: General:  elderly man, Intubated, minimally sedated HENT: NCAT ETT in place PULM: CTAB, vent supported breathing CV: RRR, no mgr GI: BS+, soft, nontender MSK: normal bulk and tone, trace edema to lower extremities Neuro: sedated on vent   Creatinine 0.9 K 3.7 BUN 26 WBC trending up from 11.6 to 13.3 Hgb 11.2 stable Pro-cal 0.42 to 0.43 stable Blood culture ngtd Urine culture staph epidermidis > 100,000  Resolved Hospital Problem list   AKI  Assessment & Plan:   Acute metabolic encephalopathy-multifactorial Presented with 2 week history of worsening mentation. Likely multifactorial in setting of medications leading to hypercarbia. Urine culture did grow >100,000 staph epi, with his recent urology procedure in early July  will treat as true infection. Mentation improved this AM. Family concerned for parkinson's disease with his recent history of fluctuating mentation and hallucinations. No cogwheel rigidity on exam. CT head showed chronic right MCA infarct. EEG negative following admission. -Neuro consulted -start vancomycin  NSTEMI Prior  history of CAD s/p CABG 1992 (LIMA to LAD and SVG to OM). Nuclear stress test in April 2022 showed inferior and lateral fixed defects with possibility of peri-infarct ischemia at that time he refused invasive angiography. Echo 2022 with EF 55%. Cardiology consulted and think that troponin elevation likely from type 2 NSTEMI. -heparin gtt for 48 hours (8/9) -Dr. Odis Hollingshead consulted  Shock- sedation vs sepsis WBC initially low at 10.2 but increasing to 13 this AM. Urine culture grew >100,000 s epi and with recent urology procedures in July will treat as true infection. He also had signs on CTA chest concerning for aspiration pneumonia. He has remained on low dose of levophed which is likely multifactorial with use of precedex -levophed with MAP goal > 65 -precedex dc -trend CBC  Acute respiratory failure Chronic hypercarbic respiratory failure Underlying IPF , PFTs have not shown significant airway obstruction Extubated 8/8  Elevated LFTs-improving CT showed signs concerning for pancreatitis, however lipase was wnl.  -trend CMP   Best Practice (right click and "Reselect all SmartList Selections" daily)   Diet/type: NPO DVT prophylaxis: systemic heparin GI prophylaxis: PPI Lines: Central line Foley:  Yes, and it is still needed Code Status:  full code Last date of multidisciplinary goals of care discussion [8/8]  Nyhla Mountjoy M. Yarel Kilcrease, D.O.  Internal Medicine Resident, PGY-2 [facility]  Pager: [phone] 6:33 AM, 09/03/2021

## 2021-09-03 NOTE — Progress Notes (Signed)
Lower extremity venous has been completed.   Preliminary results in CV Proc.   Calvin Brown 09/03/2021 11:04 AM

## 2021-09-03 NOTE — Progress Notes (Signed)
Parkinsons disease,  Retired Psychologist, sport and exercise,  Dementia  IPF- Pirfenidone   Neurology Consultation  Reason for Consult: Parkinson's disease Referring Physician: Elsworth Soho  CC: Altered mental status  History is obtained from: Patient, chart review  HPI: Calvin Brown is a 88 y.o. male  Hypertension, hyperlipidemia, CVA in 2015, persistent atrial fibrillation, CAD status post CABG in 1992 (LIMA to the LAD and SVG to OM) at age of 42, HFpEF, history of GI bleed secondary to AVM in 2015, scalp hematoma (03/2021), subdural hematoma s/p left middle meningeal artery embolization (April 2023), aortic atherosclerosis, former smoker, idiopathic pulmonary fibrosis.  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 During this hospitalization he had a few episodes of hypotension in which his wife stated that he does have episodes of orthostatic hypotension at home.  He was started on pirfenidone a month ago, had a UTI 2 weeks ago, and then fell at home on 7/30. He has been having hallucinations, incontinence, and increased weakness since then. He was started on Ambien on 8/3. Head CT shows no acute abnormality. Patient states that he does have a tremor at times. He has to wear an AFO on his left leg (since stroke) and his left leg is weaker than his right. He is not currently having any hallucinations but does endorse them recently.   ROS: Full ROS was performed and is negative except as noted in the HPI.    Past Medical History:  Diagnosis Date   Atrial fibrillation ([place])    CAD (coronary artery disease)    CVA (cerebral vascular accident) ([place]) 05/27/2013   GI bleed 05/27/2013   History of blood transfusion    GI Bleed   History of kidney stones    surgery to remove   HTN (hypertension)    Hx of CABG    Possibly 2015?   Hyperlipidemia    Pneumonia     Family History  Problem Relation Age  of Onset   Vision loss Mother    Breast cancer Mother 32   Hypertension Father    Heart disease Sister    Heart attack Sister 49    Social History:   reports that he quit smoking about 54 years ago. His smoking use included cigarettes. He started smoking about 66 years ago. He has a 24.00 pack-year smoking history. He has never used smokeless tobacco. He reports that he does not currently use alcohol. He reports that he does not use drugs.  Medications  Current Facility-Administered Medications:    arformoterol (BROVANA) nebulizer solution 15 mcg, 15 mcg, Nebulization, BID, Eubanks, Katalina M, NP, 15 mcg at 09/03/21 0748   Chlorhexidine Gluconate Cloth 2 % PADS 6 each, 6 each, Topical, Daily, Rigoberto Noel, MD, 6 each at 09/03/21 0845   docusate (COLACE) 50 MG/5ML liquid 100 mg, 100 mg, Per Tube, BID PRN, Rigoberto Noel, MD   heparin ADULT infusion 100 units/mL (25000 units/246mL), 1,000 Units/hr, Intravenous, Continuous, Kara Mead V, MD, Last Rate: 10 mL/hr at 09/03/21 0940, 1,000 Units/hr at 09/03/21 0940   ipratropium-albuterol (DUONEB) 0.5-2.5 (3) MG/3ML nebulizer solution 3 mL, 3 mL, Nebulization, Q6H PRN, Rigoberto Noel, MD   norepinephrine (LEVOPHED) 4mg  in 266mL (0.016 mg/mL) premix infusion, 0-40 mcg/min, Intravenous, Titrated, Eubanks, Katalina M, NP, Last Rate: 7.5 mL/hr at 09/03/21 0900, 2 mcg/min at 09/03/21 0900   [START ON 09/04/2021] pantoprazole sodium (PROTONIX) 40  mg/20 mL oral suspension 40 mg, 40 mg, Per Tube, Daily, Cozart, Hannah, RPH   polyethylene glycol (MIRALAX / GLYCOLAX) packet 17 g, 17 g, Per Tube, Daily PRN, Dewaine Oats, Danie Chandler, NP   sodium chloride flush (NS) 0.9 % injection 10-40 mL, 10-40 mL, Intracatheter, Q12H, Kara Mead V, MD, 10 mL at 09/03/21 0924   sodium chloride flush (NS) 0.9 % injection 10-40 mL, 10-40 mL, Intracatheter, PRN, Rigoberto Noel, MD   tamsulosin (FLOMAX) capsule 0.8 mg, 0.8 mg, Oral, Daily, Masters, Katie, DO   vancomycin  (VANCOREADY) IVPB 1750 mg/350 mL, 1,750 mg, Intravenous, Q24H, Rigoberto Noel, MD  Exam: Current vital signs: BP (!) 116/54 Comment: aline reading  Pulse (!) 101   Temp 98.2 F (36.8 C) (Oral)   Resp (!) 21   Wt 85.8 kg   SpO2 (!) 87%   BMI 26.38 kg/m  Vital signs in last 24 hours: Temp:  [97.5 F (36.4 C)-99.6 F (37.6 C)] 98.2 F (36.8 C) (08/08 1126) Pulse Rate:  [69-103] 101 (08/08 0930) Resp:  [15-27] 21 (08/08 0930) BP: (97-125)/(52-81) 116/54 (08/08 0748) SpO2:  [87 %-100 %] 87 % (08/08 0930) Arterial Line BP: (99-144)/(42-68) 102/42 (08/08 0930) FiO2 (%):  [30 %-40 %] 30 % (08/08 0800) Weight:  [84.4 kg-85.8 kg] 85.8 kg (08/08 0413)  GENERAL: Awake, alert, ill appearing HEENT: - Normocephalic and atraumatic, dry mm, no LN++, no Thyromegally LUNGS - Clear to auscultation bilaterally with no wheezes, tachypnea at rest CV - S1S2 irregular rate and rhythm, no m/r/g, equal pulses bilaterally. ABDOMEN - Soft, nontender, nondistended with normoactive BS Ext: warm, well perfused, intact peripheral pulses, 1+ edema in extremities  NEURO:  Mental Status: Awake and alert in bed, he is oriented to self, place, and month. He does initially state the incorrect year and he is slightly disoriented to his situation. Language: speech is hoarse.  Naming, repetition, fluency, and comprehension intact. Cranial Nerves: PERRL, EOMI, visual fields full, ,left facial droop, facial sensation intact, hearing intact, tongue/uvula/soft palate midline, normal sternocleidomastoid and trapezius muscle strength. No evidence of tongue atrophy or fibrillations Motor: Generalized weakness in all extremities worse in left lower extremity. Moves all extremities antigravity.  RUE 5/5 LUE 5/5 RLE 4/5 LLE 4-/5 Weak plantar flexion and dorsiflexion bilaterally.  Tone: is normal and bulk is decreased Sensation- Intact to light touch bilaterally Coordination: FTN intact bilaterally, no ataxia in BLE. Tremor  noted with movement, however this is intermittent and seems to slow down with rest.  Gait- assist x1 with walker. He is stooped over, needs prompting to stand up straight with the walker. Foot drop noted on left foot.    Labs I have reviewed labs in epic and the results pertinent to this consultation are:  CBC    Component Value Date/Time   WBC 13.3 (H) 09/03/2021 0421   RBC 4.10 (L) 09/03/2021 0421   HGB 11.2 (L) 09/03/2021 0421   HGB 12.9 (L) 02/04/2021 0939   HCT 35.7 (L) 09/03/2021 0421   HCT 38.1 02/04/2021 0939   PLT 146 (L) 09/03/2021 0421   MCV 87.1 09/03/2021 0421   MCH 27.3 09/03/2021 0421   MCHC 31.4 09/03/2021 0421   RDW 14.6 09/03/2021 0421   LYMPHSABS 0.8 08/29/2021 1334   MONOABS 1.1 (H) 08/29/2021 1334   EOSABS 0.0 08/29/2021 1334   BASOSABS 0.1 08/29/2021 1334    CMP     Component Value Date/Time   NA 141 09/03/2021 0421   NA 140 05/22/2021 0915  K 3.7 09/03/2021 0421   CL 112 (H) 09/03/2021 0421   CO2 24 09/03/2021 0421   GLUCOSE 127 (H) 09/03/2021 0421   BUN 26 (H) 09/03/2021 0421   BUN 16 05/22/2021 0915   CREATININE 0.90 09/03/2021 0421   CALCIUM 7.7 (L) 09/03/2021 0421   PROT 5.0 (L) 09/03/2021 0421   PROT 6.7 12/31/2020 0920   ALBUMIN 2.3 (L) 09/03/2021 0421   ALBUMIN 4.1 12/31/2020 0920   AST 213 (H) 09/03/2021 0421   ALT 709 (H) 09/03/2021 0421   ALKPHOS 50 09/03/2021 0421   BILITOT 0.6 09/03/2021 0421   BILITOT 0.4 12/31/2020 0920   GFRNONAA >60 09/03/2021 0421    Lipid Panel     Component Value Date/Time   CHOL 96 (L) 12/31/2020 0919   TRIG 24 09/01/2021 1722   HDL 41 12/31/2020 0919   LDLCALC 38 12/31/2020 0919   LDLDIRECT 38 12/31/2020 0922   ESR- 32 TSH 0.975 B12- 865 Folate- 18.8   Imaging I have reviewed the images obtained:  CT-head- resolving SDH, right MCA infarct, and small vessel disease  Assessment:  88 y.o. male  Hypertension, hyperlipidemia, CVA in 2015, persistent atrial fibrillation, CAD status post  CABG in 1992 (LIMA to the LAD and SVG to OM) at age of 80, HFpEF, history of GI bleed secondary to AVM in 2015, scalp hematoma (03/2021), subdural hematoma s/p left middle meningeal artery embolization (April 2023), aortic atherosclerosis, former smoker, idiopathic pulmonary fibrosis. Full neurological exam is clouded by acute illness. Tremor worsened throughout exam, but he was able to ambulate with physical therapy using a walker during exam. He did state that he typically uses AFOs which would be helpful to have in the hospital during his therapy sessions. He denies hallucinations at this time, but did mention occasionally seeing bugs and believing that he is at the beach during the hospital stay.   Recommendations: - Dementia panel ordered including B12, B1, folate, TSH, RPR, and ESR  - Follow outpatient for dementia/parkinson's work up   -- Patient seen and examined by NP/APP with MD. MD to update note as needed.   Janine Ores, DNP, FNP-BC [facility] Pager: [phone]

## 2021-09-03 NOTE — Progress Notes (Signed)
Pharmacy Antibiotic Note  Calvin Brown is a 88 y.o. male admitted on 09/01/2021 with pneumonia.  Pharmacy has been consulted for Vancomycin dosing.  Using today's Scr of 0.9, a Vancomycin maintenance dose of 1750mg  IV q24 hours is expected to produce an AUC of 501 once at steady state.    Plan: Vancomycin 1750 IV every 24 hours.  Goal AUC 400-550.  Will plan to measure a level once at steady state prior to the 4th dose  Will monitor renal fx, s/sx infection, and culture results and sensitivities   Weight: 85.8 kg (189 lb 2.5 oz)  Temp (24hrs), Avg:98.4 F (36.9 C), Min:97.2 F (36.2 C), Max:99.6 F (37.6 C)  Recent Labs  Lab 08/29/21 1334 09/01/21 1030 09/01/21 1041 09/01/21 1212 09/01/21 1644 09/01/21 2047 09/01/21 2200 09/02/21 0412 09/03/21 0421  WBC 10.3 10.4  --   --   --   --   --  11.6* 13.3*  CREATININE 0.87 1.81* 1.90*  --   --   --  1.35* 1.25* 0.90  LATICACIDVEN  --  1.5  --  2.0* 1.8 1.3  --   --   --     Estimated Creatinine Clearance: 62.8 mL/min (by C-G formula based on SCr of 0.9 mg/dL).    No Known Allergies  Antimicrobials this admission: Zosyn  8/6 >> 8/8  Dose adjustments this admission: N/A  Microbiology results: 8/6 BCx: NG x 2 days  8/6 UCx: >100,000 colonies staph epi   8/8 Sputum: few gram + cocci in pairs  8/6 MRSA PCR: Not detected   Thank you for allowing pharmacy to be a part of this patient's care.  10/6, PharmD  PGY1 Pharmacy Resident

## 2021-09-03 NOTE — Consult Note (Signed)
Parkinsons disease,  Retired Psychologist, sport and exercise,  Dementia  IPF- Pirfenidone   Neurology Consultation  Reason for Consult: Possible incipient Parkinson's disease versus LBD.   Referring Physician: Dr. Elsworth Soho  CC: Altered mental status  History is obtained from: Patient, chart review  HPI: Calvin Brown is a 88 y.o. male  Hypertension, hyperlipidemia, CVA in 2015, persistent atrial fibrillation, CAD status post CABG in 1992 (LIMA to the LAD and SVG to OM) at age of 55, HFpEF, history of GI bleed secondary to AVM in 2015, scalp hematoma (03/2021), subdural hematoma s/p left middle meningeal artery embolization (April 2023), aortic atherosclerosis, former smoker, idiopathic pulmonary fibrosis.   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 During this hospitalization he had a few episodes of hypotension in which his wife stated that he does have episodes of orthostatic hypotension at home.   He was started on pirfenidone a month ago, had a UTI 2 weeks ago, and then fell at home on 7/30. He has been having hallucinations, incontinence, and increased weakness since then. He was started on Ambien on 8/3 for persistent insomnia refractory to other medications.   Head CT this admission shows no acute abnormality. Patient states that he does have a tremor at times. He has to wear an AFO on his left leg (since stroke) and his left leg is weaker than his right. He is not currently having any hallucinations but does endorse them recently. The hallucinations are formed, of people or animals. Wife states he has had abnormal movements during sleep at times, as though he is acting out his dreams. His gait has progressively become more stooped over the past 1.5 years and he also has decreased facial expressivity over time, per his wife. His gait is not shuffling, in the context of bilateral foot drop that  requires him to lift his legs higher than normal in order to ambulate.   ROS: Full ROS was performed and is negative except as noted in the HPI.    Past Medical History:  Diagnosis Date   Atrial fibrillation ([place])    CAD (coronary artery disease)    CVA (cerebral vascular accident) ([place]) 05/27/2013   GI bleed 05/27/2013   History of blood transfusion    GI Bleed   History of kidney stones    surgery to remove   HTN (hypertension)    Hx of CABG    Possibly 2015?   Hyperlipidemia    Pneumonia     Family History  Problem Relation Age of Onset   Vision loss Mother    Breast cancer Mother 65   Hypertension Father    Heart disease Sister    Heart attack Sister 15    Social History:   reports that he quit smoking about 54 years ago. His smoking use included cigarettes. He started smoking about 66 years ago. He has a 24.00 pack-year smoking history. He has never used smokeless tobacco. He reports that he does not currently use alcohol. He reports that he does not use drugs.  Medications  Current Facility-Administered Medications:    arformoterol (BROVANA) nebulizer solution 15 mcg, 15 mcg, Nebulization, BID, Eubanks, Katalina M, NP, 15 mcg at 09/03/21 1949   Chlorhexidine Gluconate Cloth 2 % PADS 6 each, 6 each, Topical, Daily, Rigoberto Noel, MD, 6 each at 09/03/21 0845   docusate sodium (COLACE) capsule 100 mg, 100 mg, Oral,  BID PRN, Rigoberto Noel, MD   ipratropium-albuterol (DUONEB) 0.5-2.5 (3) MG/3ML nebulizer solution 3 mL, 3 mL, Nebulization, Q6H PRN, Rigoberto Noel, MD   norepinephrine (LEVOPHED) 15m in 2544m(0.016 mg/mL) premix infusion, 0-40 mcg/min, Intravenous, Titrated, EuOmar PersonNP, Last Rate: 18.75 mL/hr at 09/03/21 1800, 5 mcg/min at 09/03/21 1800   Oral care mouth rinse, 15 mL, Mouth Rinse, 4 times per day, AlRigoberto NoelMD   Oral care mouth rinse, 15 mL, Mouth Rinse, PRN, AlRigoberto NoelMD   [START ON 09/04/2021] pantoprazole (PROTONIX) EC tablet 40  mg, 40 mg, Oral, Daily, Cozart, Hannah, RPH   polyethylene glycol (MIRALAX / GLYCOLAX) packet 17 g, 17 g, Oral, Daily PRN, AlRigoberto NoelMD   rivaroxaban (XARELTO) tablet 20 mg, 20 mg, Oral, Q supper, AlKara Mead, MD, 20 mg at 09/03/21 1627   sodium chloride flush (NS) 0.9 % injection 10-40 mL, 10-40 mL, Intracatheter, Q12H, AlKara Mead, MD, 10 mL at 09/03/21 0924   sodium chloride flush (NS) 0.9 % injection 10-40 mL, 10-40 mL, Intracatheter, PRN, AlRigoberto NoelMD   tamsulosin (FLOMAX) capsule 0.8 mg, 0.8 mg, Oral, Daily, Masters, Katie, DO   vancomycin (VANCOREADY) IVPB 1750 mg/350 mL, 1,750 mg, Intravenous, Q24H, AlRigoberto NoelMD, Stopped at 09/03/21 1454  Exam: Current vital signs: BP (!) 97/54   Pulse 85   Temp 97.8 F (36.6 C) (Oral)   Resp (!) 22   Wt 85.8 kg   SpO2 96%   BMI 26.38 kg/m  Vital signs in last 24 hours: Temp:  [97.8 F (36.6 C)-99 F (37.2 C)] 97.8 F (36.6 C) (08/08 1947) Pulse Rate:  [69-103] 85 (08/08 2000) Resp:  [15-32] 22 (08/08 2000) BP: (91-121)/(48-62) 97/54 (08/08 2000) SpO2:  [87 %-100 %] 96 % (08/08 2000) Arterial Line BP: (91-145)/(41-68) 118/46 (08/08 2000) FiO2 (%):  [30 %] 30 % (08/08 0800) Weight:  [85.8 kg] 85.8 kg (08/08 0413)  GENERAL: Awake, alert, ill appearing HEENT: - Normocephalic and atraumatic, dry mm, no LN++, no Thyromegally LUNGS - Clear to auscultation bilaterally with no wheezes, tachypnea at rest CV - S1S2 irregular rate and rhythm, no m/r/g, equal pulses bilaterally. ABDOMEN - Soft, nontender, nondistended with normoactive BS Ext: warm, well perfused, intact peripheral pulses, 1+ edema in extremities  NEURO:  Mental Status: Awake and alert in bed, he is oriented to self, place, and month. He does initially state the incorrect year and he is slightly disoriented to his situation. Language: speech is hypophonic with a component of hoarseness.  Naming, repetition, fluency, and comprehension intact. Cranial  Nerves: PERRL, EOMI, visual fields full, mild left facial droop, facial sensation intact, hearing intact, tongue/uvula/soft palate midline, normal sternocleidomastoid and trapezius muscle strength. No evidence of tongue atrophy or fasciculations Motor: Generalized weakness in all extremities worse in left lower extremity. Moves all extremities antigravity.  RUE 5/5 LUE 5/5 RLE 4/5 LLE 4-/5 Weak plantar flexion and dorsiflexion bilaterally.  There is mild to moderate cogwheel rigidity of LUE and mild cogwheel rigidity of RUE Sensation- Intact to light touch bilaterally Coordination: FTN intact bilaterally, no ataxia in BLE. Low amplitude, high frequency tremor noted with movement, however this is intermittent and seems to slow down with rest. No pill-rolling tremor noted. Gait- assist x1 with walker. He is stooped over, needs prompting to stand up straight with the walker. Foot drop noted on left foot.    Labs I have reviewed labs in epic and the results  pertinent to this consultation are:  CBC    Component Value Date/Time   WBC 13.3 (H) 09/03/2021 0421   RBC 4.10 (L) 09/03/2021 0421   HGB 11.2 (L) 09/03/2021 0421   HGB 12.9 (L) 02/04/2021 0939   HCT 35.7 (L) 09/03/2021 0421   HCT 38.1 02/04/2021 0939   PLT 146 (L) 09/03/2021 0421   MCV 87.1 09/03/2021 0421   MCH 27.3 09/03/2021 0421   MCHC 31.4 09/03/2021 0421   RDW 14.6 09/03/2021 0421   LYMPHSABS 0.8 08/29/2021 1334   MONOABS 1.1 (H) 08/29/2021 1334   EOSABS 0.0 08/29/2021 1334   BASOSABS 0.1 08/29/2021 1334    CMP     Component Value Date/Time   NA 141 09/03/2021 0421   NA 140 05/22/2021 0915   K 3.7 09/03/2021 0421   CL 112 (H) 09/03/2021 0421   CO2 24 09/03/2021 0421   GLUCOSE 127 (H) 09/03/2021 0421   BUN 26 (H) 09/03/2021 0421   BUN 16 05/22/2021 0915   CREATININE 0.90 09/03/2021 0421   CALCIUM 7.7 (L) 09/03/2021 0421   PROT 5.0 (L) 09/03/2021 0421   PROT 6.7 12/31/2020 0920   ALBUMIN 2.3 (L) 09/03/2021 0421    ALBUMIN 4.1 12/31/2020 0920   AST 213 (H) 09/03/2021 0421   ALT 709 (H) 09/03/2021 0421   ALKPHOS 50 09/03/2021 0421   BILITOT 0.6 09/03/2021 0421   BILITOT 0.4 12/31/2020 0920   GFRNONAA >60 09/03/2021 0421    Lipid Panel     Component Value Date/Time   CHOL 96 (L) 12/31/2020 0919   TRIG 24 09/01/2021 1722   HDL 41 12/31/2020 0919   LDLCALC 38 12/31/2020 0919   LDLDIRECT 38 12/31/2020 0922   ESR- 32 TSH 0.975 B12- 865 Folate- 18.8   Imaging I have reviewed the images obtained:  CT-head- resolving SDH, right MCA infarct and small vessel disease  Assessment: 88 y.o. male with a PMHx of hypertension, hyperlipidemia, CVA in 2015, persistent atrial fibrillation, CAD status post CABG in 1992 (LIMA to the LAD and SVG to OM) at age of 60, HFpEF, history of GI bleed secondary to AVM in 2015, scalp hematoma (03/2021), subdural hematoma s/p left middle meningeal artery embolization (April 2023), aortic atherosclerosis, former smoker, idiopathic pulmonary fibrosis who was admitted to the ICU on 8/6 after being found unresponsive by his wife that morning after a two week period of fluctuating mental status. He was hypotensive on presentation with a low GCS and at high risk for aspiration therefore he was intubated. He was diagnosed with septic shock and treated empirically with Zosyn/vancomycin. Family requested Neurology consult to assess for possible Parkinson's disease given symptoms of tremor and shuffling gait with AMS at home.  - Head CT this admission shows no acute abnormality.  - Full neurological exam is clouded by acute illness. Tremor worsened throughout exam, but he was able to ambulate with physical therapy using a walker during exam.  - He denies hallucinations at this time, but did mention occasionally seeing bugs and believing that he was at the beach at one point during the hospital stay. At home he has had hallucinations of animals and people.  - Patient states that he does  have a tremor at times. Wife states he has had abnormal movements during sleep at times, as though he is acting out his dreams. His gait has progressively become more stooped over the past 1.5 years and he also has decreased facial expressivity over time, per his wife; these features  were noted on exam today. Cogwheeling was noted on exam as well. His gait is not shuffling; however, this is in the context of bilateral foot drop that requires him to lift his legs higher than normal in order to ambulate.  - Overall symptoms and exam findings are suggestive of possible Lewy body dementia or Parkinson's disease with dementia.   Recommendations: - Dementia panel ordered including B12, B1, folate, TSH, RPR, and ESR  - Follow outpatient for Lewy body dementia/Parkinson's work up. I have recommended to patient and family that he be evaluated at the [facility] movement disorders clinic.    -- Patient seen and examined by NP/APP with MD.  Janine Ores, DNP, FNP-BC [facility] Pager: [phone]  I have seen and examined the patient. I have formulated the assessment and recommendations. 89 year old male presenting with sepsis. Patient's wife requested neurology consult to evaluate for possible Parkinsonism. He endorses formed visual hallucinations and wife has noted that he appears to be acting out his dreams during sleep at times. His gait has progressively become more stooped over the past 1.5 years and he also has decreased facial expressivity over time, per his wife; these features were noted on exam today. Cogwheeling was noted on exam as well. Recommendations as above.  Electronically signed: Dr. Kerney Elbe

## 2021-09-03 NOTE — Progress Notes (Signed)
ANTICOAGULATION CONSULT NOTE  Pharmacy Consult for heparin Indication: chest pain/ACS  No Known Allergies  Patient Measurements: Weight: 85.8 kg (189 lb 2.5 oz) Ht 5'11" Wt 75.9kg Heparin Dosing Weight: 76 kg  Vital Signs: Temp: 98.8 F (37.1 C) (08/08 0731) Temp Source: Oral (08/08 0731) BP: 116/54 (08/08 0748) Pulse Rate: 93 (08/08 0815)  Labs: Recent Labs    09/01/21 1030 09/01/21 1041 09/01/21 1722 09/01/21 2047 09/01/21 2200 09/01/21 2300 09/01/21 2300 09/02/21 0412 09/02/21 0455 09/02/21 1154 09/02/21 1444 09/02/21 2149 09/03/21 0421 09/03/21 0423  HGB 12.8*   < >  --   --   --  11.9*  --  11.4*  --   --   --   --  11.2*  --   HCT 43.1   < >  --   --   --  35.0*  --  35.3*  --   --   --   --  35.7*  --   PLT 190  --   --   --   --   --   --  167  --   --   --   --  146*  --   APTT  --   --   --   --   --   --    < > 113* 131*  --  87* 80* 63*  --   LABPROT 20.9*  --   --   --   --   --   --   --   --   --   --   --   --   --   INR 1.8*  --   --   --   --   --   --   --   --   --   --   --   --   --   HEPARINUNFRC  --   --   --   --   --   --   --  >1.10* >1.10*  --   --   --   --  0.29*  CREATININE 1.81*   < >  --   --  1.35*  --   --  1.25*  --   --   --   --  0.90  --   TROPONINIHS  --    < > 2,111* 3,384*  --   --   --   --   --  2,161*  --   --   --   --    < > = values in this interval not displayed.     Estimated Creatinine Clearance: 62.8 mL/min (by C-G formula based on SCr of 0.9 mg/dL).   Medical History: Past Medical History:  Diagnosis Date   Atrial fibrillation ([facility])    CAD (coronary artery disease)    CVA (cerebral vascular accident) ([facility]) 05/27/2013   GI bleed 05/27/2013   History of blood transfusion    GI Bleed   History of kidney stones    surgery to remove   HTN (hypertension)    Hx of CABG    Possibly 2015?   Hyperlipidemia    Pneumonia      Assessment: 88 yo M admitted for septic shock, acute encephalopathy and acute  pancreatitis with rising troponins up to 2111. PMH significant for chronic SDH, chronic CVA. Patient was on rivaroxaban PTA, LD 8/5 @ 1930. INR elevated due to rivaroxaban. Pharmacy consulted for heparin.  AKI will prolong rivaroxaban clearance. Following aPTT levels until correlation with heparin level.  aPTT 63 sec (subtherapeutic) on infusion at 950 units/hr earlier today, so will incr rate.   No issues with bleeding or with infusion per RN  Hgb 11.4; plt 167  Goal of Therapy:  Heparin level 0.3-0.7 units/ml Monitor platelets by anticoagulation protocol: Yes   Plan:  Increase heparin at 1000 units/hr  APTT in 8 hours F/u AM aPTT and HL  Jeanella Cara, PharmD, [facility] Clinical Pharmacist Please see AMION for all Pharmacists' Contact Phone Numbers 09/03/2021, 9:08 AM

## 2021-09-04 DIAGNOSIS — A419 Sepsis, unspecified organism: Secondary | ICD-10-CM | POA: Diagnosis not present

## 2021-09-04 DIAGNOSIS — G9340 Encephalopathy, unspecified: Secondary | ICD-10-CM | POA: Diagnosis not present

## 2021-09-04 DIAGNOSIS — R6521 Severe sepsis with septic shock: Secondary | ICD-10-CM | POA: Diagnosis not present

## 2021-09-04 LAB — POCT I-STAT 7, (LYTES, BLD GAS, ICA,H+H)
Acid-base deficit: 2 mmol/L (ref 0.0–2.0)
Bicarbonate: 24.2 mmol/L (ref 20.0–28.0)
Calcium, Ion: 1.22 mmol/L (ref 1.15–1.40)
HCT: 29 % — ABNORMAL LOW (ref 39.0–52.0)
Hemoglobin: 9.9 g/dL — ABNORMAL LOW (ref 13.0–17.0)
O2 Saturation: 91 %
Patient temperature: 98.4
Potassium: 3.8 mmol/L (ref 3.5–5.1)
Sodium: 142 mmol/L (ref 135–145)
TCO2: 26 mmol/L (ref 22–32)
pCO2 arterial: 44.4 mmHg (ref 32–48)
pH, Arterial: 7.344 — ABNORMAL LOW (ref 7.35–7.45)
pO2, Arterial: 63 mmHg — ABNORMAL LOW (ref 83–108)

## 2021-09-04 LAB — CBC
HCT: 31.8 % — ABNORMAL LOW (ref 39.0–52.0)
Hemoglobin: 10.1 g/dL — ABNORMAL LOW (ref 13.0–17.0)
MCH: 27.7 pg (ref 26.0–34.0)
MCHC: 31.8 g/dL (ref 30.0–36.0)
MCV: 87.4 fL (ref 80.0–100.0)
Platelets: 136 10*3/uL — ABNORMAL LOW (ref 150–400)
RBC: 3.64 MIL/uL — ABNORMAL LOW (ref 4.22–5.81)
RDW: 14.6 % (ref 11.5–15.5)
WBC: 12.2 10*3/uL — ABNORMAL HIGH (ref 4.0–10.5)
nRBC: 0 % (ref 0.0–0.2)

## 2021-09-04 LAB — COMPREHENSIVE METABOLIC PANEL
ALT: 480 U/L — ABNORMAL HIGH (ref 0–44)
AST: 82 U/L — ABNORMAL HIGH (ref 15–41)
Albumin: 2.3 g/dL — ABNORMAL LOW (ref 3.5–5.0)
Alkaline Phosphatase: 50 U/L (ref 38–126)
Anion gap: 5 (ref 5–15)
BUN: 24 mg/dL — ABNORMAL HIGH (ref 8–23)
CO2: 23 mmol/L (ref 22–32)
Calcium: 7.8 mg/dL — ABNORMAL LOW (ref 8.9–10.3)
Chloride: 112 mmol/L — ABNORMAL HIGH (ref 98–111)
Creatinine, Ser: 0.95 mg/dL (ref 0.61–1.24)
GFR, Estimated: >60 mL/min (ref >60)
Glucose, Bld: 103 mg/dL — ABNORMAL HIGH (ref 70–99)
Potassium: 3.5 mmol/L (ref 3.5–5.1)
Sodium: 140 mmol/L (ref 135–145)
Total Bilirubin: 0.8 mg/dL (ref 0.3–1.2)
Total Protein: 4.9 g/dL — ABNORMAL LOW (ref 6.5–8.1)

## 2021-09-04 LAB — RPR: RPR Ser Ql: NONREACTIVE

## 2021-09-04 LAB — GLUCOSE, CAPILLARY
Glucose-Capillary: 92 mg/dL (ref 70–99)
Glucose-Capillary: 91 mg/dL (ref 70–99)
Glucose-Capillary: 114 mg/dL — ABNORMAL HIGH (ref 70–99)
Glucose-Capillary: 114 mg/dL — ABNORMAL HIGH (ref 70–99)
Glucose-Capillary: 100 mg/dL — ABNORMAL HIGH (ref 70–99)
Glucose-Capillary: 108 mg/dL — ABNORMAL HIGH (ref 70–99)

## 2021-09-04 MED ORDER — DOCUSATE SODIUM 100 MG PO CAPS
100.0000 mg | ORAL_CAPSULE | Freq: Two times a day (BID) | ORAL | Status: DC
  Administered 2021-09-08: 100 mg via ORAL
  Filled 2021-09-04 (×4): qty 1

## 2021-09-04 MED ORDER — POTASSIUM CHLORIDE 20 MEQ PO PACK
40.0000 meq | PACK | Freq: Once | ORAL | Status: AC
  Administered 2021-09-04: 40 meq via ORAL
  Filled 2021-09-04: qty 2

## 2021-09-04 MED ORDER — FUROSEMIDE 10 MG/ML IJ SOLN
20.0000 mg | Freq: Every day | INTRAMUSCULAR | Status: DC
  Administered 2021-09-04 – 2021-09-08 (×5): 20 mg via INTRAVENOUS
  Filled 2021-09-04 (×5): qty 2

## 2021-09-04 MED ORDER — BISACODYL 10 MG RE SUPP
10.0000 mg | Freq: Once | RECTAL | Status: AC
  Administered 2021-09-04: 10 mg via RECTAL
  Filled 2021-09-04: qty 1

## 2021-09-04 MED ORDER — ENSURE ENLIVE PO LIQD
237.0000 mL | Freq: Three times a day (TID) | ORAL | Status: DC
  Administered 2021-09-04 – 2021-09-08 (×7): 237 mL via ORAL

## 2021-09-04 MED ORDER — POTASSIUM CHLORIDE CRYS ER 20 MEQ PO TBCR
40.0000 meq | EXTENDED_RELEASE_TABLET | Freq: Once | ORAL | Status: DC
  Filled 2021-09-04: qty 2

## 2021-09-04 MED ORDER — ADULT MULTIVITAMIN W/MINERALS CH
1.0000 | ORAL_TABLET | Freq: Every day | ORAL | Status: DC
  Administered 2021-09-04 – 2021-09-08 (×5): 1 via ORAL
  Filled 2021-09-04 (×5): qty 1

## 2021-09-04 MED ORDER — POLYETHYLENE GLYCOL 3350 17 G PO PACK
17.0000 g | PACK | Freq: Every day | ORAL | Status: DC
  Filled 2021-09-04: qty 1

## 2021-09-04 NOTE — Progress Notes (Signed)
NAME:  Bennett Ram, MRN:  [id number], DOB:  1933-09-06, LOS: 3 ADMISSION DATE:  09/01/2021, CONSULTATION DATE:  09/01/21 REFERRING MD:  Adela Lank, CHIEF COMPLAINT:  AMS   History of Present Illness:  88 year old with PMH of CAD s/p CABG, IPF recently started on pirfenidone, atrial fibrillation on xarelto who is a retired Investment banker, operational BIBEMS after being found unresponsive by wife this morning, LKN 8/5 evening.  Initially hypotensive with EMS, given fluids, placed on nonrebreather .  Intubated for airway protection due to low GCS, hypothermic, started on Levophed and broad-spectrum antibiotics Labs show WBC count of 10, lactic 1.5, mild hyperkalemia, new renal failure BUN/creatinine 40/1.8, elevated LFTs VBG was 7.2 6/82/130. Head CT showed previous subdural hematoma and almost resolved, moderate chronic right MCA infarct CT abdomen showed stranding around the pancreas consistent with acute pancreatitis with small volume ascites and small effusions , no hydronephrosis.   He was started on pirfenidone about a month ago.  Recent UTI 2 weeks ago, treated with macrodentin s/p cytoscopy in early July with residual stone.  He had a fall at home on 7/30, complained of left shoulder pain, x-ray negative per orthopedics but since then his mental status has been declining with hallucinations, bedwetting and increasing weakness.  ED visit on 8/3 with negative head CT , VBG was 7.43/57 , no leukocytosis, normal renal function. Ambien was started 8/3.  Pertinent  Medical History  CAD s/p CABG,  IPF on pirfenidone, follows with Dr. Everardo All Chronic bronchitis, remote smoker, on Breo 07/08/21 FVC 2.51 (65%) FEV1 1.97 (73%) Ratio 73  TLC 74% DLCO 54% Interpretation: Moderate restrictive defect with moderately reduced DLCO HTN,  CVA,  atrial fibrillation Xarelto  Subdural hematoma 03/2021 requiring embolization of meningeal artery    Significant Hospital Events: Including procedures, antibiotic start and stop  dates in addition to other pertinent events   7/30 fall  8/3 ED visit 8/6 admitted for unresponsiveness, CVC placed 8/7 troponins elevated with t wave inversion, cards consulted type 2 NSTEMI 8/8 extubated 8/9 levophed discontinued  Interim History / Subjective:  No overnight events. Remains on levophed.  Maintaining oxygen saturations on room air.  Objective   Blood pressure 114/61, pulse 87, temperature 98.7 F (37.1 C), temperature source Oral, resp. rate (!) 21, weight 85.8 kg, SpO2 96 %.    Vent Mode: PSV;CPAP FiO2 (%):  [30 %] 30 % PEEP:  [5 cmH20] 5 cmH20 Pressure Support:  [5 cmH20] 5 cmH20   Intake/Output Summary (Last 24 hours) at 09/04/2021 0640 Last data filed at 09/04/2021 0425 Legaspi per 24 hour  Intake 1476.36 ml  Output 1950 ml  Net -473.64 ml    Filed Weights   09/02/21 0241 09/02/21 1300 09/03/21 0413  Weight: 82.3 kg 84.4 kg 85.8 kg    Examination: General:  elderly man, sitting up in bed, in no acute distress PULM: CTAB, normal work of breathing on room air CV: RRR, no mgr GI: BS+, soft, nontender MSK: normal bulk and tone, trace edema to lower extremities, skin tear present on left forearm. Neuro: alert and oriented x3  Creatinine 0.95 K 3.5 BUN 24 WBC trending down from 13 to 12 Hgb 10.2 Blood culture ngtd Respiratory culture moderate WBC present, few gram+ cocci in pairs Urine culture staph epidermidis > 100,000  Resolved Hospital Problem list   AKI Acute respiratory failure  Assessment & Plan:   Acute metabolic encephalopathy-multifactorial Mentation is improving following extubation yesterday. He continued to require levophed so thinking that urine culture more  likely form true infection leading to septic shock. Still thinking that presentation multifactorial with medications, infection, and decreased PO intake. He has been having visual hallucinations over the last few weeks and neurology evaluated him with recommendations to follow-up  with [facility] movement disorders as outpatient. -continue abx -limit sedating medications  Sepsis- improving Intial presentation was unclear with normal WBC, UA showed white clumps, and lactate <2. WBC trended up to 13, but is improving to 12 today. Urine culture grew staph epi and respiratory culture with few gram + cocci in pairs.  -start vancomycin, previously treated with zosyn on day 4/5  NSTEMI Prior history of CAD s/p CABG 1992 (LIMA to LAD and SVG to OM). Nuclear stress test in April 2022 showed inferior and lateral fixed defects with possibility of peri-infarct ischemia at that time he refused invasive angiography. Echo 2022 with EF 55%. Cardiology consulted and think that troponin elevation likely from type 2 NSTEMI. -heparin discontinue, xarelto restarted -Dr. Odis Hollingshead following  Chronic hypercarbic respiratory failure Underlying IPF , PFTs have not shown significant airway obstruction He is maintaining saturations on room air. Pirfenidone discontinued. Will have patient follow-up with Dr. Everardo All as outpatient.  Elevated LFTs-improving CT showed signs concerning for pancreatitis, however lipase was wnl.  -trend CMP  Levophed discontinued this AM. If stable will transfer patient to [facility] in PM following removal of central line.  Best Practice (right click and "Reselect all SmartList Selections" daily)   Diet/type: NPO DVT prophylaxis: DOAC GI prophylaxis: N/A and PPI Lines: No longer needed.  Order written to d/c  Foley:  N/A Code Status:  full code Last date of multidisciplinary goals of care discussion [8/9]  Alexandrina Fiorini M. Elienai Gailey, D.O.  Internal Medicine Resident, PGY-2 [facility]  Pager: [phone] 6:40 AM, 09/04/2021

## 2021-09-04 NOTE — Progress Notes (Signed)
Speech Language Pathology Treatment: Dysphagia  Patient Details Name: Calvin Brown MRN: [id number] DOB: 1933-02-06 Today's Date: 09/04/2021 Time: 0900-0920 SLP Time Calculation (min) (ACUTE ONLY): 20 min  Assessment / Plan / Recommendation Clinical Impression  Patient seen by SLP for skilled treatment session focused on dysphagia goals. His daughter was present in the room as well. He was alert and seemed more lucid than previous date but still is tangential during conversation and requires verbal cues to redirect attention. He had requested changing his diet as he does not like the pureed food. SLP observed patient with thin liquids (coffee) and solids (graham crackers). He was able to feed himself and did not exhibit any difficulties with mastication, swallow initiation appeared timely and no oral residuals post swallows. He required verbal cues to redirect attention to eating as he would become distracted. SLP is recommending upgrade diet to Dys 3 (mechanical soft) solids and thin liquids and will follow briefly for toleration and ability to upgrade.    HPI HPI: There are moderate to large infiltrates in both lower lobes, more so  on the right side suggesting atelectasis/pneumonia. There are small  patchy ground-glass and reticulonodular infiltrates in both upper  lobes, more so on the right side suggesting possible multifocal  pneumonia. Small bilateral pleural effusions.      SLP Plan  Continue with current plan of care      Recommendations for follow up therapy are one component of a multi-disciplinary discharge planning process, led by the attending physician.  Recommendations may be updated based on patient status, additional functional criteria and insurance authorization.    Recommendations  Diet recommendations: Dysphagia 3 (mechanical soft);Thin liquid Liquids provided via: Cup;Straw Medication Administration: Whole meds with puree Supervision: Patient able to self feed;Full  supervision/cueing for compensatory strategies Compensations: Minimize environmental distractions;Slow rate;Small sips/bites Postural Changes and/or Swallow Maneuvers: Seated upright 90 degrees                Oral Care Recommendations: Oral care BID;Staff/trained caregiver to provide oral care Follow Up Recommendations: Follow physician's recommendations for discharge plan and follow up therapies Assistance recommended at discharge: Frequent or constant Supervision/Assistance SLP Visit Diagnosis: Dysphagia, unspecified (R13.10) Plan: Continue with current plan of care          Angela Nevin, MA, CCC-SLP Speech Therapy

## 2021-09-04 NOTE — TOC Progression Note (Addendum)
Transition of Care [facility]) - Progression Note    Patient Details  Name: Calvin Brown MRN: [id number] Date of Birth: 1933/11/25  Transition of [facility]) CM/SW Contact  Beckie Busing, RN Phone [phone]  09/04/2021, 1:28 PM  Clinical Narrative:    TOC following patient with high risk for readmission and home health recommendations. CM at bedside for high risk screen and to offer home health choices. Patient is currently receiving patient care and per staff family has stepped out of room for lunch. CM will attempt to round on patient at a later time.   Patient  is currently active with [facility].        Expected Discharge Plan and Services                                                 Social Determinants of Health (SDOH) Interventions    Readmission Risk Interventions     No data to display

## 2021-09-04 NOTE — Progress Notes (Signed)
Progress Note  Patient Name: Calvin Brown Date of Encounter: 09/04/2021  Attending physician: Oretha MilchAlva, Rakesh V, MD Primary care provider: Charlane FerrettiSkakle, [place], DO Primary Cardiologist: Tessa LernerSunit Dotti Busey, DO, [facility]  Subjective: Calvin Flavorslton Greis is a 88 y.o. male who was seen and examined at bedside  Family at bedside. Clinically improving since yesterday's visit Prior to this hospitalization he did not have anginal discomfort or heart failure symptoms. Case discussed and reviewed with his nurse and CCM team  Objective: Vital Signs in the last 24 hours: Temp:  [97.8 F (36.6 C)-98.8 F (37.1 C)] 98.8 F (37.1 C) (08/09 1537) Pulse Rate:  [77-98] 80 (08/09 1500) Resp:  [18-36] 36 (08/09 1500) BP: (95-114)/(50-63) 97/50 (08/09 1500) SpO2:  [84 %-100 %] 95 % (08/09 1500) Arterial Line BP: (94-153)/(37-62) 120/41 (08/09 1500)  Intake/Output:  Intake/Output Summary (Last 24 hours) at 09/04/2021 1544 Last data filed at 09/04/2021 1400 [place] per 24 hour  Intake 1004.91 ml  Output 1250 ml  Net -245.09 ml    Net IO Since Admission: 6,617.67 mL [09/04/21 1544]  Weights:  Filed Weights   09/02/21 0241 09/02/21 1300 09/03/21 0413  Weight: 82.3 kg 84.4 kg 85.8 kg    Telemetry: Personally reviewed.  No significant dysrhythmia  Physical examination: PHYSICAL EXAM: Temp:  [97.8 F (36.6 C)-98.8 F (37.1 C)] 98.8 F (37.1 C) (08/09 1537) Pulse Rate:  [77-98] 80 (08/09 1500) Cardiac Rhythm: Atrial fibrillation (08/09 1200) Resp:  [18-36] 36 (08/09 1500) BP: (95-114)/(50-63) 97/50 (08/09 1500) SpO2:  [84 %-100 %] 95 % (08/09 1500) Arterial Line BP: (94-153)/(37-62) 120/41 (08/09 1500)  Today's Vitals   09/04/21 1430 09/04/21 1445 09/04/21 1500 09/04/21 1537  BP:   (!) 97/50   Pulse: 77 79 80   Resp: (!) 24 (!) 29 (!) 36   Temp:    98.8 F (37.1 C)  TempSrc:    Axillary  SpO2: 93% 94% 95%   Weight:      PainSc:       Body mass index is 26.38 kg/m.  CONSTITUTIONAL: Frail, appears older than  stated age, temporal wasting  SKIN: Skin is warm and dry. No rash noted. No cyanosis. No pallor. No jaundice HEAD: Normocephalic and atraumatic.  EYES: No scleral icterus MOUTH/THROAT: Dry oral membranes.  NECK: No JVD present. No thyromegaly noted.  Right carotid bruits  CHEST Normal respiratory effort. No intercostal retractions  LUNGS: Equal rise and fall of chest cavity, rales noted bilaterally, no rhonchi. CARDIOVASCULAR: Irregularly irregular, variable S1-S2, no murmurs rubs or gallops appreciated.   ABDOMINAL: Soft, nontender, nondistended, positive bowel sounds in all 4 quadrants, no apparent ascites.  EXTREMITIES: No pitting edema, warm to touch, ecchymoses noted in bilateral extremities NEUROLOGIC: Moves all 4 extremities, alert oriented x 3, nonfocal.  Lab Results: Hematology Recent Labs  Lab 09/02/21 0412 09/03/21 0421 09/04/21 0349 09/04/21 1445  WBC 11.6* 13.3* 12.2*  --   RBC 4.09* 4.10* 3.64*  --   HGB 11.4* 11.2* 10.1* 9.9*  HCT 35.3* 35.7* 31.8* 29.0*  MCV 86.3 87.1 87.4  --   MCH 27.9 27.3 27.7  --   MCHC 32.3 31.4 31.8  --   RDW 14.0 14.6 14.6  --   PLT 167 146* 136*  --     Chemistry Recent Labs  Lab 09/01/21 1030 09/01/21 1041 09/02/21 0412 09/03/21 0421 09/04/21 0349 09/04/21 1445  NA 140   < > 140 141 140 142  K 5.6*   < > 3.9 3.7 3.5 3.8  CL  99   < > 111 112* 112*  --   CO2 32   < > 23 24 23   --   GLUCOSE 132*   < > 107* 127* 103*  --   BUN 40*   < > 33* 26* 24*  --   CREATININE 1.81*   < > 1.25* 0.90 0.95  --   CALCIUM 8.6*   < > 7.6* 7.7* 7.8*  --   PROT 6.1*  --   --  5.0* 4.9*  --   ALBUMIN 3.2*  --   --  2.3* 2.3*  --   AST 795*  --   --  213* 82*  --   ALT 928*  --   --  709* 480*  --   ALKPHOS 66  --   --  50 50  --   BILITOT 0.8  --   --  0.6 0.8  --   GFRNONAA 36*   < > 56* >60 >60  --   ANIONGAP 9   < > 6 5 5   --    < > = values in this interval not displayed.     Cardiac Enzymes: Cardiac Panel (last 3 results) Recent  Labs    09/01/21 1722 09/01/21 2047 09/02/21 1154  TROPONINIHS 2,111* 3,384* 2,161*    BNP (last 3 results) Recent Labs    01/29/21 0932 04/15/21 1639  BNP 162.7* 224.3*    ProBNP (last 3 results) Recent Labs    02/04/21 0939 02/18/21 0920 05/22/21 0916  PROBNP 1,077* 600* 570*     DDimer No results for input(s): "DDIMER" in the last 168 hours.   Hemoglobin A1c: No results found for: "HGBA1C", "MPG"  TSH  Recent Labs    09/03/21 1423  TSH 0.975    Lipid Panel     Component Value Date/Time   CHOL 96 (L) 12/31/2020 0919   TRIG 24 09/01/2021 1722   HDL 41 12/31/2020 0919   LDLCALC 38 12/31/2020 0919   LDLDIRECT 38 12/31/2020 0922    Imaging: VAS 14/05/2020 LOWER EXTREMITY VENOUS (DVT)  Result Date: 09/03/2021  Lower Venous DVT Study Patient Name:  COLLEN VINCENT  Date of Exam:   09/03/2021 Medical Rec #: Calvin Flavors    Accession #:    11/03/2021 Date of Birth: February 16, 1933    Patient Gender: M Patient Age:   20 years Exam Location:  [facility] Procedure:      VAS 88 LOWER EXTREMITY VENOUS (DVT) Referring Phys: [facility] ALVA --------------------------------------------------------------------------------  Indications: Swelling, Edema, and suspected pe.  Comparison Study: no prior Performing Technologist: [place] RVS  Examination Guidelines: A complete evaluation includes B-mode imaging, spectral Doppler, color Doppler, and power Doppler as needed of all accessible portions of each vessel. Bilateral testing is considered an integral part of a complete examination. Limited examinations for reoccurring indications may be performed as noted. The reflux portion of the exam is performed with the patient in reverse Trendelenburg.  +---------+---------------+---------+-----------+----------+--------------+ RIGHT    CompressibilityPhasicitySpontaneityPropertiesThrombus Aging +---------+---------------+---------+-----------+----------+--------------+ CFV      Full            Yes      Yes                                 +---------+---------------+---------+-----------+----------+--------------+ SFJ      Full                                                        +---------+---------------+---------+-----------+----------+--------------+  FV Prox  Full                                                        +---------+---------------+---------+-----------+----------+--------------+ FV Mid   Full                                                        +---------+---------------+---------+-----------+----------+--------------+ FV DistalFull                                                        +---------+---------------+---------+-----------+----------+--------------+ PFV      Full                                                        +---------+---------------+---------+-----------+----------+--------------+ POP      Full           Yes      Yes                                 +---------+---------------+---------+-----------+----------+--------------+ PTV      Full                                                        +---------+---------------+---------+-----------+----------+--------------+ PERO     Full                                                        +---------+---------------+---------+-----------+----------+--------------+   +---------+---------------+---------+-----------+----------+--------------+ LEFT     CompressibilityPhasicitySpontaneityPropertiesThrombus Aging +---------+---------------+---------+-----------+----------+--------------+ CFV      Full           Yes      Yes                                 +---------+---------------+---------+-----------+----------+--------------+ SFJ      Full                                                        +---------+---------------+---------+-----------+----------+--------------+ FV Prox  Full                                                         +---------+---------------+---------+-----------+----------+--------------+  FV Mid   Full                                                        +---------+---------------+---------+-----------+----------+--------------+ FV DistalFull                                                        +---------+---------------+---------+-----------+----------+--------------+ PFV      Full                                                        +---------+---------------+---------+-----------+----------+--------------+ POP      Full           Yes      Yes                                 +---------+---------------+---------+-----------+----------+--------------+ PTV      Full                                                        +---------+---------------+---------+-----------+----------+--------------+ PERO     Full                                                        +---------+---------------+---------+-----------+----------+--------------+     Summary: BILATERAL: - No evidence of deep vein thrombosis seen in the lower extremities, bilaterally. -No evidence of popliteal cyst, bilaterally.   *See table(s) above for measurements and observations. Electronically signed by Waverly Ferrari MD on 09/03/2021 at 7:17:53 PM.    Final     RADIOLOGY: April 23, 2021: CT of the head without contrast: No acute intracranial findings are seen in noncontrast CT brain. Old infarct is seen in the right frontal lobe in the right MCA distribution. Atrophy.  There is large subcutaneous hematoma in the right posterior parietal scalp. No fracture is seen in the calvarium.   April 21, 2021: CT of the head without contrast Interval development of an epidural fluid collection overlying the  left cerebral hemisphere, likely reflecting subacute blood products. No internal brain herniation.   April 22, 2021: CT of the head without contrast 1. Unchanged low to intermediate density subdural  collection along the left convexity, measuring up to 7 mm in thickness and probably a hematoma. No midline shift. 2. Right posterior scalp hematoma.   May 03, 2021: IR transcatheter/embolization 1. Successful onyx embolization of the left middle meningeal artery for chronic subdural hematoma  09/02/2021: CT PE study: There is no evidence of central pulmonary artery embolism. There is small linear low-density in his segmental branch and right lower lobe, possibly suggesting PE with very  small thrombus burden. If clinically warranted, please consider venous Doppler examination of both lower extremities. There are no signs of acute right ventricular strain.   There are moderate to large infiltrates in both lower lobes, more so on the right side suggesting atelectasis/pneumonia. There are small patchy ground-glass and reticulonodular infiltrates in both upper lobes, more so on the right side suggesting possible multifocal pneumonia. Small bilateral pleural effusions.   Severe coronary artery disease.   There is ectasia of the ascending thoracic aorta measuring 4.2 cm. There is ectasia of left side of aortic arch measuring 3.9 cm. Recommend annual imaging followup by CTA or MRA. This recommendation follows 2010 ACCF/AHA/AATS/ACR/ASA/SCA/SCAI/SIR/STS/SVM Guidelines for the Diagnosis and Management of Patients with Thoracic Aortic Disease. Circulation.2010; 121: A355-D322. Aortic aneurysm NOS (ICD10-I71.9)   There is mild decrease in height of the bodies of T3 and T5 vertebrae which may suggest recent or old compression fractures.   Possible 3 mm left renal calculus. Minimal ascites.   CARDIAC DATABASE: EKG: 05/31/2021: Atrial fibrillation, 84 bpm, without underlying injury pattern.    09/01/2021:  1047: Atrial fibrillation, 76 bpm, without underlying injury pattern. 19:52 atrial fibr injuryillation, 86 bpm, T wave inversions in the anterior precordial leads suggestive of ischemia, no ST  elevation.  Compared to prior ECG T wave inversions are new.   Echocardiogram: 04/16/2020: LVEF 55-60%, inferior basal hypokinesis, mildly dilated LV cavity, mild LVH, RV size and function normal, severely dilated left atrium, mild MR, moderate TR, mild AI, mild to moderate aortic valve sclerosis without stenosis, ascending aorta 39 mm.   01/29/2021: LVEF 55%, severely dilated left atrium, moderate AI, mild MR, moderate TR, RVSP 50 mmHg, mild to moderate pulmonic regurgitation.   09/02/2021: LVEF 45-50%, mildly reduced LVEF, global hypokinesis, basal septal hypertrophy, IVS flattened in both systole and diastole consistent with RV pressure and volume overload, right ventricular systolic function severely reduced in size mildly dilated, LA moderately dilated, RA moderately dilated, mild to moderate TR, mild to moderate AR, proximal ascending aorta 40 mm, RAP 3 mmHg.   Stress Testing: Last MPI Jun April 2022 with Dr. Hyacinth Meeker in [place],: Per report: Perfusion study is abnormal, fixed perfusion defect seen on imaging, mild reversibility. Poor exercise tolerance. No exercise-induced arrhythmia. Normal exercise blood pressure response. No exercise-induced ECG changes. Anterolateral infarct. Mild anterolateral hypokinesis LVEF calculated 55%. Mild anterolateral hypokinesis.  Fixed inferior lateral perfusion defect with some reversibility peri-infarct.  Mildly abnormal scan   Heart Catheterization: 5 to 6 years ago, per patient we will request records.   Carotid artery duplex 05/16/2021: Duplex suggests stenosis in the right internal carotid artery (16-49%). Duplex suggests stenosis in the right external carotid artery (<50%). Duplex suggests stenosis in the left internal carotid artery (50-69%). Duplex suggests stenosis in the left external carotid artery (<50%). Antegrade right vertebral artery flow. Antegrade left vertebral artery flow. Significant change since 10/23/2020. Follow up in  six months is appropriate if clinically indicated.  Scheduled Meds:  arformoterol  15 mcg Nebulization BID   Chlorhexidine Gluconate Cloth  6 each Topical Daily   docusate sodium  100 mg Oral BID   feeding supplement  237 mL Oral TID BM   multivitamin with minerals  1 tablet Oral Daily   mouth rinse  15 mL Mouth Rinse 4 times per day   pantoprazole  40 mg Oral Daily   polyethylene glycol  17 g Oral Daily   rivaroxaban  20 mg Oral Q supper   sodium chloride flush  10-40  mL Intracatheter Q12H   tamsulosin  0.8 mg Oral Daily    Continuous Infusions:  vancomycin Stopped (09/04/21 1329)    PRN Meds: docusate sodium, ipratropium-albuterol, mouth rinse, polyethylene glycol, sodium chloride flush   IMPRESSION & RECOMMENDATIONS: Bluford Sedler is a 88 y.o. Caucasian male whose past medical history and cardiac risk factors include: Hypertension, hyperlipidemia, CVA in 2015, persistent atrial fibrillation, CAD status post CABG in 1992 (LIMA to the LAD and SVG to OM) at age of 78, HFrEF, history of GI bleed secondary to AVM in 2015, scalp hematoma (03/2021), subdural hematoma s/p left middle meningeal artery embolization (April 2023), aortic atherosclerosis, former smoker, idiopathic pulmonary fibrosis, advanced age.  Impression: NSTEMI Septic shock -resolved Acute encephalopathy -resolved Biventricular heart failure. Coronary artery disease with prior CABG without angina pectoris. Idiopathic pulmonary fibrosis. Persistent atrial fibrillation. Long-term oral anticoagulation. History of GI bleed secondary to AVM in 2015. History of subdural hematoma status post left middle meningeal artery embolization (April 2023)   Recommendations: NSTEMI: High sensitive troponins peaked at 3384 and TWI on EKG new compared to prior EKGs. Likely due to supply demand ischemia in the setting of septic shock, acute encephalopathy, acute hypercarbic respiratory failure. Per wife and family he did not have  anginal discomfort or heart failure symptoms prior to arrival  Echocardiogram noted mild reduction in LVEF without any obvious RWMA. The shared decision was to hold off on the heart catheterization. His troponin is down trended and EKG changes resolved as the underlying cause was being corrected. Postextubation he did not have any anginal discomfort and today he also refuses any exertional or resting chest pain.  He would like to hold off on any cardiovascular invasive testing at this time and get back to baseline. Appears to be congested on physical examination likely due to IV fluid resuscitation in the setting of sepsis. Start Lasix 20 mg IV push daily Incentive spirometer encouraged 10 times an hour as tolerated If and when stable would recommend restarting Entresto 24/26 mg p.o. twice daily-his home dose.  Chronic biventricular heart failure: Recent echocardiogram notes both right ventricular dysfunction and LVEF 45-50%.  Will start IV lasix to restore euvolemic. Start home dose Entresto as his BP improves.  Uptitration of medications has been difficult given his soft BP, renal function, and self-titration of medications. He does have IPF which predisposes him to [facility]. He refused RHC in the past but can be considered as outpatient if and when he agrees.   Coronary disease with prior CABG without angina pectoris: Asymptomatic. Monitor for now. Restart home medications. Echo results noted above.  Hx of abnormal MPI w/ his prior cardiologist at that time and thereafter he has refused LHC as he has been asymptomatic.  Check Lipids and direct LDL   Ectasia of ascending thoracic aorta and aortic arch (42 and 39 mm respectively):  Monitor BP  Annual follow up recommended by radiology - which would be August 2024  Cardiology will follow peripherally.  Call if questions or concerns arise.  Spoke to patient, wife, daughter, and son during afternoon rounds.  Spoke to [facility] team as well.    Will arrange follow up visit after discharge.   Patient's questions and concerns were addressed to his satisfaction. He voices understanding of the instructions provided during this encounter.   This note was created using a voice recognition software as a result there may be grammatical errors inadvertently enclosed that do not reflect the nature of this encounter. Every attempt is made to  correct such errors.  Total time spent: 35 minutes.   Delilah Shan [facility]  Pager: [phone] Office: [phone] 09/04/2021, 3:44 PM

## 2021-09-04 NOTE — Progress Notes (Signed)
Initial Nutrition Assessment  DOCUMENTATION CODES:   Non-severe (moderate) malnutrition in context of chronic illness  INTERVENTION:   Ensure Enlive po TID, each supplement provides 350 kcal and 20 grams of protein.   MVI with minerals daily.  NUTRITION DIAGNOSIS:   Moderate Malnutrition related to chronic illness (CAD) as evidenced by mild fat depletion, moderate fat depletion, mild muscle depletion, moderate muscle depletion.  GOAL:   Patient will meet greater than or equal to 90% of their needs  MONITOR:   Vent status, TF tolerance, Labs  REASON FOR ASSESSMENT:   Ventilator, Consult Enteral/tube feeding initiation and management  ASSESSMENT:   88 yo male admitted with septic shock, non-STEMI. PMH includes HTN,CAD, CVA, HLD, A fib.  Discussed patient in ICU rounds and with RN today.  Patient was extubated 8/8. SLP following and now on a dysphagia 3 diet with thin liquids. Meal intakes documented at 10-25%. Patient would benefit from PO supplements to maximize intake of protein and calories.   Labs reviewed.  CBG: [phone]  Medications reviewed and include Flomax, Colace, Miralax.   Intake/Output Summary (Last 24 hours) at 09/04/2021 1513 Last data filed at 09/04/2021 1400 [place] per 24 hour  Intake 1004.91 ml  Output 1250 ml  Net -245.09 ml    Diet Order:   Diet Order             DIET DYS 3 Room service appropriate? Yes; Fluid consistency: Thin  Diet effective now                   EDUCATION NEEDS:   Not appropriate for education at this time  Skin:  Skin Assessment: Reviewed RN Assessment  Last BM:  PTA  Height:   Ht Readings from Last 1 Encounters:  08/29/21 5\' 11"  (1.803 m)    Weight:   Wt Readings from Last 1 Encounters:  09/03/21 85.8 kg     BMI:  Body mass index is 26.38 kg/m.  Estimated Nutritional Needs:   Kcal:  2200-2400  Protein:  115-135 gm  Fluid:  2.2-2.4 L   11/03/21 RD, LDN, CNSC Please refer to Amion  for contact information.

## 2021-09-04 NOTE — Evaluation (Signed)
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 Intubated 8/6-8/8. Pt with septic shock, encephalopathy, respiratory failure and NSTEMI due to demand ischemia. PMhx: fall 08/25/21 with increased hallucinations and bed wetting, HTN, HLD, CVA, Afib, CAD, CABG, HFrEF, GIB, SDH, pulmonary fibrosis   Clinical Impression   Pt admitted for concerns listed above. PTA pt reported that he was getting some assist with BADL's, however able to complete most with increased time. At this time, pt presents with increased weakness, balance deficits, cognitive deficits, and decreased activity tolerance. He is requiring up to max A with LB ADL's in standing. Additinoally, pt is requiring increased verbal cues for all tasks. Recommending HHOT with max family support and HH services. OT will follow acutely.       Recommendations for follow up therapy are one component of a multi-disciplinary discharge planning process, led by the attending physician.  Recommendations may be updated based on patient status, additional functional criteria and insurance authorization.   Follow Up Recommendations  Home health OT    Assistance Recommended at Discharge Frequent or constant Supervision/Assistance  Patient can return home with the following A lot of help with walking and/or transfers;A lot of help with bathing/dressing/bathroom;Assistance with cooking/housework;Direct supervision/assist for medications management;Help with stairs or ramp for entrance    Functional Status Assessment  Patient has had a recent decline in their functional status and demonstrates the ability to make significant improvements in function in a reasonable and predictable amount of time.  Equipment Recommendations  None recommended by OT    Recommendations for Other Services        Precautions / Restrictions Precautions Precautions: Fall Restrictions Weight Bearing Restrictions: No      Mobility Bed Mobility Overal bed mobility: Needs Assistance Bed Mobility: Sit to Supine       Sit to supine: Min assist   General bed mobility comments: Min A to bring BLE into bed and reposition    Transfers Overall transfer level: Needs assistance Equipment used: Rolling walker (2 wheels) Transfers: Sit to/from Stand, Bed to chair/wheelchair/BSC Sit to Stand: Min assist     Step pivot transfers: Mod assist     General transfer comment: Cues for hand placement and min A to power up, mod A for stability and taking steps to bed from [facility]      Balance Overall balance assessment: Needs assistance, History of Falls Sitting-balance support: No upper extremity supported, Feet supported Sitting balance-Leahy Scale: Fair     Standing balance support: Bilateral upper extremity supported, Reliant on assistive device for balance Standing balance-Leahy Scale: Poor Standing balance comment: bil UE support on RW                           ADL either performed or assessed with clinical judgement   ADL Overall ADL's : Needs assistance/impaired Eating/Feeding: Set up;Sitting   Grooming: Set up;Sitting   Upper Body Bathing: Minimal assistance;Sitting   Lower Body Bathing: Maximal assistance;Sitting/lateral leans;Sit to/from stand   Upper Body Dressing : Minimal assistance;Sitting   Lower Body Dressing: Maximal assistance;Sitting/lateral leans;Sit to/from stand   Toilet Transfer: Moderate assistance;Stand-pivot;Rolling walker (2 wheels)   Toileting- Clothing Manipulation and Hygiene: Maximal assistance;Sit to/from stand;Sitting/lateral lean       Functional mobility during ADLs: Moderate assistance;Rolling walker (2 wheels) General ADL Comments: Limited by weakness,  balance deficits, and activity tolerance     Vision Baseline Vision/History: 1  Wears glasses Ability to See in Adequate Light: 0 Adequate Patient Visual Report: No change from baseline Vision Assessment?: No apparent visual deficits     Perception     Praxis      Pertinent Vitals/Pain Pain Assessment Pain Assessment: No/denies pain     Hand Dominance Right   Extremity/Trunk Assessment Upper Extremity Assessment Upper Extremity Assessment: Generalized weakness   Lower Extremity Assessment Lower Extremity Assessment: Generalized weakness   Cervical / Trunk Assessment Cervical / Trunk Assessment: Normal   Communication Communication Communication: No difficulties   Cognition Arousal/Alertness: Awake/alert Behavior During Therapy: WFL for tasks assessed/performed Overall Cognitive Status: Impaired/Different from baseline Area of Impairment: Orientation, Safety/judgement, Problem solving                 Orientation Level: Disoriented to, Time       Safety/Judgement: Decreased awareness of safety, Decreased awareness of deficits   Problem Solving: Slow processing General Comments: Following approximately 75% of simple commands, requires frequent redirection     General Comments  VSS on RA    Exercises     Shoulder Instructions      Home Living Family/patient expects to be discharged to:: Private residence Living Arrangements: Spouse/significant other Available Help at Discharge: Family;Available 24 hours/day Type of Home: House Home Access: Stairs to enter [employer] of Steps: 3 Entrance Stairs-Rails: Left Home Layout: One level;Able to live on main level with bedroom/bathroom;Two level     Bathroom Shower/Tub: Producer, television/film/video: Handicapped height     Home Equipment: Cane - single Librarian, academic (2 wheels)          Prior Functioning/Environment Prior Level of Function : Needs assist             Mobility Comments: pt uses cane but pt states wife wants him to use RW, falls, assist  for stairs ADLs Comments: wife does the IADLs and assists with bathing, was attending OPOT        OT Problem List: Decreased strength;Decreased activity tolerance;Impaired balance (sitting and/or standing);Decreased cognition;Decreased safety awareness      OT Treatment/Interventions: Therapeutic exercise;Self-care/ADL training;Energy conservation;DME and/or AE instruction;Therapeutic activities;Cognitive remediation/compensation;Patient/family education;Balance training    OT Goals(Current goals can be found in the care plan section) Acute Rehab OT Goals Patient Stated Goal: None stated this session OT Goal Formulation: With patient/family Time For Goal Achievement: 09/18/21 Potential to Achieve Goals: Fair ADL Goals Pt Will Perform Grooming: with min guard assist;standing Pt Will Perform Lower Body Bathing: with min assist;sitting/lateral leans;sit to/from stand Pt Will Perform Lower Body Dressing: with min assist;sitting/lateral leans;sit to/from stand Pt Will Transfer to Toilet: with min guard assist;ambulating Pt Will Perform Toileting - Clothing Manipulation and hygiene: with min guard assist;sitting/lateral leans;sit to/from stand  OT Frequency: Min 2X/week    Co-evaluation              AM-PAC OT "6 Clicks" Daily Activity     Outcome Measure Help from another person eating meals?: A Little Help from another person taking care of personal grooming?: A Little Help from another person toileting, which includes using toliet, bedpan, or urinal?: A Lot Help from another person bathing (including washing, rinsing, drying)?: A Lot Help from another person to put on and taking off regular upper body clothing?: A Little Help from another person to put on and taking off regular lower body clothing?: A Lot 6 Click Score:  15   End of Session Equipment Utilized During Treatment: Gait belt;Rolling walker (2 wheels) Nurse Communication: Mobility status  Activity Tolerance: Patient  tolerated treatment well Patient left: in bed;with call bell/phone within reach;with bed alarm set  OT Visit Diagnosis: Unsteadiness on feet (R26.81);Other abnormalities of gait and mobility (R26.89);Muscle weakness (generalized) (M62.81)                Time: 0100-7121 OT Time Calculation (min): 11 min Charges:  OT General Charges $OT Visit: 1 Visit OT Evaluation $OT Eval Moderate Complexity: 1 Mod  Mignonne Afonso H., OTR/L Acute Rehabilitation  Frimy Uffelman Elane Daviyon Widmayer 09/04/2021, 5:07 PM

## 2021-09-04 NOTE — Consult Note (Signed)
   [facility] Inpatient Consult   09/04/2021  Calvin Brown 05-04-1933 [id number]  [facility] [[facility]]  Accountable Care Organization [ACO] Patient: Medicare ACO REACH  Primary Care Provider:  Charlane Ferretti, DO is with [facility]  Patient is currently active with [facility] [[facility]] Care Management for chronic disease management services.  Patient has been engaged by a [facility] NP-G.  Our community based plan of care has focused on disease management and community resource support.    Patient is currently in ICU level of care  currently extubated 09/03/21  Plan: Will follow up for progress with the  Inpatient Transition Of Care [TOC] team member to make aware that [facility] Care Management following.  Of note, [facility] Care Management services does not replace or interfere with any services that are needed or arranged by inpatient [facility] care management team.  For additional questions or referrals please contact:  Charlesetta Shanks, RN BSN CCM [facility]  [phone] business mobile phone Toll free office [phone]  Fax number: [phone] [email or web address]

## 2021-09-05 ENCOUNTER — Encounter: Payer: Self-pay | Admitting: *Deleted

## 2021-09-05 DIAGNOSIS — G9340 Encephalopathy, unspecified: Secondary | ICD-10-CM | POA: Diagnosis not present

## 2021-09-05 LAB — CULTURE, RESPIRATORY W GRAM STAIN: Culture: NORMAL

## 2021-09-05 LAB — COMPREHENSIVE METABOLIC PANEL
ALT: 300 U/L — ABNORMAL HIGH (ref 0–44)
AST: 43 U/L — ABNORMAL HIGH (ref 15–41)
Albumin: 2.2 g/dL — ABNORMAL LOW (ref 3.5–5.0)
Alkaline Phosphatase: 53 U/L (ref 38–126)
Anion gap: 6 (ref 5–15)
BUN: 19 mg/dL (ref 8–23)
CO2: 24 mmol/L (ref 22–32)
Calcium: 8.2 mg/dL — ABNORMAL LOW (ref 8.9–10.3)
Chloride: 111 mmol/L (ref 98–111)
Creatinine, Ser: 0.92 mg/dL (ref 0.61–1.24)
GFR, Estimated: >60 mL/min (ref >60)
Glucose, Bld: 106 mg/dL — ABNORMAL HIGH (ref 70–99)
Potassium: 3.5 mmol/L (ref 3.5–5.1)
Sodium: 141 mmol/L (ref 135–145)
Total Bilirubin: 0.9 mg/dL (ref 0.3–1.2)
Total Protein: 5.1 g/dL — ABNORMAL LOW (ref 6.5–8.1)

## 2021-09-05 LAB — CBC
HCT: 36.6 % — ABNORMAL LOW (ref 39.0–52.0)
Hemoglobin: 11.3 g/dL — ABNORMAL LOW (ref 13.0–17.0)
MCH: 27.5 pg (ref 26.0–34.0)
MCHC: 30.9 g/dL (ref 30.0–36.0)
MCV: 89.1 fL (ref 80.0–100.0)
Platelets: 119 10*3/uL — ABNORMAL LOW (ref 150–400)
RBC: 4.11 MIL/uL — ABNORMAL LOW (ref 4.22–5.81)
RDW: 14.5 % (ref 11.5–15.5)
WBC: 8.1 10*3/uL (ref 4.0–10.5)
nRBC: 0 % (ref 0.0–0.2)

## 2021-09-05 LAB — LIPID PANEL
Cholesterol: 69 mg/dL (ref 0–200)
HDL: 30 mg/dL — ABNORMAL LOW (ref >40)
LDL Cholesterol: 27 mg/dL (ref 0–99)
Total CHOL/HDL Ratio: 2.3 RATIO
Triglycerides: 58 mg/dL (ref <150)
VLDL: 12 mg/dL (ref 0–40)

## 2021-09-05 LAB — GLUCOSE, CAPILLARY
Glucose-Capillary: 84 mg/dL (ref 70–99)
Glucose-Capillary: 101 mg/dL — ABNORMAL HIGH (ref 70–99)
Glucose-Capillary: 106 mg/dL — ABNORMAL HIGH (ref 70–99)

## 2021-09-05 LAB — LDL CHOLESTEROL, DIRECT: Direct LDL: 31 mg/dL (ref 0–99)

## 2021-09-05 MED ORDER — CHOLESTYRAMINE 4 G PO PACK
4.0000 g | PACK | Freq: Three times a day (TID) | ORAL | Status: DC
Start: 2021-09-05 — End: 2021-09-08
  Administered 2021-09-05 – 2021-09-08 (×9): 4 g via ORAL
  Filled 2021-09-05 (×15): qty 1

## 2021-09-05 NOTE — Progress Notes (Signed)
Pt arrived to floor around 1400 from 23M. Pt alert and oriented x4 in no acute distress. VSS. Respirations even and unlabored on room air. Pt's sons at bedside. Pt resting comfortably in recliner chair.

## 2021-09-05 NOTE — Progress Notes (Signed)
PROGRESS NOTE    Calvin Brown  WUJ:[id number] DOB: 18-May-1933 DOA: 09/01/2021 PCP: Charlane Ferretti, DO   Brief Narrative:  88 year old with PMH of CAD s/p CABG, IPF recently started on pirfenidone, atrial fibrillation on xarelto who is a retired Investment banker, operational BIBEMS after being found unresponsive by wife this morning, LKN 8/5 evening.  Initially hypotensive with EMS, given fluids, placed on nonrebreather .  Intubated for airway protection due to low GCS, hypothermic, started on Levophed and broad-spectrum antibiotics.  Significant events 7/30 fall  8/3 ED visit 8/6 admitted for unresponsiveness, CVC placed 8/7 troponins elevated with t wave inversion, cards consulted type 2 NSTEMI 8/8 extubated 8/9 levophed discontinued 8/10 Out of bed with PT - generally improving   Assessment & Plan:   Principal Problem:   Encephalopathy Active Problems:   Malnutrition of moderate degree   Septic shock (HCC)   Acute respiratory failure (HCC)  Acute metabolic encephalopathy-multifactorial, resolved Multifactorial in the setting of septic shock/UTI  Rule out concurrent polypharmacy, dehydration, malnutrition High risk for sundowning Hallucinations prior to admission - follow-up with [facility] as outpatient.   Septic shock, resolved Secondary to presumed UTI Urine culture grew staph epi and respiratory culture with few gram + cocci in pairs.  Completed vancomycin/zosyn   Heart failure, biventricular NSTEMI CAD history s/p CABG Cardiology following - Prior history of CAD s/p CABG 1992 (LIMA to LAD and SVG to OM). Nuclear stress test in April 2022 showed inferior and lateral fixed defects with possibility of peri-infarct ischemia at that time he refused invasive angiography.  - Echo 2022 with EF 55%. Cardiology consulted and think that troponin elevation likely from type 2 NSTEMI. -heparin discontinue, xarelto restarted   Chronic hypercarbic respiratory failure Underlying  IPF , PFTs have not shown significant airway obstruction -Pirfenidone discontinued.  -Will have patient follow-up with Dr. Everardo All as outpatient.   Afib, rate controlled Xarelto continued  Elevated LFTs-improving DC pirfendione CT showed signs concerning for pancreatitis, however lipase was wnl.  Labs improving - AST/ALT downtrending; ALT markedly elevated still, Bili WNL  DVT prophylaxis: Xarelto Code Status: Full Family Communication: At bedside  Status is: Inpt  Dispo: The patient is from: Home              Anticipated d/c is to: TBD              Anticipated d/c date is: 48-72h              Patient currently NOT medically stable for discharge  Consultants:  PCCM, Cardiology  Antimicrobials:  Vanc/zosyn  completed   Subjective: No acute issues or events overnight denies nausea vomiting constipation headache fevers chills or chest pain; minimal episodes of loose stool this morning, improving  Objective: Vitals:   09/05/21 0320 09/05/21 0400 09/05/21 0500 09/05/21 0600  BP:  (!) 96/57 120/65 120/64  Pulse:  84 86 82  Resp:  17 19 (!) 28  Temp: 98.2 F (36.8 C)     TempSrc: Oral     SpO2:  100% 100% 96%  Weight:        Intake/Output Summary (Last 24 hours) at 09/05/2021 0709 Last data filed at 09/04/2021 2000 Ruberg per 24 hour  Intake 755.36 ml  Output 1200 ml  Net -444.64 ml   Filed Weights   09/02/21 0241 09/02/21 1300 09/03/21 0413  Weight: 82.3 kg 84.4 kg 85.8 kg    Examination:  General:  Pleasantly resting in bed, No acute distress. HEENT:  Normocephalic  atraumatic.  Sclerae nonicteric, noninjected.  Extraocular movements intact bilaterally. Neck:  Without mass or deformity.  Trachea is midline. Lungs:  Clear to auscultate bilaterally without rhonchi, wheeze, or rales. Heart:  Regular rate and rhythm.  Without murmurs, rubs, or gallops. Abdomen:  Soft, nontender, nondistended.  Without guarding or rebound. Extremities: Bilateral upper extremity edema  and diffuse ecchymoses, 1+ pitting edema bilateral lower extremities to the knees. Vascular:  Dorsalis pedis and posterior tibial pulses palpable bilaterally. Skin:  Warm and dry, no erythema, no ulcerations.    Data Reviewed: I have personally reviewed following labs and imaging studies  CBC: Recent Labs  Lab 08/29/21 1334 08/29/21 1813 09/01/21 1030 09/01/21 1041 09/01/21 2300 09/02/21 0412 09/03/21 0421 09/04/21 0349 09/04/21 1445  WBC 10.3  --  10.4  --   --  11.6* 13.3* 12.2*  --   NEUTROABS 8.3*  --   --   --   --   --   --   --   --   HGB 12.4*   < > 12.8*   < > 11.9* 11.4* 11.2* 10.1* 9.9*  HCT 40.5   < > 43.1   < > 35.0* 35.3* 35.7* 31.8* 29.0*  MCV 90.2  --  93.3  --   --  86.3 87.1 87.4  --   PLT 208  --  190  --   --  167 146* 136*  --    < > = values in this interval not displayed.   Basic Metabolic Panel: Recent Labs  Lab 09/01/21 1030 09/01/21 1041 09/01/21 1230 09/01/21 1722 09/01/21 2200 09/01/21 2300 09/02/21 0412 09/03/21 0421 09/04/21 0349 09/04/21 1445  NA 140 138  138   < >  --  140 142 140 141 140 142  K 5.6* 5.5*  5.5*   < >  --  4.0 4.1 3.9 3.7 3.5 3.8  CL 99 100  --   --  109  --  111 112* 112*  --   CO2 32  --   --   --  24  --  23 24 23   --   GLUCOSE 132* 130*  --   --  106*  --  107* 127* 103*  --   BUN 40* 53*  --   --  36*  --  33* 26* 24*  --   CREATININE 1.81* 1.90*  --   --  1.35*  --  1.25* 0.90 0.95  --   CALCIUM 8.6*  --   --   --  7.4*  --  7.6* 7.7* 7.8*  --   MG  --   --   --  1.9  --   --  2.3 2.0  --   --   PHOS  --   --   --  4.4  --   --  2.9 2.5  --   --    < > = values in this interval not displayed.   GFR: Estimated Creatinine Clearance: 59.4 mL/min (by C-G formula based on SCr of 0.95 mg/dL). Liver Function Tests: Recent Labs  Lab 08/29/21 1334 09/01/21 1030 09/03/21 0421 09/04/21 0349  AST 19 795* 213* 82*  ALT 26 928* 709* 480*  ALKPHOS 58 66 50 50  BILITOT 0.6 0.8 0.6 0.8  PROT 6.5 6.1* 5.0* 4.9*   ALBUMIN 3.5 3.2* 2.3* 2.3*   Recent Labs  Lab 09/01/21 2047  LIPASE 27   Recent Labs  Lab 09/01/21 1150  AMMONIA 15   Coagulation Profile: Recent Labs  Lab 09/01/21 1030  INR 1.8*   Cardiac Enzymes: No results for input(s): "CKTOTAL", "CKMB", "CKMBINDEX", "TROPONINI" in the last 168 hours. BNP (last 3 results) Recent Labs    02/04/21 0939 02/18/21 0920 05/22/21 0916  PROBNP 1,077* 600* 570*   HbA1C: No results for input(s): "HGBA1C" in the last 72 hours. CBG: Recent Labs  Lab 09/04/21 1130 09/04/21 1515 09/04/21 1909 09/04/21 2307 09/05/21 0306  GLUCAP 114* 114* 100* 108* 84   Lipid Profile: No results for input(s): "CHOL", "HDL", "LDLCALC", "TRIG", "CHOLHDL", "LDLDIRECT" in the last 72 hours. Thyroid Function Tests: Recent Labs    09/03/21 1423  TSH 0.975   Anemia Panel: Recent Labs    09/03/21 1423  VITAMINB12 865  FOLATE 18.8   Sepsis Labs: Recent Labs  Lab 09/01/21 1030 09/01/21 1212 09/01/21 1644 09/01/21 1722 09/01/21 2047 09/02/21 0412 09/03/21 0421  PROCALCITON  --   --   --  0.26  --  0.42 0.43  LATICACIDVEN 1.5 2.0* 1.8  --  1.3  --   --     Recent Results (from the past 240 hour(s))  Urine Culture     Status: Abnormal   Collection Time: 09/01/21 11:20 AM   Specimen: Urine, Catheterized  Result Value Ref Range Status   Specimen Description URINE, CATHETERIZED  Final   Special Requests   Final    Normal Performed at [facility], [address], [place], [place] [address]    Culture >=100,000 COLONIES/mL STAPHYLOCOCCUS EPIDERMIDIS (A)  Final   Report Status 09/03/2021 FINAL  Final   Organism ID, Bacteria STAPHYLOCOCCUS EPIDERMIDIS (A)  Final      Susceptibility   Staphylococcus epidermidis - MIC*    CIPROFLOXACIN <=0.5 SENSITIVE Sensitive     GENTAMICIN <=0.5 SENSITIVE Sensitive     NITROFURANTOIN <=16 SENSITIVE Sensitive     OXACILLIN >=4 RESISTANT Resistant     TETRACYCLINE >=16 RESISTANT Resistant      VANCOMYCIN 1 SENSITIVE Sensitive     TRIMETH/SULFA 160 RESISTANT Resistant     CLINDAMYCIN >=8 RESISTANT Resistant     RIFAMPIN <=0.5 SENSITIVE Sensitive     Inducible Clindamycin NEGATIVE Sensitive     * >=100,000 COLONIES/mL STAPHYLOCOCCUS EPIDERMIDIS  Blood culture (routine x 2)     Status: None (Preliminary result)   Collection Time: 09/01/21 12:15 PM   Specimen: BLOOD  Result Value Ref Range Status   Specimen Description BLOOD RIGHT ANTECUBITAL  Final   Special Requests   Final    BOTTLES DRAWN AEROBIC AND ANAEROBIC Blood Culture results may not be optimal due to an excessive volume of blood received in culture bottles   Culture   Final    NO GROWTH 3 DAYS Performed at [facility], [address], [place], [place] [address]    Report Status PENDING  Incomplete  Blood culture (routine x 2)     Status: None (Preliminary result)   Collection Time: 09/01/21 12:20 PM   Specimen: BLOOD  Result Value Ref Range Status   Specimen Description BLOOD LEFT ANTECUBITAL  Final   Special Requests   Final    BOTTLES DRAWN AEROBIC AND ANAEROBIC Blood Culture adequate volume   Culture   Final    NO GROWTH 3 DAYS Performed at [facility], [address] [place], [place] [address]    Report Status PENDING  Incomplete  MRSA Next Gen by PCR, Nasal  Status: None   Collection Time: 09/01/21  3:45 PM   Specimen: Nasal Mucosa; Nasal Swab  Result Value Ref Range Status   MRSA by PCR Next Gen NOT DETECTED NOT DETECTED Final    Comment: (NOTE) The GeneXpert MRSA Assay (FDA approved for NASAL specimens only), is one component of a comprehensive MRSA colonization surveillance program. It is not intended to diagnose MRSA infection nor to guide or monitor treatment for MRSA infections. Test performance is not FDA approved in patients less than 86 years old. Performed at [facility], [address] [place], [place] [address]   Culture, Respiratory w Gram Stain     Status: None  (Preliminary result)   Collection Time: 09/03/21  4:05 AM   Specimen: Tracheal Aspirate; Respiratory  Result Value Ref Range Status   Specimen Description TRACHEAL ASPIRATE  Final   Special Requests NONE  Final   Gram Stain   Final    MODERATE WBC PRESENT,BOTH PMN AND MONONUCLEAR FEW GRAM POSITIVE COCCI IN PAIRS    Culture   Final    CULTURE REINCUBATED FOR BETTER GROWTH Performed at [facility], [address] [place], [place] [address]    Report Status PENDING  Incomplete         Radiology Studies: VAS US LOWER EXTREMITY VENOUS (DVT)  Result Date: 09/03/2021  Lower Venous DVT Study Patient Name:  Calvin FlavorsLTON Muldrew  Date of Exam:   09/03/2021 Medical Rec #: [id number]    Accession #:    [id number] Date of Birth: [birth year]    Patient Gender: M Patient Age:   [age] years Exam [facility] Procedure:      VAS US LOWER EXTREMITY VENOUS (DVT) [facility]erring [facility]: [facility] --------------------------------------------------------------------------------  Indications: Swelling, Edema, and suspected pe.  Comparison Study: no prior Performing Technologist: Argentina PonderMegan Stricklin RVS  Examination Guidelines: A complete evaluation includes B-mode imaging, spectral Doppler, color Doppler, and power Doppler as needed of all accessible portions of each vessel. Bilateral testing is considered an integral part of a complete examination. Limited examinations for reoccurring indications may be performed as noted. The reflux portion of the exam is performed with the patient in reverse Trendelenburg.  +---------+---------------+---------+-----------+----------+--------------+ RIGHT    CompressibilityPhasicitySpontaneityPropertiesThrombus Aging +---------+---------------+---------+-----------+----------+--------------+ CFV      Full           Yes      Yes                                 +---------+---------------+---------+-----------+----------+--------------+ SFJ      Full                                                         +---------+---------------+---------+-----------+----------+--------------+ FV Prox  Full                                                        +---------+---------------+---------+-----------+----------+--------------+ FV Mid   Full                                                        +---------+---------------+---------+-----------+----------+--------------+  FV DistalFull                                                        +---------+---------------+---------+-----------+----------+--------------+ PFV      Full                                                        +---------+---------------+---------+-----------+----------+--------------+ POP      Full           Yes      Yes                                 +---------+---------------+---------+-----------+----------+--------------+ PTV      Full                                                        +---------+---------------+---------+-----------+----------+--------------+ PERO     Full                                                        +---------+---------------+---------+-----------+----------+--------------+   +---------+---------------+---------+-----------+----------+--------------+ LEFT     CompressibilityPhasicitySpontaneityPropertiesThrombus Aging +---------+---------------+---------+-----------+----------+--------------+ CFV      Full           Yes      Yes                                 +---------+---------------+---------+-----------+----------+--------------+ SFJ      Full                                                        +---------+---------------+---------+-----------+----------+--------------+ FV Prox  Full                                                        +---------+---------------+---------+-----------+----------+--------------+ FV Mid   Full                                                         +---------+---------------+---------+-----------+----------+--------------+ FV DistalFull                                                        +---------+---------------+---------+-----------+----------+--------------+  PFV      Full                                                        +---------+---------------+---------+-----------+----------+--------------+ POP      Full           Yes      Yes                                 +---------+---------------+---------+-----------+----------+--------------+ PTV      Full                                                        +---------+---------------+---------+-----------+----------+--------------+ PERO     Full                                                        +---------+---------------+---------+-----------+----------+--------------+     Summary: BILATERAL: - No evidence of deep vein thrombosis seen in the lower extremities, bilaterally. -No evidence of popliteal cyst, bilaterally.   *See table(s) above for measurements and observations. Electronically signed by Waverly Ferrari MD on 09/03/2021 at 7:17:53 PM.    Final         Scheduled Meds:  arformoterol  15 mcg Nebulization BID   Chlorhexidine Gluconate Cloth  6 each Topical Daily   cholestyramine  4 g Oral TID   docusate sodium  100 mg Oral BID   feeding supplement  237 mL Oral TID BM   furosemide  20 mg Intravenous Daily   multivitamin with minerals  1 tablet Oral Daily   mouth rinse  15 mL Mouth Rinse 4 times per day   pantoprazole  40 mg Oral Daily   polyethylene glycol  17 g Oral Daily   rivaroxaban  20 mg Oral Q supper   sodium chloride flush  10-40 mL Intracatheter Q12H   tamsulosin  0.8 mg Oral Daily   Continuous Infusions:  vancomycin Stopped (09/04/21 1329)     LOS: 4 days   Time spent:  Azucena Fallen, DO [facility]  If 7PM-7AM, please contact night-coverage [email or web address]  09/05/2021, 7:09 AM

## 2021-09-05 NOTE — Progress Notes (Signed)
eLink Physician-Brief Progress Note Patient Name: Calvin Brown DOB: 1933-10-18 MRN: [id number]   Date of Service  09/05/2021  HPI/Events of Note  Nursing reports 6 watery stools.  eICU Interventions  Plan: Questran 4 gm PO now and TID.     Intervention Category Major Interventions: Other:  Lenell Antu 09/05/2021, 3:46 AM

## 2021-09-05 NOTE — Progress Notes (Signed)
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 Intubated 8/6-8/8. Pt with septic shock, encephalopathy, respiratory failure and NSTEMI due to demand ischemia. PMhx: fall 08/25/21 with increased hallucinations and bed wetting, HTN, HLD, CVA, Afib, CAD, CABG, HFrEF, GIB, SDH, pulmonary fibrosis    PT Comments    Pt admitted with above diagnosis. Pt was able to ambulate with RW with min assist and mod cues for safety with chair follow. Wife present today for session. Pt limited as he continues to have BMs and had to be changed mid walk and then continued.  At end of session, obtained shower cap as pt perseverating on washing hair and brushing teeth.  Wife and nurse assisting pt with ADL at end of session.   Pt currently with functional limitations due to balance and endurance deficits. Pt will benefit from skilled PT to increase their independence and safety with mobility to allow discharge to the venue listed below.      Recommendations for follow up therapy are one component of a multi-disciplinary discharge planning process, led by the attending physician.  Recommendations may be updated based on patient status, additional functional criteria and insurance authorization.  Follow Up Recommendations  Home health PT     Assistance Recommended at Discharge Frequent or constant Supervision/Assistance  Patient can return home with the following A little help with walking and/or transfers;A little help with bathing/dressing/bathroom;Assistance with cooking/housework;Direct supervision/assist for financial management;Assist for transportation;Direct supervision/assist for medications management;Help with stairs or ramp for entrance   Equipment Recommendations  None recommended by PT    Recommendations for Other Services        Precautions / Restrictions Precautions Precautions: Fall Restrictions Weight Bearing Restrictions: No     Mobility  Bed Mobility Overal bed mobility: Needs Assistance Bed Mobility: Supine to Sit, Rolling Rolling: Min guard   Supine to sit: Min assist, HOB elevated     General bed mobility comments: Assisted pt intiially with placing diaper on due to loose stools.  Pt needed a llittle assist to come to eOB    Transfers Overall transfer level: Needs assistance Equipment used: Rolling walker (2 wheels) Transfers: Sit to/from Stand, Bed to chair/wheelchair/BSC Sit to Stand: Min assist, +2 safety/equipment, From elevated surface           General transfer comment: Cues for hand placement and min A to power up, mod A for stability at times with pt leaning to right somewhat. Needed cues to stand tall.    Ambulation/Gait Ambulation/Gait assistance: Min assist, Mod assist, +2 safety/equipment Gait Distance (Feet): 100 Feet (25 feet and then 100 feet) Assistive device: Rolling walker (2 wheels) Gait Pattern/deviations: Step-through pattern, Decreased stride length, Decreased dorsiflexion - left, Decreased dorsiflexion - right, Trunk flexed   Gait velocity interpretation: <1.8 ft/sec, indicate of risk for recurrent falls   General Gait Details: pt with left foot drop with reliance on RW, min assist to control and direct RW with mod cues for stepping into RW and posture.  When pt got to door of room, diaper fell off and had to sit pt down and clean him and then placed a Depends that wife had on so he could continue walk. Asked wife to bring in pts AFOs and she stated she would do so.   Stairs             Wheelchair  Mobility    Modified Rankin (Stroke Patients Only)       Balance Overall balance assessment: Needs assistance, History of Falls Sitting-balance support: No upper extremity supported, Feet supported Sitting balance-Leahy Scale: Fair Sitting balance -  Comments: EOb without UE support   Standing balance support: Bilateral upper extremity supported, Reliant on assistive device for balance Standing balance-Leahy Scale: Poor Standing balance comment: bil UE support on RW                            Cognition Arousal/Alertness: Awake/alert Behavior During Therapy: WFL for tasks assessed/performed Overall Cognitive Status: Impaired/Different from baseline Area of Impairment: Orientation, Safety/judgement, Problem solving                 Orientation Level: Disoriented to, Time       Safety/Judgement: Decreased awareness of safety, Decreased awareness of deficits   Problem Solving: Slow processing General Comments: Following approximately 75% of simple commands, requires frequent redirection        Exercises General Exercises - Lower Extremity Ankle Circles/Pumps: AROM, Both, 10 reps, Seated Long Arc Quad: AROM, Both, 10 reps, Seated    General Comments General comments (skin integrity, edema, etc.): VSS on RA.      Pertinent Vitals/Pain Pain Assessment Pain Assessment: No/denies pain    Home Living                          Prior Function            PT Goals (current goals can now be found in the care plan section) Acute Rehab PT Goals Patient Stated Goal: return home Progress towards PT goals: Progressing toward goals    Frequency    Min 3X/week      PT Plan Current plan remains appropriate    Co-evaluation              AM-PAC PT "6 Clicks" Mobility   Outcome Measure  Help needed turning from your back to your side while in a flat bed without using bedrails?: A Little Help needed moving from lying on your back to sitting on the side of a flat bed without using bedrails?: A Little Help needed moving to and from a bed to a chair (including a wheelchair)?: A Little Help needed standing up from a chair using your arms (e.g., wheelchair or bedside chair)?: A Little Help  needed to walk in hospital room?: Total Help needed climbing 3-5 steps with a railing? : A Lot 6 Click Score: 15    End of Session Equipment Utilized During Treatment: Gait belt;Oxygen Activity Tolerance: Patient limited by fatigue Patient left: in chair;with call bell/phone within reach;with chair alarm set Nurse Communication: Mobility status PT Visit Diagnosis: Other abnormalities of gait and mobility (R26.89);Difficulty in walking, not elsewhere classified (R26.2);Muscle weakness (generalized) (M62.81);History of falling (Z91.81)     Time: 4259-5638 PT Time Calculation (min) (ACUTE ONLY): 32 min  Charges:  $Gait Training: 8-22 mins $Therapeutic Exercise: 8-22 mins                     [facility] M,PT Acute Rehab Services [phone]    Bevelyn Buckles 09/05/2021, 12:33 PM

## 2021-09-06 DIAGNOSIS — G9340 Encephalopathy, unspecified: Secondary | ICD-10-CM | POA: Diagnosis not present

## 2021-09-06 LAB — COMPREHENSIVE METABOLIC PANEL
ALT: 211 U/L — ABNORMAL HIGH (ref 0–44)
AST: 26 U/L (ref 15–41)
Albumin: 2.2 g/dL — ABNORMAL LOW (ref 3.5–5.0)
Alkaline Phosphatase: 46 U/L (ref 38–126)
Anion gap: 4 — ABNORMAL LOW (ref 5–15)
BUN: 19 mg/dL (ref 8–23)
CO2: 26 mmol/L (ref 22–32)
Calcium: 8.3 mg/dL — ABNORMAL LOW (ref 8.9–10.3)
Chloride: 110 mmol/L (ref 98–111)
Creatinine, Ser: 0.75 mg/dL (ref 0.61–1.24)
GFR, Estimated: >60 mL/min (ref >60)
Glucose, Bld: 100 mg/dL — ABNORMAL HIGH (ref 70–99)
Potassium: 3.6 mmol/L (ref 3.5–5.1)
Sodium: 140 mmol/L (ref 135–145)
Total Bilirubin: 0.8 mg/dL (ref 0.3–1.2)
Total Protein: 4.9 g/dL — ABNORMAL LOW (ref 6.5–8.1)

## 2021-09-06 LAB — CBC
HCT: 32.4 % — ABNORMAL LOW (ref 39.0–52.0)
Hemoglobin: 10.2 g/dL — ABNORMAL LOW (ref 13.0–17.0)
MCH: 27.5 pg (ref 26.0–34.0)
MCHC: 31.5 g/dL (ref 30.0–36.0)
MCV: 87.3 fL (ref 80.0–100.0)
Platelets: 110 10*3/uL — ABNORMAL LOW (ref 150–400)
RBC: 3.71 MIL/uL — ABNORMAL LOW (ref 4.22–5.81)
RDW: 14.4 % (ref 11.5–15.5)
WBC: 5.2 10*3/uL (ref 4.0–10.5)
nRBC: 0 % (ref 0.0–0.2)

## 2021-09-06 LAB — CULTURE, BLOOD (ROUTINE X 2)
Culture: NO GROWTH
Culture: NO GROWTH
Special Requests: ADEQUATE

## 2021-09-06 MED ORDER — NITROFURANTOIN MONOHYD MACRO 100 MG PO CAPS
100.0000 mg | ORAL_CAPSULE | Freq: Two times a day (BID) | ORAL | Status: DC
Start: 2021-09-06 — End: 2021-09-08
  Administered 2021-09-06 – 2021-09-08 (×4): 100 mg via ORAL
  Filled 2021-09-06 (×5): qty 1

## 2021-09-06 MED ORDER — ORAL CARE MOUTH RINSE: 15.0000 mL | OROMUCOSAL | Status: DC | PRN

## 2021-09-06 NOTE — Progress Notes (Signed)
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 Intubated 8/6-8/8. Pt with septic shock, encephalopathy, respiratory failure and NSTEMI due to demand ischemia. PMhx: fall 08/25/21 with increased hallucinations and bed wetting, HTN, HLD, CVA, Afib, CAD, CABG, HFrEF, GIB, SDH, pulmonary fibrosis   OT comments  Patient completed sit to stand from bedside chair with min A and verbal cues to push up from arm rests of chair.  Patient completed functional mobility to sink using RW and min A and stood at sink for approx 8 mins to complete grooming task of shaving his face with min guard.  While standing at sink, patient began to have BM and proceeded to ambulate into the bathroom and complete toilet transfer with min A and verbal cues for safe RW and hand placement.  Patient completed sit to stand from toilet with min A and total A for peri hygiene while standing.  Total A for LB bathing while seated on commode and total A for doffing soiled socks and doning clean grip socks while seated on commode.  Patient completed functional mobility back to bedside chair with min A using RW and verbal cues required for hand placement/RW use during transfer.  Patient would benefit from additional OT intervention to address functional deficits in order for patient to return to PLOF.   Recommendations for follow up therapy are one component of a multi-disciplinary discharge planning process, led by the attending physician.  Recommendations may be updated based on patient status, additional functional criteria and insurance authorization.    Follow Up Recommendations  Home health OT    Assistance Recommended at Discharge Frequent or constant Supervision/Assistance  Patient can return home with the following  A lot of help with  bathing/dressing/bathroom;Assistance with cooking/housework;Direct supervision/assist for medications management;Help with stairs or ramp for entrance;A little help with walking and/or transfers   Equipment Recommendations  None recommended by OT    Recommendations for Other Services      Precautions / Restrictions Precautions Precautions: Fall Restrictions Weight Bearing Restrictions: No       Mobility Bed Mobility                    Transfers Overall transfer level: Needs assistance Equipment used: Rolling walker (2 wheels) Transfers: Sit to/from Stand, Bed to chair/wheelchair/BSC Sit to Stand: Min guard, +2 safety/equipment     Step pivot transfers: Min guard           Balance Overall balance assessment: Needs assistance, History of Falls             Standing balance comment: bil UE support on RW and1 UE on sink during grooming task                           ADL either performed or assessed with clinical judgement   ADL Overall ADL's : Needs assistance/impaired Eating/Feeding: Set up;Sitting   Grooming: Wash/dry hands;Wash/dry face;Oral care;Standing;Min guard (shaving)       Lower Body Bathing: Maximal assistance;+2 for safety/equipment;Cueing for safety;Sitting/lateral leans       Lower Body Dressing: Maximal assistance (doning socks)   Toilet Transfer: Minimal assistance;Cueing for safety;Cueing for sequencing;Rolling walker (2 wheels);Grab bars   Toileting- Clothing Manipulation and Hygiene: Minimal assistance;Cueing for safety;With adaptive equipment;Sit to/from stand Toileting - Clothing Manipulation Details (indicate cue type and  reason):  (patient has BM while standing at sink resulting in BM getting on socks and back of LEs requiring total A for removing soiled clothing items)     Functional mobility during ADLs: Minimal assistance;Rolling walker (2 wheels);Cueing for safety      Extremity/Trunk Assessment Upper  Extremity Assessment Upper Extremity Assessment: Overall WFL for tasks assessed   Lower Extremity Assessment LLE Deficits / Details: foot drop with weakness from prior CVA        Vision   Vision Assessment?: No apparent visual deficits   Perception     Praxis      Cognition Arousal/Alertness: Awake/alert Behavior During Therapy: WFL for tasks assessed/performed Overall Cognitive Status: Impaired/Different from baseline Area of Impairment: Orientation, Safety/judgement, Problem solving                         Safety/Judgement: Decreased awareness of safety, Decreased awareness of deficits              Exercises      Shoulder Instructions       General Comments      Pertinent Vitals/ Pain       Pain Assessment Pain Assessment: No/denies pain Faces Pain Scale: No hurt Facial Expression: Relaxed, neutral  Home Living                                          Prior Functioning/Environment              Frequency  Min 2X/week        Progress Toward Goals  OT Goals(current goals can now be found in the care plan section)  Progress towards OT goals: Progressing toward goals  Acute Rehab OT Goals OT Goal Formulation: With patient/family Time For Goal Achievement: 09/18/21 Potential to Achieve Goals: Fair ADL Goals Pt Will Perform Grooming: with min guard assist;standing Pt Will Perform Lower Body Bathing: with min assist;sitting/lateral leans;sit to/from stand Pt Will Perform Lower Body Dressing: with min assist;sitting/lateral leans;sit to/from stand Pt Will Transfer to Toilet: with min guard assist;ambulating Pt Will Perform Toileting - Clothing Manipulation and hygiene: with min guard assist;sitting/lateral leans;sit to/from stand  Plan Discharge plan remains appropriate    Co-evaluation                 AM-PAC OT "6 Clicks" Daily Activity     Outcome Measure   Help from another person eating meals?: A  Little Help from another person taking care of personal grooming?: A Little Help from another person toileting, which includes using toliet, bedpan, or urinal?: A Lot Help from another person bathing (including washing, rinsing, drying)?: A Lot Help from another person to put on and taking off regular upper body clothing?: A Little Help from another person to put on and taking off regular lower body clothing?: A Lot 6 Click Score: 15    End of Session Equipment Utilized During Treatment: Gait belt;Rolling walker (2 wheels)  OT Visit Diagnosis: Unsteadiness on feet (R26.81);Other abnormalities of gait and mobility (R26.89);Muscle weakness (generalized) (M62.81)   Activity Tolerance Patient tolerated treatment well   Patient Left in chair;with chair alarm set;with family/visitor present;with call bell/phone within reach   Nurse Communication          Time: 6834-1962 OT Time Calculation (min): 47 min  Charges: OT Treatments $Self Care/Home Management : 38-52 mins  Governor Specking OTR/L  Denice Paradise 09/06/2021, 4:31 PM

## 2021-09-06 NOTE — Progress Notes (Signed)
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  Initially hypotensive with EMS, given fluids, placed on nonrebreather .  Intubated for airway protection due to low GCS, hypothermic, started on Levophed and broad-spectrum antibiotics.  Significant events 7/30 fall  8/3 ED visit 8/6 admitted for unresponsiveness, CVC placed 8/7 troponins elevated with t wave inversion, cards consulted type 2 NSTEMI 8/8 extubated 8/9 levophed discontinued 8/10 Out of bed with PT - generally improving   Assessment & Plan:   Principal Problem:   Encephalopathy Active Problems:   Malnutrition of moderate degree   Septic shock (HCC)   Acute respiratory failure (HCC)  Acute metabolic encephalopathy-multifactorial, resolved Multifactorial in the setting of septic shock/UTI  Rule out concurrent polypharmacy, dehydration, malnutrition High risk for sundowning Hallucinations prior to admission - follow-up with [facility] as outpatient.   Septic shock, resolved Secondary to presumed UTI Urine culture grew staph epi and respiratory culture with few gram + cocci in pairs.  Completed vancomycin/zosyn Concern for infected nephrolithiasis - add Macrobid to complete 10 day course per sideline discussion with urology   Heart failure, biventricular NSTEMI CAD history s/p CABG Cardiology following - Prior history of CAD s/p CABG 1992 (LIMA to LAD and SVG to OM). Nuclear stress test in April 2022 showed inferior and lateral fixed defects with possibility of peri-infarct ischemia at that time he refused invasive angiography.  - Echo 2022 with EF 55%. Cardiology consulted and think that troponin elevation likely  from type 2 NSTEMI. -heparin discontinue, xarelto restarted   Ambulatory dysfunction, acute on chronic  -Continue PT OT evaluation, discussed potential need for discharge to rehab versus home with home health. -Patient wears lower extremity compression stockings and braces at baseline, currently following outpatient therapy at home  Chronic hypercarbic respiratory failure Underlying IPF , PFTs have not shown significant airway obstruction -Pirfenidone discontinued.  -Will have patient follow-up with Dr. Loanne Drilling as outpatient.   Afib, rate controlled Xarelto continued  Elevated LFTs-improving DC pirfendione CT showed signs concerning for pancreatitis, however lipase was wnl.  Labs improving - AST/ALT downtrending; ALT markedly elevated still, Bili WNL  DVT prophylaxis: Xarelto Code Status: Full Family Communication: At bedside  Status is: Inpt  Dispo: The patient is from: Home              Anticipated d/c is to: TBD              Anticipated d/c date is: 48-72h              Patient currently NOT medically stable for discharge  Consultants:  PCCM, Cardiology  Antimicrobials:  Vanc/zosyn completed  Nitrofurantoin x 5 days (8/11-8/15)  Subjective: No acute issues or events overnight denies nausea vomiting constipation headache fevers chills or chest pain  Objective: Vitals:   09/06/21 0400 09/06/21 0434 09/06/21 0755 09/06/21 0807  BP: 114/64  114/64   Pulse: 90  98   Resp: 18  20   Temp: 97.8 F (36.6 C)  98.5 F (36.9 C)   TempSrc: Oral  Oral   SpO2: 91%  90% 90%  Weight:  98.7 kg      Intake/Output Summary (Last 24 hours) at 09/06/2021 [id number] Last data filed at 09/06/2021  1017 Cameron per 24 hour  Intake 357 ml  Output 1151 ml  Net -794 ml    Filed Weights   09/02/21 1300 09/03/21 0413 09/06/21 0434  Weight: 84.4 kg 85.8 kg 98.7 kg    Examination:  General:  Pleasantly resting in bed, No acute distress. HEENT:  Normocephalic atraumatic.  Sclerae  nonicteric, noninjected.  Extraocular movements intact bilaterally. Neck:  Without mass or deformity.  Trachea is midline. Lungs:  Clear to auscultate bilaterally without rhonchi, wheeze, or rales. Heart:  Regular rate and rhythm.  Without murmurs, rubs, or gallops. Abdomen:  Soft, nontender, nondistended.  Without guarding or rebound. Extremities: Bilateral upper extremity edema and diffuse ecchymoses, 1+ pitting edema bilateral lower extremities to the knees. Vascular:  Dorsalis pedis and posterior tibial pulses palpable bilaterally. Skin:  Warm and dry, no erythema, no ulcerations.    Data Reviewed: I have personally reviewed following labs and imaging studies  CBC: Recent Labs  Lab 09/02/21 0412 09/03/21 0421 09/04/21 0349 09/04/21 1445 09/05/21 0748 09/06/21 0450  WBC 11.6* 13.3* 12.2*  --  8.1 5.2  HGB 11.4* 11.2* 10.1* 9.9* 11.3* 10.2*  HCT 35.3* 35.7* 31.8* 29.0* 36.6* 32.4*  MCV 86.3 87.1 87.4  --  89.1 87.3  PLT 167 146* 136*  --  119* 110*    Basic Metabolic Panel: Recent Labs  Lab 09/01/21 1722 09/01/21 2200 09/02/21 0412 09/03/21 0421 09/04/21 0349 09/04/21 1445 09/05/21 0748 09/06/21 0450  NA  --    < > 140 141 140 142 141 140  K  --    < > 3.9 3.7 3.5 3.8 3.5 3.6  CL  --    < > 111 112* 112*  --  111 110  CO2  --    < > 23 24 23   --  24 26  GLUCOSE  --    < > 107* 127* 103*  --  106* 100*  BUN  --    < > 33* 26* 24*  --  19 19  CREATININE  --    < > 1.25* 0.90 0.95  --  0.92 0.75  CALCIUM  --    < > 7.6* 7.7* 7.8*  --  8.2* 8.3*  MG 1.9  --  2.3 2.0  --   --   --   --   PHOS 4.4  --  2.9 2.5  --   --   --   --    < > = values in this interval not displayed.    GFR: Estimated Creatinine Clearance: 79.4 mL/min (by C-G formula based on SCr of 0.75 mg/dL). Liver Function Tests: Recent Labs  Lab 09/01/21 1030 09/03/21 0421 09/04/21 0349 09/05/21 0748 09/06/21 0450  AST 795* 213* 82* 43* 26  ALT 928* 709* 480* 300* 211*  ALKPHOS 66 50 50 53 46   BILITOT 0.8 0.6 0.8 0.9 0.8  PROT 6.1* 5.0* 4.9* 5.1* 4.9*  ALBUMIN 3.2* 2.3* 2.3* 2.2* 2.2*    Recent Labs  Lab 09/01/21 2047  LIPASE 27    Recent Labs  Lab 09/01/21 1150  AMMONIA 15    Coagulation Profile: Recent Labs  Lab 09/01/21 1030  INR 1.8*    Cardiac Enzymes: No results for input(s): "CKTOTAL", "CKMB", "CKMBINDEX", "TROPONINI" in the last 168 hours. BNP (last 3 results) Recent Labs    02/04/21 0939 02/18/21 0920 05/22/21 0916  PROBNP 1,077* 600* 570*    HbA1C: No results for input(s): "HGBA1C" in the last 72 hours.  CBG: Recent Labs  Lab 09/04/21 1909 09/04/21 2307 09/05/21 0306 09/05/21 0726 09/05/21 1118  GLUCAP 100* 108* 84 101* 106*    Lipid Profile: Recent Labs    09/05/21 0748  CHOL 69  HDL 30*  LDLCALC 27  TRIG 58  CHOLHDL 2.3  LDLDIRECT 31   Thyroid Function Tests: Recent Labs    09/03/21 1423  TSH 0.975    Anemia Panel: Recent Labs    09/03/21 1423  VITAMINB12 865  FOLATE 18.8    Sepsis Labs: Recent Labs  Lab 09/01/21 1030 09/01/21 1212 09/01/21 1644 09/01/21 1722 09/01/21 2047 09/02/21 0412 09/03/21 0421  PROCALCITON  --   --   --  0.26  --  0.42 0.43  LATICACIDVEN 1.5 2.0* 1.8  --  1.3  --   --      Recent Results (from the past 240 hour(s))  Urine Culture     Status: Abnormal   Collection Time: 09/01/21 11:20 AM   Specimen: Urine, Catheterized  Result Value Ref Range Status   Specimen Description URINE, CATHETERIZED  Final   Special Requests   Final    Normal Performed at [facility], [address] [place], [place] [address]    Culture >=100,000 COLONIES/mL STAPHYLOCOCCUS EPIDERMIDIS (A)  Final   Report Status 09/03/2021 FINAL  Final   Organism ID, Bacteria STAPHYLOCOCCUS EPIDERMIDIS (A)  Final      Susceptibility   Staphylococcus epidermidis - MIC*    CIPROFLOXACIN <=0.5 SENSITIVE Sensitive     GENTAMICIN <=0.5 SENSITIVE Sensitive     NITROFURANTOIN <=16 SENSITIVE Sensitive      OXACILLIN >=4 RESISTANT Resistant     TETRACYCLINE >=16 RESISTANT Resistant     VANCOMYCIN 1 SENSITIVE Sensitive     TRIMETH/SULFA 160 RESISTANT Resistant     CLINDAMYCIN >=8 RESISTANT Resistant     RIFAMPIN <=0.5 SENSITIVE Sensitive     Inducible Clindamycin NEGATIVE Sensitive     * >=100,000 COLONIES/mL STAPHYLOCOCCUS EPIDERMIDIS  Blood culture (routine x 2)     Status: None   Collection Time: 09/01/21 12:15 PM   Specimen: BLOOD  Result Value Ref Range Status   Specimen Description BLOOD RIGHT ANTECUBITAL  Final   Special Requests   Final    BOTTLES DRAWN AEROBIC AND ANAEROBIC Blood Culture results may not be optimal due to an excessive volume of blood received in culture bottles   Culture   Final    NO GROWTH 5 DAYS Performed at [facility], [address] [place], [place] [address]    Report Status 09/06/2021 FINAL  Final  Blood culture (routine x 2)     Status: None   Collection Time: 09/01/21 12:20 PM   Specimen: BLOOD  Result Value Ref Range Status   Specimen Description BLOOD LEFT ANTECUBITAL  Final   Special Requests   Final    BOTTLES DRAWN AEROBIC AND ANAEROBIC Blood Culture adequate volume   Culture   Final    NO GROWTH 5 DAYS Performed at [facility], [address], [place], [place] [address]    Report Status 09/06/2021 FINAL  Final  MRSA Next Gen by PCR, Nasal     Status: None   Collection Time: 09/01/21  3:45 PM   Specimen: Nasal Mucosa; Nasal Swab  Result Value Ref Range Status   MRSA by PCR Next Gen NOT DETECTED NOT DETECTED Final    Comment: (NOTE) The GeneXpert MRSA Assay (FDA approved for NASAL specimens only), is  one component of a comprehensive MRSA colonization surveillance program. It is not intended to diagnose MRSA infection nor to guide or monitor treatment for MRSA infections. Test performance is not FDA approved in patients less than 74 years old. Performed at [facility], [address] [place], [place] [address]    Culture, Respiratory w Gram Stain     Status: None   Collection Time: 09/03/21  4:05 AM   Specimen: Tracheal Aspirate; Respiratory  Result Value Ref Range Status   Specimen Description TRACHEAL ASPIRATE  Final   Special Requests NONE  Final   Gram Stain   Final    MODERATE WBC PRESENT,BOTH PMN AND MONONUCLEAR FEW GRAM POSITIVE COCCI IN PAIRS    Culture   Final    FEW Normal respiratory flora-no Staph aureus or Pseudomonas seen Performed at [facility], [address], [place], [place] [address]    Report Status 09/05/2021 FINAL  Final         Radiology Studies: No results found.      Scheduled Meds:  arformoterol  15 mcg Nebulization BID   cholestyramine  4 g Oral TID   docusate sodium  100 mg Oral BID   feeding supplement  237 mL Oral TID BM   furosemide  20 mg Intravenous Daily   multivitamin with minerals  1 tablet Oral Daily   mouth rinse  15 mL Mouth Rinse 4 times per day   pantoprazole  40 mg Oral Daily   polyethylene glycol  17 g Oral Daily   rivaroxaban  20 mg Oral Q supper   tamsulosin  0.8 mg Oral Daily   Continuous Infusions:  vancomycin 1,750 mg (09/05/21 0959)     LOS: 5 days   Time spent: 84min  Indigo Barbian C Anelise Staron, DO [facility]  If 7PM-7AM, please contact night-coverage [email or web address]  09/06/2021, 8:29 AM

## 2021-09-06 NOTE — Progress Notes (Signed)
Speech Language Pathology Treatment: Dysphagia  Patient Details Name: Calvin Brown MRN: [id number] DOB: 1933/10/18 Today's Date: 09/06/2021 Time: 4734-0370 SLP Time Calculation (min) (ACUTE ONLY): 15 min  Assessment / Plan / Recommendation Clinical Impression  Pt was seen for dysphagia treatment with his wife present for part of the session. Pt's RN reported that the pt has been tolerating the current diet without signs of aspiration. Pt tolerated regular texture solids and thin liquids via cup and straw without overt s/s of aspiration despite use of consecutive swallows and his talking during p.o. intake. Oral phase was WNL. Pt's diet will be advanced to regular texture solids and thin liquids. SLP will continue to follow pt to ensure tolerance.   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 Pt Initially hypotensive with EMS, given fluids, placed on nonrebreather. Pt intubated for airway protection due to low GCS, hypothermic, started on Levophed and broad-spectrum antibiotics. ETT 8/6-8/8. CT chest: small patchy ground-glass and reticulonodular infiltrates in both upper lobes, more so on the right side suggesting possible multifocal pneumonia. PMH: CAD s/p CABG, IPF recently started on pirfenidone, atrial fibrillation on xarelto.      SLP Plan  Continue with current plan of care      Recommendations for follow up therapy are one component of a multi-disciplinary discharge planning process, led by the attending physician.  Recommendations may be updated based on patient status, additional functional criteria and insurance authorization.    Recommendations  Diet recommendations: Regular;Thin liquid Liquids provided via: Cup;Straw Medication Administration: Whole meds with puree Supervision: Patient able to self feed;Full supervision/cueing for compensatory strategies Compensations: Minimize environmental distractions;Slow  rate;Small sips/bites Postural Changes and/or Swallow Maneuvers: Seated upright 90 degrees                Oral Care Recommendations: Oral care BID Follow Up Recommendations: Follow physician's recommendations for discharge plan and follow up therapies SLP Visit Diagnosis: Dysphagia, unspecified (R13.10) Plan: Continue with current plan of care          Calvin Brown I. Vear Clock, MS, CCC-SLP Acute Rehabilitation Services Office number [phone]  Calvin Brown  09/06/2021, 10:15 AM

## 2021-09-06 NOTE — TOC Progression Note (Signed)
Transition of Care [facility]) - Progression Note    Patient Details  Name: Calvin Brown MRN: [id number] Date of Birth: Jun 04, 1933  Transition of Care [facility]) CM/SW Contact  Harriet Masson, RN Phone Number: 09/06/2021, 12:46 PM  Clinical Narrative:     Sherron Monday to patient and wife, Lynden Ang, at bedside regarding transition needs. Vicky states patient was active with [facility] and would like to use them again. Morrie Sheldon with [facility] accepted referral. Wife states patient has all needed DME. Wife spoke about being interested in finding private duty aide. TOC will continue to follow for needs.  Expected Discharge Plan: Home w Home Health Services Barriers to Discharge: Continued Medical Work up  Expected Discharge Plan and Services Expected Discharge Plan: Home w Home Health Services   Discharge Planning Services: CM Consult Post Acute Care Choice: Home Health Living arrangements for the past 2 months: Single Family Home                           HH Arranged: PT, OT HH Agency: [facility] Date HH Agency Contacted: 09/06/21 Time HH Agency Contacted: 1244 Representative spoke with at [facility] Agency: [place]   Social Determinants of Health (SDOH) Interventions    Readmission Risk Interventions     No data to display

## 2021-09-06 NOTE — Plan of Care (Signed)
  Problem: Activity: Goal: Ability to tolerate increased activity will improve Outcome: Progressing   Problem: Role Relationship: Goal: Method of communication will improve Outcome: Progressing   Problem: Education: Goal: Knowledge of General Education information will improve Description: Including pain rating scale, medication(s)/side effects and non-pharmacologic comfort measures Outcome: Progressing   Problem: Health Behavior/Discharge Planning: Goal: Ability to manage health-related needs will improve Outcome: Progressing   Problem: Clinical Measurements: Goal: Ability to maintain clinical measurements within normal limits will improve Outcome: Progressing Goal: Will remain free from infection Outcome: Progressing Goal: Respiratory complications will improve Outcome: Progressing   Problem: Activity: Goal: Risk for activity intolerance will decrease Outcome: Progressing   Problem: Nutrition: Goal: Adequate nutrition will be maintained Outcome: Progressing   Problem: Coping: Goal: Level of anxiety will decrease Outcome: Progressing   Problem: Elimination: Goal: Will not experience complications related to bowel motility Outcome: Progressing Goal: Will not experience complications related to urinary retention Outcome: Progressing   Problem: Pain Managment: Goal: General experience of comfort will improve Outcome: Progressing   Problem: Safety: Goal: Ability to remain free from injury will improve Outcome: Progressing   Problem: Skin Integrity: Goal: Risk for impaired skin integrity will decrease Outcome: Progressing

## 2021-09-06 NOTE — Plan of Care (Signed)
?  Problem: Activity: ?Goal: Ability to tolerate increased activity will improve ?Outcome: Progressing ?  ?Problem: Education: ?Goal: Knowledge of General Education information will improve ?Description: Including pain rating scale, medication(s)/side effects and non-pharmacologic comfort measures ?Outcome: Progressing ?  ?Problem: Health Behavior/Discharge Planning: ?Goal: Ability to manage health-related needs will improve ?Outcome: Progressing ?  ?Problem: Clinical Measurements: ?Goal: Ability to maintain clinical measurements within normal limits will improve ?Outcome: Progressing ?  ?

## 2021-09-07 DIAGNOSIS — G9340 Encephalopathy, unspecified: Secondary | ICD-10-CM | POA: Diagnosis not present

## 2021-09-07 NOTE — Progress Notes (Signed)
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 Intubated 8/6-8/8. Pt with septic shock, encephalopathy, respiratory failure and NSTEMI due to demand ischemia. PMhx: fall 08/25/21 with increased hallucinations and bed wetting, HTN, HLD, CVA, Afib, CAD, CABG, HFrEF, GIB, SDH, pulmonary fibrosis    PT Comments    Pt was able to preform more bouts of ambulation into the hallway today with min assist and RW.  He does get dyspneic with gait, but reports this is normal and stayed 90% or higher when his waveform was accurate on the monitor.  PT will continue to follow acutely for safe mobility progression   Recommendations for follow up therapy are one component of a multi-disciplinary discharge planning process, led by the attending physician.  Recommendations may be updated based on patient status, additional functional criteria and insurance authorization.  Follow Up Recommendations  Home health PT     Assistance Recommended at Discharge Frequent or constant Supervision/Assistance  Patient can return home with the following A little help with walking and/or transfers;A little help with bathing/dressing/bathroom;Assistance with cooking/housework;Direct supervision/assist for financial management;Assist for transportation;Direct supervision/assist for medications management;Help with stairs or ramp for entrance   Equipment Recommendations  None recommended by PT    Recommendations for Other Services       Precautions / Restrictions Precautions Precautions: Fall;Other (comment) Precaution Comments: monitor O2 with ambulation Required Braces or Orthoses: Other Brace Other Brace: bil LE AFOs-donn for gait     Mobility  Bed Mobility               General bed mobility comments: Pt was OOB in the recliner chair     Transfers Overall transfer level: Needs assistance Equipment used: Rolling walker (2 wheels) Transfers: Sit to/from Stand Sit to Stand: Min assist           General transfer comment: Min assist with momentum to get to standing from recliner chair x4    Ambulation/Gait Ambulation/Gait assistance: Min assist, +2 safety/equipment Gait Distance (Feet): 75 Feet (75' x 4 with seated rest breaks in the recliner in between) Assistive device: Rolling walker (2 wheels) Gait Pattern/deviations: Step-through pattern, Shuffle, Trunk flexed Gait velocity: decreased Gait velocity interpretation: <1.8 ft/sec, indicate of risk for recurrent falls   General Gait Details: Donned bil AFOs for walk today, DOE 2-3/4, O2 mostly in the low 90s with questionable reading while walking due to use of hands, poor waveform on monitor and finger pulse ox.   Stairs             Wheelchair Mobility    Modified Rankin (Stroke Patients Only)       Balance Overall balance assessment: Needs assistance Sitting-balance support: Feet supported, Bilateral upper extremity supported Sitting balance-Leahy Scale: Fair Sitting balance - Comments: supervision without bil UE support in sitting   Standing balance support: Bilateral upper extremity supported, Reliant on assistive device for balance Standing balance-Leahy Scale: Poor Standing balance comment: bil UE support on RW                            Cognition Arousal/Alertness: Awake/alert Behavior During Therapy: [facility] for tasks assessed/performed  General Comments: unsure of baseline        Exercises General Exercises - Upper Extremity Shoulder Flexion: AROM, Both, 10 reps Elbow Flexion: AROM, Both, 10 reps General Exercises - Lower Extremity Long Arc Quad: AROM, Both, 10 reps Hip Flexion/Marching: AROM, Both, 10 reps    General Comments        Pertinent Vitals/Pain Pain  Assessment Pain Assessment: No/denies pain Pain Score: 0-No pain    Home Living                          Prior Function            PT Goals (current goals can now be found in the care plan section) Acute Rehab PT Goals Patient Stated Goal: return home Progress towards PT goals: Progressing toward goals    Frequency    Min 3X/week      PT Plan Current plan remains appropriate    Co-evaluation              AM-PAC PT "6 Clicks" Mobility   Outcome Measure  Help needed turning from your back to your side while in a flat bed without using bedrails?: A Little Help needed moving from lying on your back to sitting on the side of a flat bed without using bedrails?: A Little Help needed moving to and from a bed to a chair (including a wheelchair)?: A Little Help needed standing up from a chair using your arms (e.g., wheelchair or bedside chair)?: A Little Help needed to walk in hospital room?: A Little Help needed climbing 3-5 steps with a railing? : A Lot 6 Click Score: 17    End of Session Equipment Utilized During Treatment: Gait belt Activity Tolerance: Patient limited by fatigue Patient left: in chair;with call bell/phone within reach;with family/visitor present   PT Visit Diagnosis: Other abnormalities of gait and mobility (R26.89);Difficulty in walking, not elsewhere classified (R26.2);Muscle weakness (generalized) (M62.81);History of falling (Z91.81)     Time: 0240-9735 PT Time Calculation (min) (ACUTE ONLY): 44 min  Charges:  $Gait Training: 23-37 mins $Therapeutic Exercise: 8-22 mins                     Corinna Capra, PT, DPT  Acute Rehabilitation Secure chat is best for contact #[phone] office

## 2021-09-07 NOTE — Progress Notes (Signed)
PROGRESS NOTE    Calvin Brown  RCV:[id number] DOB: 10-31-1933 DOA: 09/01/2021 PCP: Charlane Ferretti, DO   Brief Narrative:  88 year old with PMH of CAD s/p CABG, IPF recently started on pirfenidone, atrial fibrillation on xarelto who is a retired Investment banker, operational BIBEMS after being found unresponsive by wife this morning, LKN 8/5 evening.  Initially hypotensive with EMS, given fluids, placed on nonrebreather .  Intubated for airway protection due to low GCS, hypothermic, started on Levophed and broad-spectrum antibiotics.  Significant events 7/30 fall  8/3 ED visit 8/6 admitted for unresponsiveness, CVC placed 8/7 troponins elevated with t wave inversion, cards consulted type 2 NSTEMI 8/8 extubated 8/9 levophed discontinued 8/10 Out of bed with PT - generally improving  8/11-12 -continue PT, remains unsafe discharge home due to stability issues/ambulatory dysfunction  Assessment & Plan:   Principal Problem:   Encephalopathy Active Problems:   Malnutrition of moderate degree   Septic shock (HCC)   Acute respiratory failure (HCC)  Acute metabolic encephalopathy-multifactorial, resolved Multifactorial in the setting of septic shock/UTI  Rule out concurrent polypharmacy, dehydration, malnutrition High risk for sundowning Hallucinations prior to admission - follow-up with [facility] as outpatient.   Septic shock, resolved Secondary to presumed UTI Urine culture grew staph epi and respiratory culture with few gram + cocci in pairs.  Completed vancomycin/zosyn Concern for infected nephrolithiasis - add Macrobid to complete 10 day course per sideline discussion with urology   Heart failure, biventricular NSTEMI CAD history s/p CABG Cardiology following - Prior history of CAD s/p CABG 1992 (LIMA to LAD and SVG to OM). Nuclear stress test in April 2022 showed inferior and lateral fixed defects with possibility of peri-infarct ischemia at that time he refused invasive  angiography.  - Echo 2022 with EF 55%. Cardiology consulted and think that troponin elevation likely from type 2 NSTEMI. -heparin discontinue, xarelto restarted   Ambulatory dysfunction, acute on chronic  -Continue PT OT evaluation, discussed potential need for discharge to rehab versus home with home health. -Patient wears lower extremity compression stockings and braces at baseline, currently following outpatient therapy at home -questionable need for placement versus discharge back to outpatient therapy pending PT evaluation  Chronic hypercarbic respiratory failure Underlying IPF , PFTs have not shown significant airway obstruction -Pirfenidone discontinued.  -Will have patient follow-up with Dr. Everardo All as outpatient.   Afib, rate controlled Xarelto continued  Elevated LFTs-improving DC pirfendione CT showed signs concerning for pancreatitis, however lipase was wnl.  Labs improving - AST/ALT downtrending; ALT markedly elevated still, Bili WNL  DVT prophylaxis: Xarelto Code Status: Full Family Communication: At bedside  Status is: Inpt  Dispo: The patient is from: Home              Anticipated d/c is to: Likely home with home health versus outpatient therapy              Anticipated d/c date is: 24 hours              Patient currently NOT medically stable for discharge  Consultants:  PCCM, Cardiology  Antimicrobials:  Vanc/zosyn completed  Nitrofurantoin x 5 days (8/11-8/15)  Subjective: No acute issues or events overnight denies nausea vomiting constipation headache fevers chills or chest pain  Objective: Vitals:   09/07/21 0317 09/07/21 0737 09/07/21 0830 09/07/21 1156  BP: 128/70  (!) 140/62 127/71  Pulse: 92 87 89 88  Resp: (!) 27 (!) 24 19 20   Temp: 97.9 F (36.6 C)  97.6 F (36.4 C)  97.7 F (36.5 C)  TempSrc: Oral  Axillary Oral  SpO2: 91% 93% 90% 91%  Weight:        Intake/Output Summary (Last 24 hours) at 09/07/2021 1450 Last data filed at 09/07/2021  1154 [place] per 24 hour  Intake 412.22 ml  Output 2725 ml  Net -2312.78 ml    Filed Weights   09/02/21 1300 09/03/21 0413 09/06/21 0434  Weight: 84.4 kg 85.8 kg 98.7 kg    Examination:  General:  Pleasantly resting in bed, No acute distress. HEENT:  Normocephalic atraumatic.  Sclerae nonicteric, noninjected.  Extraocular movements intact bilaterally. Neck:  Without mass or deformity.  Trachea is midline. Lungs:  Clear to auscultate bilaterally without rhonchi, wheeze, or rales. Heart:  Regular rate and rhythm.  Without murmurs, rubs, or gallops. Abdomen:  Soft, nontender, nondistended.  Without guarding or rebound. Extremities: Bilateral upper extremity edema and diffuse ecchymoses, 1+ pitting edema bilateral lower extremities to the knees. Vascular:  Dorsalis pedis and posterior tibial pulses palpable bilaterally. Skin:  Warm and dry, no erythema, no ulcerations.    Data Reviewed: I have personally reviewed following labs and imaging studies  CBC: Recent Labs  Lab 09/02/21 0412 09/03/21 0421 09/04/21 0349 09/04/21 1445 09/05/21 0748 09/06/21 0450  WBC 11.6* 13.3* 12.2*  --  8.1 5.2  HGB 11.4* 11.2* 10.1* 9.9* 11.3* 10.2*  HCT 35.3* 35.7* 31.8* 29.0* 36.6* 32.4*  MCV 86.3 87.1 87.4  --  89.1 87.3  PLT 167 146* 136*  --  119* 110*    Basic Metabolic Panel: Recent Labs  Lab 09/01/21 1722 09/01/21 2200 09/02/21 0412 09/03/21 0421 09/04/21 0349 09/04/21 1445 09/05/21 0748 09/06/21 0450  NA  --    < > 140 141 140 142 141 140  K  --    < > 3.9 3.7 3.5 3.8 3.5 3.6  CL  --    < > 111 112* 112*  --  111 110  CO2  --    < > 23 24 23   --  24 26  GLUCOSE  --    < > 107* 127* 103*  --  106* 100*  BUN  --    < > 33* 26* 24*  --  19 19  CREATININE  --    < > 1.25* 0.90 0.95  --  0.92 0.75  CALCIUM  --    < > 7.6* 7.7* 7.8*  --  8.2* 8.3*  MG 1.9  --  2.3 2.0  --   --   --   --   PHOS 4.4  --  2.9 2.5  --   --   --   --    < > = values in this interval not displayed.     GFR: Estimated Creatinine Clearance: 79.4 mL/min (by C-G formula based on SCr of 0.75 mg/dL). Liver Function Tests: Recent Labs  Lab 09/01/21 1030 09/03/21 0421 09/04/21 0349 09/05/21 0748 09/06/21 0450  AST 795* 213* 82* 43* 26  ALT 928* 709* 480* 300* 211*  ALKPHOS 66 50 50 53 46  BILITOT 0.8 0.6 0.8 0.9 0.8  PROT 6.1* 5.0* 4.9* 5.1* 4.9*  ALBUMIN 3.2* 2.3* 2.3* 2.2* 2.2*    Recent Labs  Lab 09/01/21 2047  LIPASE 27    Recent Labs  Lab 09/01/21 1150  AMMONIA 15    Coagulation Profile: Recent Labs  Lab 09/01/21 1030  INR 1.8*    Cardiac Enzymes: No results for input(s): "CKTOTAL", "CKMB", "CKMBINDEX", "TROPONINI" in  the last 168 hours. BNP (last 3 results) Recent Labs    02/04/21 0939 02/18/21 0920 05/22/21 0916  PROBNP 1,077* 600* 570*    HbA1C: No results for input(s): "HGBA1C" in the last 72 hours. CBG: Recent Labs  Lab 09/04/21 1909 09/04/21 2307 09/05/21 0306 09/05/21 0726 09/05/21 1118  GLUCAP 100* 108* 84 101* 106*    Lipid Profile: Recent Labs    09/05/21 0748  CHOL 69  HDL 30*  LDLCALC 27  TRIG 58  CHOLHDL 2.3  LDLDIRECT 31    Thyroid Function Tests: No results for input(s): "TSH", "T4TOTAL", "FREET4", "T3FREE", "THYROIDAB" in the last 72 hours.  Anemia Panel: No results for input(s): "VITAMINB12", "FOLATE", "FERRITIN", "TIBC", "IRON", "RETICCTPCT" in the last 72 hours.  Sepsis Labs: Recent Labs  Lab 09/01/21 1030 09/01/21 1212 09/01/21 1644 09/01/21 1722 09/01/21 2047 09/02/21 0412 09/03/21 0421  PROCALCITON  --   --   --  0.26  --  0.42 0.43  LATICACIDVEN 1.5 2.0* 1.8  --  1.3  --   --      Recent Results (from the past 240 hour(s))  Urine Culture     Status: Abnormal   Collection Time: 09/01/21 11:20 AM   Specimen: Urine, Catheterized  Result Value Ref Range Status   Specimen Description URINE, CATHETERIZED  Final   Special Requests   Final    Normal Performed at [facility], [address] [place], [place] [address]    Culture >=100,000 COLONIES/mL STAPHYLOCOCCUS EPIDERMIDIS (A)  Final   Report Status 09/03/2021 FINAL  Final   Organism ID, Bacteria STAPHYLOCOCCUS EPIDERMIDIS (A)  Final      Susceptibility   Staphylococcus epidermidis - MIC*    CIPROFLOXACIN <=0.5 SENSITIVE Sensitive     GENTAMICIN <=0.5 SENSITIVE Sensitive     NITROFURANTOIN <=16 SENSITIVE Sensitive     OXACILLIN >=4 RESISTANT Resistant     TETRACYCLINE >=16 RESISTANT Resistant     VANCOMYCIN 1 SENSITIVE Sensitive     TRIMETH/SULFA 160 RESISTANT Resistant     CLINDAMYCIN >=8 RESISTANT Resistant     RIFAMPIN <=0.5 SENSITIVE Sensitive     Inducible Clindamycin NEGATIVE Sensitive     * >=100,000 COLONIES/mL STAPHYLOCOCCUS EPIDERMIDIS  Blood culture (routine x 2)     Status: None   Collection Time: 09/01/21 12:15 PM   Specimen: BLOOD  Result Value Ref Range Status   Specimen Description BLOOD RIGHT ANTECUBITAL  Final   Special Requests   Final    BOTTLES DRAWN AEROBIC AND ANAEROBIC Blood Culture results may not be optimal due to an excessive volume of blood received in culture bottles   Culture   Final    NO GROWTH 5 DAYS Performed at [facility], [address], [place], [place] [address]    Report Status 09/06/2021 FINAL  Final  Blood culture (routine x 2)     Status: None   Collection Time: 09/01/21 12:20 PM   Specimen: BLOOD  Result Value Ref Range Status   Specimen Description BLOOD LEFT ANTECUBITAL  Final   Special Requests   Final    BOTTLES DRAWN AEROBIC AND ANAEROBIC Blood Culture adequate volume   Culture   Final    NO GROWTH 5 DAYS Performed at [facility], [address] [place], [place] [address]    Report Status 09/06/2021 FINAL  Final  MRSA Next Gen by PCR, Nasal     Status: None   Collection Time: 09/01/21  3:45 PM  Specimen: Nasal Mucosa; Nasal Swab  Result Value Ref Range Status   MRSA by PCR Next Gen NOT DETECTED NOT DETECTED Final    Comment: (NOTE) The  GeneXpert MRSA Assay (FDA approved for NASAL specimens only), is one component of a comprehensive MRSA colonization surveillance program. It is not intended to diagnose MRSA infection nor to guide or monitor treatment for MRSA infections. Test performance is not FDA approved in patients less than 100 years old. Performed at [facility], [address] [place], [place] [address]   Culture, Respiratory w Gram Stain     Status: None   Collection Time: 09/03/21  4:05 AM   Specimen: Tracheal Aspirate; Respiratory  Result Value Ref Range Status   Specimen Description TRACHEAL ASPIRATE  Final   Special Requests NONE  Final   Gram Stain   Final    MODERATE WBC PRESENT,BOTH PMN AND MONONUCLEAR FEW GRAM POSITIVE COCCI IN PAIRS    Culture   Final    FEW Normal respiratory flora-no Staph aureus or Pseudomonas seen Performed at [facility], [address] [place], [place] [address]    Report Status 09/05/2021 FINAL  Final         Radiology Studies: No results found.      Scheduled Meds:  arformoterol  15 mcg Nebulization BID   cholestyramine  4 g Oral TID   docusate sodium  100 mg Oral BID   feeding supplement  237 mL Oral TID BM   furosemide  20 mg Intravenous Daily   multivitamin with minerals  1 tablet Oral Daily   nitrofurantoin (macrocrystal-monohydrate)  100 mg Oral Q12H   pantoprazole  40 mg Oral Daily   polyethylene glycol  17 g Oral Daily   rivaroxaban  20 mg Oral Q supper   tamsulosin  0.8 mg Oral Daily   Continuous Infusions:     LOS: 6 days   Time spent:  Azucena Fallen, DO [facility]  If 7PM-7AM, please contact night-coverage [email or web address]  09/07/2021, 2:50 PM

## 2021-09-08 DIAGNOSIS — G9340 Encephalopathy, unspecified: Secondary | ICD-10-CM | POA: Diagnosis not present

## 2021-09-08 LAB — CBC
HCT: 32.5 % — ABNORMAL LOW (ref 39.0–52.0)
Hemoglobin: 10.2 g/dL — ABNORMAL LOW (ref 13.0–17.0)
MCH: 27.3 pg (ref 26.0–34.0)
MCHC: 31.4 g/dL (ref 30.0–36.0)
MCV: 86.9 fL (ref 80.0–100.0)
Platelets: 138 10*3/uL — ABNORMAL LOW (ref 150–400)
RBC: 3.74 MIL/uL — ABNORMAL LOW (ref 4.22–5.81)
RDW: 14.1 % (ref 11.5–15.5)
WBC: 6.5 10*3/uL (ref 4.0–10.5)
nRBC: 0 % (ref 0.0–0.2)

## 2021-09-08 LAB — COMPREHENSIVE METABOLIC PANEL
ALT: 129 U/L — ABNORMAL HIGH (ref 0–44)
AST: 20 U/L (ref 15–41)
Albumin: 2.4 g/dL — ABNORMAL LOW (ref 3.5–5.0)
Alkaline Phosphatase: 52 U/L (ref 38–126)
Anion gap: 9 (ref 5–15)
BUN: 19 mg/dL (ref 8–23)
CO2: 28 mmol/L (ref 22–32)
Calcium: 8.4 mg/dL — ABNORMAL LOW (ref 8.9–10.3)
Chloride: 107 mmol/L (ref 98–111)
Creatinine, Ser: 0.79 mg/dL (ref 0.61–1.24)
GFR, Estimated: >60 mL/min (ref >60)
Glucose, Bld: 103 mg/dL — ABNORMAL HIGH (ref 70–99)
Potassium: 3.5 mmol/L (ref 3.5–5.1)
Sodium: 144 mmol/L (ref 135–145)
Total Bilirubin: 0.8 mg/dL (ref 0.3–1.2)
Total Protein: 5.2 g/dL — ABNORMAL LOW (ref 6.5–8.1)

## 2021-09-08 MED ORDER — CHOLESTYRAMINE 4 G PO PACK: 4.0000 g | PACK | Freq: Three times a day (TID) | ORAL | 12 refills | Status: AC

## 2021-09-08 MED ORDER — NITROFURANTOIN MONOHYD MACRO 100 MG PO CAPS: 100.0000 mg | ORAL_CAPSULE | Freq: Two times a day (BID) | ORAL | 0 refills | Status: AC

## 2021-09-08 NOTE — TOC Transition Note (Signed)
Transition of Care [facility]) - CM/SW Discharge Note   Patient Details  Name: Laron Boorman MRN: [id number] Date of Birth: 08/07/1933  Transition of Care [facility]) CM/SW Contact:  Lawerance Sabal, RN Phone Number: 09/08/2021, 11:15 AM   Clinical Narrative:    Notified [facility] of DC. No other TOC needs identified.       Barriers to Discharge: Continued Medical Work up   Patient Goals and CMS Choice Patient states their goals for this hospitalization and ongoing recovery are:: return home CMS Medicare.gov Compare Post Acute Care list provided to:: Patient Represenative (must comment) Choice offered to / list presented to : Spouse  Discharge Placement                       Discharge Plan and Services   Discharge Planning Services: CM Consult Post Acute Care Choice: Home Health                    HH Arranged: PT, OT [facility] Agency: [facility] Date [facility] Agency Contacted: 09/06/21 Time HH Agency Contacted: [phone] Representative spoke with at [facility] Agency: [place]  Social Determinants of Health (SDOH) Interventions     Readmission Risk Interventions     No data to display

## 2021-09-08 NOTE — Discharge Summary (Signed)
Physician Discharge Summary  Calvin Brown [id number] DOB: Jan 14, 1934 DOA: 09/01/2021  PCP: Sueanne Margarita, DO  Admit date: 09/01/2021 Discharge date: 09/08/2021  Admitted From: Home Disposition: Home  Recommendations for Outpatient Follow-up:  Follow up with PCP in 1-2 weeks Follow-up with cardiology next 1 to 2 weeks  Home Health: Home health PT Equipment/Devices: None  Discharge Condition: Stable CODE STATUS: Full Diet recommendation: Low-salt low-fat diet  Brief/Interim Summary: 88 year old with PMH of CAD s/p CABG, IPF recently started on pirfenidone, atrial fibrillation on xarelto who is a retired Doctor, general practice [place] after being found unresponsive by wife this morning, LKN 8/5 evening.  Initially hypotensive with EMS, given fluids, placed on nonrebreather .  Intubated for airway protection due to low GCS, hypothermic, started on Levophed and broad-spectrum antibiotics.  Patient admitted as above with acute metabolic encephalopathy in the setting of septic shock secondary to presumed UTI with biventricular heart failure NSTEMI and respiratory failure.  Patient initially required pressors in the setting of septic shock, responded well to antibiotics and IV fluids.  Patient's blood pressure initially stable but now uptrending back to his baseline, as such discussed with cardiology and will reinitiate patient's Entresto as well as torsemide at discharge.  We also discussed initiating metoprolol but per discussion with patient and wife he is had hypotensive and bradycardic episodes with beta-blockers in the past and prefer to follow-up outpatient before starting this medication.  Otherwise medication changes noted as below.  Discontinuation of statin also will need to be followed up closely by cardiology given his elevated liver enzymes again in the setting of shock.  Labs are normalizing but given his recent profound illness will likely slowly add back on his core measures and medications  as labs and vitals improve over the next few weeks.  Patient otherwise stable and agreeable for discharge home, continue to follow with PT OT in the outpatient setting per PT recommendations here.   Significant events 7/30 fall  8/3 ED visit 8/6 admitted for unresponsiveness, CVC placed 8/7 troponins elevated with t wave inversion, cards consulted type 2 NSTEMI 8/8 extubated 8/9 levophed discontinued    Assessment & Plan: Acute metabolic encephalopathy-multifactorial, resolved Multifactorial in the setting of septic shock/UTI    Septic shock, resolved Secondary to presumed UTI Urine culture grew staph epi and respiratory culture with few gram + cocci in pairs.  Completed vancomycin/zosyn -transition to Macrobid at discharge for remainder of course   Heart failure, biventricular NSTEMI CAD history s/p CABG A-fib Cardiology following - Prior history of CAD s/p CABG 1992 (LIMA to LAD and SVG to OM). Nuclear stress test in April 2022 showed inferior and lateral fixed defects with possibility of peri-infarct ischemia at that time he refused invasive angiography.  - Echo 2022 with EF 55%. Cardiology consulted and think that troponin elevation likely from type 2 NSTEMI. -Continue Xarelto, Entresto, torsemide; family requested not to start metoprolol as discussed with cardiology   Ambulatory dysfunction, acute on chronic  -Continue PT OT outpatient as scheduled   Chronic hypercarbic respiratory failure Underlying IPF , PFTs have not shown significant airway obstruction -Pirfenidone discontinued.  -Will have patient follow-up with Dr. Loanne Drilling as outpatient.    Elevated LFTs-improving DC pirfendione Labs generally improving but not yet back to baseline, hold statin until repeat labs outpatient with PCP or cardiology  Discharge Diagnoses:  Principal Problem:   Encephalopathy Active Problems:   Malnutrition of moderate degree   Septic shock ([place])   Acute respiratory failure  ([place])  Discharge Instructions   Allergies as of 09/08/2021   No Known Allergies      Medication List     STOP taking these medications    acetaminophen 325 MG tablet Commonly known as: TYLENOL   AMBULATORY NON FORMULARY MEDICATION   ezetimibe 10 MG tablet Commonly known as: ZETIA   famotidine 20 MG tablet Commonly known as: PEPCID   linaclotide 145 MCG Caps capsule Commonly known as: Linzess   lisinopril 20 MG tablet Commonly known as: ZESTRIL   Melatonin 10 MG Tabs   Pirfenidone 267 MG Tabs Commonly known as: Esbriet   rosuvastatin 20 MG tablet Commonly known as: CRESTOR   zolpidem 5 MG tablet Commonly known as: AMBIEN       TAKE these medications    cholestyramine 4 g packet Commonly known as: QUESTRAN Take 1 packet (4 g total) by mouth 3 (three) times daily.   Entresto 49-51 MG Generic drug: sacubitril-valsartan Take 0.5 tablets by mouth 2 (two) times daily.   fluticasone furoate-vilanterol 100-25 MCG/ACT Aepb Commonly known as: Breo Ellipta Inhale 1 puff into the lungs daily.   ibandronate 150 MG tablet Commonly known as: BONIVA Take 150 mg by mouth every 30 (thirty) days.   multivitamin with minerals Tabs tablet Take 1 tablet by mouth in the morning.   nitrofurantoin (macrocrystal-monohydrate) 100 MG capsule Commonly known as: MACROBID Take 1 capsule (100 mg total) by mouth every 12 (twelve) hours for 4 days.   omeprazole 20 MG capsule Commonly known as: PRILOSEC Take 1 capsule (20 mg total) by mouth daily.   Potassium 99 MG Tabs Take 198 mg by mouth in the morning.   rivaroxaban 20 MG Tabs tablet Commonly known as: XARELTO Take 1 tablet (20 mg total) by mouth daily with supper.   tamsulosin 0.4 MG Caps capsule Commonly known as: FLOMAX Take 2 capsules (0.8 mg total) by mouth daily. What changed:  how much to take when to take this   torsemide 5 MG tablet Commonly known as: DEMADEX Take 1 tablet (5 mg total) by mouth  in the morning.   Vitamin D 125 MCG (5000 UT) Caps Take 5,000 Units by mouth in the morning.        No Known Allergies  Consultations: Cardiology, PCCM, neurology   Procedures/Studies: VAS [place] LOWER EXTREMITY VENOUS (DVT)  Result Date: 09/03/2021  Lower Venous DVT Study Patient Name:  JAHKYE APA  Date of Exam:   09/03/2021 Medical Rec #: [id number]    Accession #:    [id number] Date of Birth: [birth year]    Patient Gender: M Patient Age:   55 years Exam [facility]  [facility] Procedure:      VAS [place] LOWER EXTREMITY VENOUS (DVT) Referring Phys: [facility] ALVA --------------------------------------------------------------------------------  Indications: Swelling, Edema, and suspected pe.  Comparison Study: no prior Performing Technologist: Archie Patten RVS  Examination Guidelines: A complete evaluation includes B-mode imaging, spectral Doppler, color Doppler, and power Doppler as needed of all accessible portions of each vessel. Bilateral testing is considered an integral part of a complete examination. Limited examinations for reoccurring indications may be performed as noted. The reflux portion of the exam is performed with the patient in reverse Trendelenburg.  +---------+---------------+---------+-----------+----------+--------------+ RIGHT    CompressibilityPhasicitySpontaneityPropertiesThrombus Aging +---------+---------------+---------+-----------+----------+--------------+ CFV      Full           Yes      Yes                                 +---------+---------------+---------+-----------+----------+--------------+  SFJ      Full                                                        +---------+---------------+---------+-----------+----------+--------------+ FV Prox  Full                                                        +---------+---------------+---------+-----------+----------+--------------+ FV Mid   Full                                                         +---------+---------------+---------+-----------+----------+--------------+ FV DistalFull                                                        +---------+---------------+---------+-----------+----------+--------------+ PFV      Full                                                        +---------+---------------+---------+-----------+----------+--------------+ POP      Full           Yes      Yes                                 +---------+---------------+---------+-----------+----------+--------------+ PTV      Full                                                        +---------+---------------+---------+-----------+----------+--------------+ PERO     Full                                                        +---------+---------------+---------+-----------+----------+--------------+   +---------+---------------+---------+-----------+----------+--------------+ LEFT     CompressibilityPhasicitySpontaneityPropertiesThrombus Aging +---------+---------------+---------+-----------+----------+--------------+ CFV      Full           Yes      Yes                                 +---------+---------------+---------+-----------+----------+--------------+ SFJ      Full                                                        +---------+---------------+---------+-----------+----------+--------------+  FV Prox  Full                                                        +---------+---------------+---------+-----------+----------+--------------+ FV Mid   Full                                                        +---------+---------------+---------+-----------+----------+--------------+ FV DistalFull                                                        +---------+---------------+---------+-----------+----------+--------------+ PFV      Full                                                         +---------+---------------+---------+-----------+----------+--------------+ POP      Full           Yes      Yes                                 +---------+---------------+---------+-----------+----------+--------------+ PTV      Full                                                        +---------+---------------+---------+-----------+----------+--------------+ PERO     Full                                                        +---------+---------------+---------+-----------+----------+--------------+     Summary: BILATERAL: - No evidence of deep vein thrombosis seen in the lower extremities, bilaterally. -No evidence of popliteal cyst, bilaterally.   *See table(s) above for measurements and observations. Electronically signed by Deitra Mayo MD on 09/03/2021 at 7:17:53 PM.    Final    CT Angio Chest Pulmonary Embolism (PE) W or WO Contrast  Result Date: 09/02/2021 CLINICAL DATA:  Clinical suspicion of pulmonary embolism EXAM: CT ANGIOGRAPHY CHEST WITH CONTRAST TECHNIQUE: Multidetector CT imaging of the chest was performed using the standard protocol during bolus administration of intravenous contrast. Multiplanar CT image reconstructions and MIPs were obtained to evaluate the vascular anatomy. RADIATION DOSE REDUCTION: This exam was performed according to the departmental dose-optimization program which includes automated exposure control, adjustment of the mA and/or kV according to patient size and/or use of iterative reconstruction technique. CONTRAST:  8mL OMNIPAQUE IOHEXOL 350 MG/ML SOLN COMPARISON:  CT done on 05/28/2020 and chest radiograph done on 09/01/2021 FINDINGS: Cardiovascular: Contrast density in thoracic aorta is less  than adequate to evaluate the lumen. There is ectasia of the ascending thoracic aorta measuring 4.2 cm. Left side of the aortic arch measures 3.9 cm. There are no intraluminal filling defects seen pulmonary artery branches. In image 70 of series 6,  there is possible small linear low-density in a segmental branch in the right lower lobe. Evaluation of small subsegmental branches in the lower lobes is limited by infiltrates. RV- LV ratio is less than 1. coronary artery calcifications are seen. There is previous coronary bypass surgery. Mediastinum/Nodes: No significant lymphadenopathy is seen. Tip of endotracheal tube is at the level of aortic arch. Lungs/Pleura: Moderate to large infiltrates are seen in both lower lobes, more so on the right side with air bronchograms suggesting atelectasis/pneumonia. Small scattered ground-glass and nodular infiltrates are seen in right upper lobe. Small patchy ground-glass infiltrate is seen in left upper lobe and left parahilar region. Small bilateral pleural effusions are seen. There is no pneumothorax. Upper Abdomen: Distal portion of NG tube is seen in the stomach. There is 3 mm calcific density in the upper pole of left kidney. Minimal ascites is present. Musculoskeletal: There is slight decrease in height of few of thoracic vertebral bodies, particularly in T3 and T5 vertebrae. Alignment of posterior margin of vertebral bodies is unremarkable. Review of the MIP images confirms the above findings. IMPRESSION: There is no evidence of central pulmonary artery embolism. There is small linear low-density in his segmental branch and right lower lobe, possibly suggesting PE with very small thrombus burden. If clinically warranted, please consider venous Doppler examination of both lower extremities. There are no signs of acute right ventricular strain. There are moderate to large infiltrates in both lower lobes, more so on the right side suggesting atelectasis/pneumonia. There are small patchy ground-glass and reticulonodular infiltrates in both upper lobes, more so on the right side suggesting possible multifocal pneumonia. Small bilateral pleural effusions. Severe coronary artery disease. There is ectasia of the ascending  thoracic aorta measuring 4.2 cm. There is ectasia of left side of aortic arch measuring 3.9 cm. Recommend annual imaging followup by CTA or MRA. This recommendation follows 2010 ACCF/AHA/AATS/ACR/ASA/SCA/SCAI/SIR/STS/SVM Guidelines for the Diagnosis and Management of Patients with Thoracic Aortic Disease. Circulation.2010; 121ML:4928372. Aortic aneurysm NOS (ICD10-I71.9) There is mild decrease in height of the bodies of T3 and T5 vertebrae which may suggest recent or old compression fractures. Possible 3 mm left renal calculus. Minimal ascites. Electronically Signed   By: Elmer Picker M.D.   On: 09/02/2021 15:40   ECHOCARDIOGRAM COMPLETE  Result Date: 09/02/2021    ECHOCARDIOGRAM REPORT   Patient Name:   Saimon Rothbard Date of Exam: 09/02/2021 Medical Rec #:  [id number]   Height:       71.0 in Accession #:    [id number]  Weight:       181.4 lb Date of Birth:  January 21, 1934   BSA:          2.023 m Patient Age:    21 years    BP:           83/54 mmHg Patient Gender: M           HR:           98 bpm. Exam Location:  Inpatient Procedure: 2D Echo, Cardiac Doppler, Color Doppler and Intracardiac            Opacification Agent Indications:    Acute ischemic heart disease, unspecified I24.9  History:  Patient has prior history of Echocardiogram examinations, most                 recent 01/29/2021. CAD, Prior CABG, Stroke, Arrythmias:Atrial                 Fibrillation; Risk Factors:Hypertension and Dyslipidemia.  Sonographer:    Milda Smart Referring Phys: Jovita Kussmaul, Judie Petit  Sonographer Comments: Echo performed with patient supine and on artificial respirator. Image acquisition challenging due to respiratory motion. ICU patient. IMPRESSIONS  1. Left ventricular ejection fraction, by estimation, is 45 to 50%. The left ventricle has mildly decreased function. The left ventricle demonstrates global hypokinesis. There is mild left ventricular hypertrophy of the basal-septal segment. Left ventricular diastolic  parameters are indeterminate. There is the interventricular septum is flattened in systole and diastole, consistent with right ventricular pressure and volume overload.  2. Right ventricular systolic function is severely reduced. The right ventricular size is mildly enlarged.  3. Left atrial size was moderately dilated.  4. Right atrial size was moderately dilated.  5. The mitral valve is normal in structure. Mild mitral valve regurgitation. No evidence of mitral stenosis.  6. Tricuspid valve regurgitation is mild to moderate.  7. The aortic valve is tricuspid. Aortic valve regurgitation is mild to moderate. No aortic stenosis is present.  8. Aortic dilatation noted. There is mild dilatation of the ascending aorta, measuring 40 mm.  9. The inferior vena cava is normal in size with greater than 50% respiratory variability, suggesting right atrial pressure of 3 mmHg. Comparison(s): Prior images reviewed side by side. FINDINGS  Left Ventricle: Left ventricular ejection fraction, by estimation, is 45 to 50%. The left ventricle has mildly decreased function. The left ventricle demonstrates global hypokinesis. Definity contrast agent was given IV to delineate the left ventricular  endocardial borders. The left ventricular internal cavity size was normal in size. There is mild left ventricular hypertrophy of the basal-septal segment. The interventricular septum is flattened in systole and diastole, consistent with right ventricular pressure and volume overload. Left ventricular diastolic parameters are indeterminate. Right Ventricle: The right ventricular size is mildly enlarged. No increase in right ventricular wall thickness. Right ventricular systolic function is severely reduced. Left Atrium: Left atrial size was moderately dilated. Right Atrium: Right atrial size was moderately dilated. Pericardium: There is no evidence of pericardial effusion. Mitral Valve: The mitral valve is normal in structure. Mild mitral valve  regurgitation. No evidence of mitral valve stenosis. Tricuspid Valve: The tricuspid valve is normal in structure. Tricuspid valve regurgitation is mild to moderate. No evidence of tricuspid stenosis. Aortic Valve: The aortic valve is tricuspid. Aortic valve regurgitation is mild to moderate. No aortic stenosis is present. Pulmonic Valve: The pulmonic valve was normal in structure. Pulmonic valve regurgitation is mild. No evidence of pulmonic stenosis. Aorta: Aortic dilatation noted. There is mild dilatation of the ascending aorta, measuring 40 mm. Venous: The inferior vena cava is normal in size with greater than 50% respiratory variability, suggesting right atrial pressure of 3 mmHg. IAS/Shunts: No atrial level shunt detected by color flow Doppler.  LEFT VENTRICLE PLAX 2D LVIDd:         4.40 cm   Diastology LVIDs:         3.70 cm   LV e' medial:    9.37 cm/s LV PW:         0.90 cm   LV E/e' medial:  4.4 LV IVS:        1.40 cm  LV e' lateral:   16.90 cm/s LVOT diam:     2.30 cm   LV E/e' lateral: 2.4 LV SV:         49 LV SV Index:   24 LVOT Area:     4.15 cm  RIGHT VENTRICLE RV S prime:     11.10 cm/s TAPSE (M-mode): 0.5 cm LEFT ATRIUM             Index        RIGHT ATRIUM           Index LA diam:        4.90 cm 2.42 cm/m   RA Area:     22.30 cm LA Vol (A2C):   62.6 ml 30.94 ml/m  RA Volume:   60.00 ml  29.66 ml/m LA Vol (A4C):   90.2 ml 44.59 ml/m LA Biplane Vol: 75.1 ml 37.12 ml/m  AORTIC VALVE             PULMONIC VALVE LVOT Vmax:   75.00 cm/s  PR End Diast Vel: 9.49 msec LVOT Vmean:  53.100 cm/s LVOT VTI:    0.117 m  AORTA Ao Root diam: 3.10 cm Ao Asc diam:  4.00 cm MITRAL VALVE               TRICUSPID VALVE MV Area (PHT): 5.20 cm    TR Peak grad:   21.3 mmHg MV Decel Time: 146 msec    TR Vmax:        231.00 cm/s MV E velocity: 41.40 cm/s                            SHUNTS                            Systemic VTI:  0.12 m                            Systemic Diam: 2.30 cm Candee Furbish MD Electronically  signed by Candee Furbish MD Signature Date/Time: 09/02/2021/11:26:59 AM    Final    EEG adult  Result Date: 09/01/2021 Greta Doom, MD     09/01/2021  5:21 PM History: 88 yo M being evaluated for acute encephalopathy Sedation: fentanyl Technique: This EEG was acquired with electrodes placed according to the International 10-20 electrode system (including Fp1, Fp2, F3, F4, C3, C4, P3, P4, O1, O2, T3, T4, T5, T6, A1, A2, Fz, Cz, Pz). The following electrodes were missing or displaced: none. Background: The background consists of intermixed irregular delta and theta range activities. There are occiasional generalized discharges with triphasic morphology without evidence of evolution or other concerning features. No definite epileptiform discharges were seen. Posterior dominant rhythm was not visualized. Photic stimulation: Physiologic driving is not performed. EEG Abnormalities: 1) Triphasic waves 2) Generalized irregular slow activity 3) Absent PDR Clinical Interpretation: This EEG is consistent with a generalized non-specific cerebral dysfunction(encephalopathy). There was no seizure or seizure predisposition recorded on this study. Please note that lack of epileptiform activity on EEG does not preclude the possibility of epilepsy. Roland Rack, MD [facility] [phone] If 7pm- 7am, please page neurology on call as listed in [place].   DG Chest Port 1 View  Result Date: 09/01/2021 CLINICAL DATA:  Central line placement. EXAM: PORTABLE CHEST 1 VIEW COMPARISON:  09/01/2021 at 11:33 a.m. FINDINGS: New right internal  jugular central venous line has its tip projecting in the upper aspect of the right atrium. Endotracheal tube and nasal/orogastric tube are stable. Elevated right hemidiaphragm. Mild lung base opacities consistent with atelectasis. Remainder of the lungs is clear. No pneumothorax. IMPRESSION: 1. Right internal jugular central venous line catheter tip projects near the caval  atrial junction, likely in the superior aspect of the right atrium. 2. No pneumothorax. 3. No other change from the earlier study. Electronically Signed   By: Lajean Manes M.D.   On: 09/01/2021 16:16   CT ABDOMEN PELVIS WO CONTRAST  Result Date: 09/01/2021 CLINICAL DATA:  Hepatitis, unwitnessed fall EXAM: CT ABDOMEN AND PELVIS WITHOUT CONTRAST TECHNIQUE: Multidetector CT imaging of the abdomen and pelvis was performed following the standard protocol without IV contrast. RADIATION DOSE REDUCTION: This exam was performed according to the departmental dose-optimization program which includes automated exposure control, adjustment of the mA and/or kV according to patient size and/or use of iterative reconstruction technique. COMPARISON:  06/05/2021 FINDINGS: Lower chest: Small bilateral pleural effusions and associated atelectasis or consolidation. Cardiomegaly. Three-vessel coronary artery calcifications Hepatobiliary: No solid liver abnormality is seen. No gallstones, gallbladder wall thickening, or biliary dilatation. Pancreas: Diffuse peripancreatic fat stranding and fluid. No evident pancreatic ductal dilatation or acute pancreatic fluid collection. Spleen: Normal in size without significant abnormality. Adrenals/Urinary Tract: Adrenal glands are unremarkable. Small nonobstructive left renal calculi. Mildly patulous appearance of the left renal pelvis, unchanged compared to prior. Simple, benign right renal cyst, for which no further follow-up or characterization is required. Bilateral perinephric fat stranding, increased compared to prior examination. Foley catheter in the bladder. Stomach/Bowel: Esophagogastric tube with tip and side port below the diaphragm, tip in the gastric fundus. Stomach is within normal limits. Appendix is not clearly visualized. No evidence of bowel wall thickening, distention, or inflammatory changes. Vascular/Lymphatic: Aortic atherosclerosis. No enlarged abdominal or pelvic lymph  nodes. Reproductive: No mass or other significant abnormality. Other: No abdominal wall hernia or abnormality. Small volume perihepatic and perisplenic ascites. Musculoskeletal: No acute or significant osseous findings. IMPRESSION: 1. Diffuse peripancreatic fat stranding and fluid, consistent with acute pancreatitis. No evident pancreatic ductal dilatation or acute pancreatic fluid collection on noncontrast examination. 2. Small volume ascites. 3. Small bilateral pleural effusions and associated atelectasis or consolidation. 4. Small nonobstructive left renal calculi. Mildly patulous appearance of the left renal pelvis, unchanged compared to prior examination. No ureteral calculi or overt hydronephrosis. 5. Bilateral perinephric fat stranding, increased compared to prior examination possibly reactive to adjacent pancreatitis. Correlate with urinalysis to exclude urinary tract infection. 6. Coronary artery disease. Aortic Atherosclerosis (ICD10-I70.0). Electronically Signed   By: Delanna Ahmadi M.D.   On: 09/01/2021 13:16   CT Head Wo Contrast  Result Date: 09/01/2021 CLINICAL DATA:  Head trauma.  Unwitnessed fall.  On Xarelto. EXAM: CT HEAD WITHOUT CONTRAST TECHNIQUE: Contiguous axial images were obtained from the base of the skull through the vertex without intravenous contrast. RADIATION DOSE REDUCTION: This exam was performed according to the departmental dose-optimization program which includes automated exposure control, adjustment of the mA and/or kV according to patient size and/or use of iterative reconstruction technique. COMPARISON:  Head CT 08/29/2021 FINDINGS: Brain: There is no evidence of an acute infarct, acute intracranial hemorrhage, mass, or midline shift. At most trace residual low-density subdural fluid remains over the left frontal convexity. There is a moderate-sized chronic right MCA infarct involving the anterior insula and frontal operculum with ex vacuo dilatation of the frontal horn of  the right  lateral ventricle. Hypodensities elsewhere in the cerebral white matter bilaterally are unchanged and nonspecific but compatible with mild chronic small vessel ischemic disease. Vascular: Calcified atherosclerosis at the skull base. Prior left middle meningeal artery embolization. Skull: No fracture or suspicious osseous lesion. Sinuses/Orbits: Mild mucosal thickening in the ethmoid sinuses. Clear mastoid air cells. Bilateral cataract extraction. Other: None. IMPRESSION: 1. No evidence of acute intracranial abnormality. 2. Chronic findings as above. Electronically Signed   By: Sebastian Ache M.D.   On: 09/01/2021 13:14   DG Chest Portable 1 View  Result Date: 09/01/2021 CLINICAL DATA:  Status post intubation. EXAM: PORTABLE CHEST 1 VIEW COMPARISON:  08/29/2021 FINDINGS: 1133 hours. Endotracheal tube tip is 5.6 cm above the base of the carina. NG tube is looped in the stomach with tip overlying the gastric cardia. No edema or pneumothorax. No substantial right pleural effusion. There is some basilar atelectasis bilaterally with possible tiny left effusion. Telemetry leads overlie the chest. IMPRESSION: 1. Endotracheal tube tip 5.6 cm above the carina. 2. NG tube tip is in the stomach. Electronically Signed   By: Kennith Center M.D.   On: 09/01/2021 12:02   CT Head Wo Contrast  Result Date: 08/29/2021 CLINICAL DATA:  Head trauma, minor (Age >= 65y); Neck trauma (Age >= 65y) EXAM: CT HEAD WITHOUT CONTRAST CT CERVICAL SPINE WITHOUT CONTRAST TECHNIQUE: Multidetector CT imaging of the head and cervical spine was performed following the standard protocol without intravenous contrast. Multiplanar CT image reconstructions of the cervical spine were also generated. RADIATION DOSE REDUCTION: This exam was performed according to the departmental dose-optimization program which includes automated exposure control, adjustment of the mA and/or kV according to patient size and/or use of iterative reconstruction  technique. COMPARISON:  Head CT 07/10/2021, cervical spine CT 04/15/2021. FINDINGS: CT HEAD FINDINGS Brain: There is no evidence of acute intracranial hemorrhage. Unchanged old right frontal lobe infarct.There is no acute extra-axial collection.There is a minimal residual low-density left frontal subdural collection, which is unchanged from prior exam.Unchanged additional mild sequela of chronic small vessel ischemic disease. Vascular: No hyperdense vessel. Previous left-sided middle meningeal artery embolization. Skull: Negative for skull fracture. Sinuses/Orbits: Mild ethmoid air cell mucosal thickening. Mastoid air cells are clear. Orbits are unremarkable. Other: None. CT CERVICAL SPINE FINDINGS Motion degraded exam. Alignment: Unchanged degenerative grade 1 anterolisthesis at C4-C5. Skull base and vertebrae: There is no acute cervical spine fracture. There is no aggressive osseous lesion. Soft tissues and spinal canal: No prevertebral fluid or swelling. No visible canal hematoma. Disc levels: There is moderate to severe degenerative disc disease at C4-C5 and C5-C6. There is moderate severe multilevel facet arthropathy, worse on the left. Upper chest: Negative. Other: None. IMPRESSION: Minimal residual density left frontal subdural collection consistent with chronic hygroma, unchanged from prior exam. No acute intracranial abnormality. Unchanged old right frontal lobe infarct. No acute cervical spine fracture. Multi level degenerative disc disease, worst at C4-C5 and C5-C6. Electronically Signed   By: Caprice Renshaw M.D.   On: 08/29/2021 14:19   CT Cervical Spine Wo Contrast  Result Date: 08/29/2021 CLINICAL DATA:  Head trauma, minor (Age >= 65y); Neck trauma (Age >= 65y) EXAM: CT HEAD WITHOUT CONTRAST CT CERVICAL SPINE WITHOUT CONTRAST TECHNIQUE: Multidetector CT imaging of the head and cervical spine was performed following the standard protocol without intravenous contrast. Multiplanar CT image reconstructions  of the cervical spine were also generated. RADIATION DOSE REDUCTION: This exam was performed according to the departmental dose-optimization program which includes automated exposure control,  adjustment of the mA and/or kV according to patient size and/or use of iterative reconstruction technique. COMPARISON:  Head CT 07/10/2021, cervical spine CT 04/15/2021. FINDINGS: CT HEAD FINDINGS Brain: There is no evidence of acute intracranial hemorrhage. Unchanged old right frontal lobe infarct.There is no acute extra-axial collection.There is a minimal residual low-density left frontal subdural collection, which is unchanged from prior exam.Unchanged additional mild sequela of chronic small vessel ischemic disease. Vascular: No hyperdense vessel. Previous left-sided middle meningeal artery embolization. Skull: Negative for skull fracture. Sinuses/Orbits: Mild ethmoid air cell mucosal thickening. Mastoid air cells are clear. Orbits are unremarkable. Other: None. CT CERVICAL SPINE FINDINGS Motion degraded exam. Alignment: Unchanged degenerative grade 1 anterolisthesis at C4-C5. Skull base and vertebrae: There is no acute cervical spine fracture. There is no aggressive osseous lesion. Soft tissues and spinal canal: No prevertebral fluid or swelling. No visible canal hematoma. Disc levels: There is moderate to severe degenerative disc disease at C4-C5 and C5-C6. There is moderate severe multilevel facet arthropathy, worse on the left. Upper chest: Negative. Other: None. IMPRESSION: Minimal residual density left frontal subdural collection consistent with chronic hygroma, unchanged from prior exam. No acute intracranial abnormality. Unchanged old right frontal lobe infarct. No acute cervical spine fracture. Multi level degenerative disc disease, worst at C4-C5 and C5-C6. Electronically Signed   By: Maurine Simmering M.D.   On: 08/29/2021 14:19   DG Chest 2 View  Result Date: 08/29/2021 CLINICAL DATA:  Chest pain after fall. EXAM:  CHEST - 2 VIEW COMPARISON:  April 21, 2021. FINDINGS: The heart size and mediastinal contours are within normal limits. Status post coronary bypass graft. Minimal bibasilar subsegmental atelectasis is noted. The visualized skeletal structures are unremarkable. IMPRESSION: Minimal bibasilar subsegmental atelectasis. Electronically Signed   By: Marijo Conception M.D.   On: 08/29/2021 13:45     Subjective: No acute issues or events overnight denies nausea vomiting diarrhea constipation headache fevers chills or chest pain   Discharge Exam: Vitals:   09/08/21 0759 09/08/21 0813  BP:  (!) 154/86  Pulse:  96  Resp:  20  Temp:  97.6 F (36.4 C)  SpO2: 96% 92%   Vitals:   09/08/21 0400 09/08/21 0442 09/08/21 0759 09/08/21 0813  BP: (!) 152/90   (!) 154/86  Pulse: 94   96  Resp:    20  Temp: 98 F (36.7 C)   97.6 F (36.4 C)  TempSrc: Oral   Oral  SpO2: 94%  96% 92%  Weight:  84.2 kg      General: Pt is alert, awake, not in acute distress Cardiovascular: RRR, S1/S2 +, no rubs, no gallops Respiratory: CTA bilaterally, no wheezing, no rhonchi Abdominal: Soft, NT, ND, bowel sounds + Extremities: no edema, no cyanosis    The results of significant diagnostics from this hospitalization (including imaging, microbiology, ancillary and laboratory) are listed below for reference.     Microbiology: Recent Results (from the past 240 hour(s))  Urine Culture     Status: Abnormal   Collection Time: 09/01/21 11:20 AM   Specimen: Urine, Catheterized  Result Value Ref Range Status   Specimen Description URINE, CATHETERIZED  Final   Special Requests   Final    Normal Performed at [facility], [address], [place], [place] [address]    Culture >=100,000 COLONIES/mL STAPHYLOCOCCUS EPIDERMIDIS (A)  Final   Report Status 09/03/2021 FINAL  Final   Organism ID, Bacteria STAPHYLOCOCCUS EPIDERMIDIS (A)  Final      Susceptibility  Staphylococcus epidermidis - MIC*    CIPROFLOXACIN <=0.5  SENSITIVE Sensitive     GENTAMICIN <=0.5 SENSITIVE Sensitive     NITROFURANTOIN <=16 SENSITIVE Sensitive     OXACILLIN >=4 RESISTANT Resistant     TETRACYCLINE >=16 RESISTANT Resistant     VANCOMYCIN 1 SENSITIVE Sensitive     TRIMETH/SULFA 160 RESISTANT Resistant     CLINDAMYCIN >=8 RESISTANT Resistant     RIFAMPIN <=0.5 SENSITIVE Sensitive     Inducible Clindamycin NEGATIVE Sensitive     * >=100,000 COLONIES/mL STAPHYLOCOCCUS EPIDERMIDIS  Blood culture (routine x 2)     Status: None   Collection Time: 09/01/21 12:15 PM   Specimen: BLOOD  Result Value Ref Range Status   Specimen Description BLOOD RIGHT ANTECUBITAL  Final   Special Requests   Final    BOTTLES DRAWN AEROBIC AND ANAEROBIC Blood Culture results may not be optimal due to an excessive volume of blood received in culture bottles   Culture   Final    NO GROWTH 5 DAYS Performed at [facility], [address], [place], [place] [address]    Report Status 09/06/2021 FINAL  Final  Blood culture (routine x 2)     Status: None   Collection Time: 09/01/21 12:20 PM   Specimen: BLOOD  Result Value Ref Range Status   Specimen Description BLOOD LEFT ANTECUBITAL  Final   Special Requests   Final    BOTTLES DRAWN AEROBIC AND ANAEROBIC Blood Culture adequate volume   Culture   Final    NO GROWTH 5 DAYS Performed at [facility], [address] [place], [place] [address]    Report Status 09/06/2021 FINAL  Final  MRSA Next Gen by PCR, Nasal     Status: None   Collection Time: 09/01/21  3:45 PM   Specimen: Nasal Mucosa; Nasal Swab  Result Value Ref Range Status   MRSA by PCR Next Gen NOT DETECTED NOT DETECTED Final    Comment: (NOTE) The GeneXpert MRSA Assay (FDA approved for NASAL specimens only), is one component of a comprehensive MRSA colonization surveillance program. It is not intended to diagnose MRSA infection nor to guide or monitor treatment for MRSA infections. Test performance is not FDA approved in  patients less than 52 years old. Performed at [facility], [address], [place], [place] [address]   Culture, Respiratory w Gram Stain     Status: None   Collection Time: 09/03/21  4:05 AM   Specimen: Tracheal Aspirate; Respiratory  Result Value Ref Range Status   Specimen Description TRACHEAL ASPIRATE  Final   Special Requests NONE  Final   Gram Stain   Final    MODERATE WBC PRESENT,BOTH PMN AND MONONUCLEAR FEW GRAM POSITIVE COCCI IN PAIRS    Culture   Final    FEW Normal respiratory flora-no Staph aureus or Pseudomonas seen Performed at [facility], [address] [place], [place] [address]    Report Status 09/05/2021 FINAL  Final     Labs: BNP (last 3 results) Recent Labs    01/29/21 0932 04/15/21 1639  BNP 162.7* 224.3*   Basic Metabolic Panel: Recent Labs  Lab 09/01/21 1722 09/01/21 2200 09/02/21 0412 09/03/21 0421 09/04/21 0349 09/04/21 1445 09/05/21 0748 09/06/21 0450 09/08/21 0548  NA  --    < > 140 141 140 142 141 140 144  K  --    < > 3.9 3.7 3.5 3.8 3.5 3.6 3.5  CL  --    < >  111 112* 112*  --  111 110 107  CO2  --    < > 23 24 23   --  24 26 28   GLUCOSE  --    < > 107* 127* 103*  --  106* 100* 103*  BUN  --    < > 33* 26* 24*  --  19 19 19   CREATININE  --    < > 1.25* 0.90 0.95  --  0.92 0.75 0.79  CALCIUM  --    < > 7.6* 7.7* 7.8*  --  8.2* 8.3* 8.4*  MG 1.9  --  2.3 2.0  --   --   --   --   --   PHOS 4.4  --  2.9 2.5  --   --   --   --   --    < > = values in this interval not displayed.   Liver Function Tests: Recent Labs  Lab 09/03/21 0421 09/04/21 0349 09/05/21 0748 09/06/21 0450 09/08/21 0548  AST 213* 82* 43* 26 20  ALT 709* 480* 300* 211* 129*  ALKPHOS 50 50 53 46 52  BILITOT 0.6 0.8 0.9 0.8 0.8  PROT 5.0* 4.9* 5.1* 4.9* 5.2*  ALBUMIN 2.3* 2.3* 2.2* 2.2* 2.4*   Recent Labs  Lab 09/01/21 2047  LIPASE 27   No results for input(s): "AMMONIA" in the last 168 hours.  CBC: Recent Labs  Lab 09/03/21 0421  09/04/21 0349 09/04/21 1445 09/05/21 0748 09/06/21 0450 09/08/21 0548  WBC 13.3* 12.2*  --  8.1 5.2 6.5  HGB 11.2* 10.1* 9.9* 11.3* 10.2* 10.2*  HCT 35.7* 31.8* 29.0* 36.6* 32.4* 32.5*  MCV 87.1 87.4  --  89.1 87.3 86.9  PLT 146* 136*  --  119* 110* 138*   Cardiac Enzymes: No results for input(s): "CKTOTAL", "CKMB", "CKMBINDEX", "TROPONINI" in the last 168 hours. BNP: Invalid input(s): "POCBNP" CBG: Recent Labs  Lab 09/04/21 1909 09/04/21 2307 09/05/21 0306 09/05/21 0726 09/05/21 1118  GLUCAP 100* 108* 84 101* 106*   D-Dimer No results for input(s): "DDIMER" in the last 72 hours. Hgb A1c No results for input(s): "HGBA1C" in the last 72 hours. Lipid Profile No results for input(s): "CHOL", "HDL", "LDLCALC", "TRIG", "CHOLHDL", "LDLDIRECT" in the last 72 hours. Thyroid function studies No results for input(s): "TSH", "T4TOTAL", "T3FREE", "THYROIDAB" in the last 72 hours.  Invalid input(s): "FREET3" Anemia work up No results for input(s): "VITAMINB12", "FOLATE", "FERRITIN", "TIBC", "IRON", "RETICCTPCT" in the last 72 hours. Urinalysis    Component Value Date/Time   COLORURINE YELLOW 09/01/2021 1120   APPEARANCEUR TURBID (A) 09/01/2021 1120   LABSPEC 1.020 09/01/2021 1120   PHURINE 6.0 09/01/2021 1120   GLUCOSEU NEGATIVE 09/01/2021 1120   HGBUR MODERATE (A) 09/01/2021 1120   BILIRUBINUR NEGATIVE 09/01/2021 1120   KETONESUR NEGATIVE 09/01/2021 1120   PROTEINUR 100 (A) 09/01/2021 1120   NITRITE NEGATIVE 09/01/2021 1120   LEUKOCYTESUR LARGE (A) 09/01/2021 1120   Sepsis Labs Recent Labs  Lab 09/04/21 0349 09/05/21 0748 09/06/21 0450 09/08/21 0548  WBC 12.2* 8.1 5.2 6.5   Microbiology Recent Results (from the past 240 hour(s))  Urine Culture     Status: Abnormal   Collection Time: 09/01/21 11:20 AM   Specimen: Urine, Catheterized  Result Value Ref Range Status   Specimen Description URINE, CATHETERIZED  Final   Special Requests   Final    Normal Performed  at [facility], [place] [address], [place], [place] [address]  Culture >=100,000 COLONIES/mL STAPHYLOCOCCUS EPIDERMIDIS (A)  Final   Report Status 09/03/2021 FINAL  Final   Organism ID, Bacteria STAPHYLOCOCCUS EPIDERMIDIS (A)  Final      Susceptibility   Staphylococcus epidermidis - MIC*    CIPROFLOXACIN <=0.5 SENSITIVE Sensitive     GENTAMICIN <=0.5 SENSITIVE Sensitive     NITROFURANTOIN <=16 SENSITIVE Sensitive     OXACILLIN >=4 RESISTANT Resistant     TETRACYCLINE >=16 RESISTANT Resistant     VANCOMYCIN 1 SENSITIVE Sensitive     TRIMETH/SULFA 160 RESISTANT Resistant     CLINDAMYCIN >=8 RESISTANT Resistant     RIFAMPIN <=0.5 SENSITIVE Sensitive     Inducible Clindamycin NEGATIVE Sensitive     * >=100,000 COLONIES/mL STAPHYLOCOCCUS EPIDERMIDIS  Blood culture (routine x 2)     Status: None   Collection Time: 09/01/21 12:15 PM   Specimen: BLOOD  Result Value Ref Range Status   Specimen Description BLOOD RIGHT ANTECUBITAL  Final   Special Requests   Final    BOTTLES DRAWN AEROBIC AND ANAEROBIC Blood Culture results may not be optimal due to an excessive volume of blood received in culture bottles   Culture   Final    NO GROWTH 5 DAYS Performed at [facility], [address], [place], [place] [address]    Report Status 09/06/2021 FINAL  Final  Blood culture (routine x 2)     Status: None   Collection Time: 09/01/21 12:20 PM   Specimen: BLOOD  Result Value Ref Range Status   Specimen Description BLOOD LEFT ANTECUBITAL  Final   Special Requests   Final    BOTTLES DRAWN AEROBIC AND ANAEROBIC Blood Culture adequate volume   Culture   Final    NO GROWTH 5 DAYS Performed at [facility], [place] [address], [place], [place] [address]    Report Status 09/06/2021 FINAL  Final  MRSA Next Gen by PCR, Nasal     Status: None   Collection Time: 09/01/21  3:45 PM   Specimen: Nasal Mucosa; Nasal Swab  Result Value Ref Range Status   MRSA by PCR Next Gen NOT DETECTED NOT  DETECTED Final    Comment: (NOTE) The GeneXpert MRSA Assay (FDA approved for NASAL specimens only), is one component of a comprehensive MRSA colonization surveillance program. It is not intended to diagnose MRSA infection nor to guide or monitor treatment for MRSA infections. Test performance is not FDA approved in patients less than 67 years old. Performed at [facility], [place] [address] [place], [place] [address]   Culture, Respiratory w Gram Stain     Status: None   Collection Time: 09/03/21  4:05 AM   Specimen: Tracheal Aspirate; Respiratory  Result Value Ref Range Status   Specimen Description TRACHEAL ASPIRATE  Final   Special Requests NONE  Final   Gram Stain   Final    MODERATE WBC PRESENT,BOTH PMN AND MONONUCLEAR FEW GRAM POSITIVE COCCI IN PAIRS    Culture   Final    FEW Normal respiratory flora-no Staph aureus or Pseudomonas seen Performed at [facility], [address] [place], [place] [address]    Report Status 09/05/2021 FINAL  Final     Time coordinating discharge: Over 30 minutes  SIGNED:   Little Ishikawa, DO [facility] 09/08/2021, 5:16 PM Pager   If 7PM-7AM, please contact night-coverage [email or web address]

## 2021-09-08 NOTE — Discharge Instructions (Signed)

## 2021-09-09 ENCOUNTER — Other Ambulatory Visit: Payer: Self-pay

## 2021-09-09 ENCOUNTER — Ambulatory Visit: Payer: Self-pay | Admitting: *Deleted

## 2021-09-09 MED ORDER — SACUBITRIL-VALSARTAN 24-26 MG PO TABS: 1.0000 | ORAL_TABLET | Freq: Two times a day (BID) | ORAL | 0 refills | Status: AC

## 2021-09-09 MED ORDER — RIVAROXABAN 20 MG PO TABS: 20.0000 mg | ORAL_TABLET | Freq: Every day | ORAL | 3 refills | Status: AC

## 2021-09-09 NOTE — Patient Outreach (Signed)
  Care Coordination   Follow Up Visit Note   09/09/2021 Name: Oluwademilade Mckiver MRN: [id number] DOB: 1934-01-23  Price Lachapelle is a 88 y.o. year old male who sees Charlane Ferretti, [place] for primary care. I spoke with Mrs. Dyar by phone today, pt's spouse.  What matters to the patients health and wellness today?  RECOVERING FROM HOSPITAL STAY AND GAINING STRENGTH BACK TO PREHOSPITAL STAY.    Goals Addressed               This Visit's Progress     Patient Stated     Patient Stated (pt-stated)   Not on track     I will attend all my in home PT sessions and follow strict safety/fall precautions avoiding any falls over the next 30 days.  08/29/21 Pt had fall on Sunday 08/25/21 hitting shoulder and head. Was seen by ortho and now in ED being evaluated for possible TBI due to increased somnolence, weakness. He has continued to go to his therapy.  09/09/21 Pt discharged from hospital and will start home PT this week. Altering goal to include home health PT.        SDOH assessments and interventions completed:  Yes     Care Coordination Interventions Activated:  No  Care Coordination Interventions:  Yes, provided   Follow up plan: Follow up call scheduled for 1 WEEK.    Encounter Outcome:  Pt. Visit Completed   [name], MSN, [facility] Gerontological Nurse Practitioner [facility] Care Management [phone]

## 2021-09-10 ENCOUNTER — Other Ambulatory Visit: Payer: Self-pay

## 2021-09-10 DIAGNOSIS — I5032 Chronic diastolic (congestive) heart failure: Secondary | ICD-10-CM

## 2021-09-12 ENCOUNTER — Encounter: Payer: Self-pay | Admitting: *Deleted

## 2021-09-12 ENCOUNTER — Other Ambulatory Visit (HOSPITAL_COMMUNITY): Payer: Self-pay

## 2021-09-12 ENCOUNTER — Telehealth: Payer: Self-pay | Admitting: Cardiology

## 2021-09-12 NOTE — Telephone Encounter (Signed)
[facility] in [place], [place] called to let us know patient is deceased as of this morning. Caller has a few questions, left voicemail with Devonna. Please advise.

## 2021-09-12 NOTE — Patient Outreach (Signed)
[facility]) Care Management  10-01-2021  Calvin Brown 12/01/1933 [id number]  Notified that pt has passed away this morning. Upcoming visit cancelled.  Zara Council. Burgess Estelle, MSN, [facility] Gerontological Nurse Practitioner [facility] Care Management [phone]

## 2021-09-13 ENCOUNTER — Other Ambulatory Visit (HOSPITAL_COMMUNITY): Payer: Self-pay

## 2021-09-13 NOTE — Telephone Encounter (Signed)
Spoke to patient's wife and offered my deepest condolences for the loss of Dr. Michaell Cowing.  Khristopher Kapaun Knippa, DO, [facility]

## 2021-09-16 ENCOUNTER — Encounter: Payer: Medicare Other | Admitting: *Deleted

## 2021-09-20 ENCOUNTER — Ambulatory Visit: Payer: Medicare Other | Admitting: Cardiology

## 2021-09-27 DEATH — deceased

## 2021-10-01 ENCOUNTER — Inpatient Hospital Stay: Payer: Medicare Other | Admitting: Neurology

## 2021-10-17 ENCOUNTER — Ambulatory Visit: Payer: Medicare Other | Admitting: Pulmonary Disease

## 2021-12-02 ENCOUNTER — Ambulatory Visit: Payer: Medicare Other | Admitting: Cardiology

## 2023-06-01 IMAGING — DX DG CHEST 2V
2 series · 2 of 2 positions shown · non-contrast
Comparison: CT chest 05/28/2020.

CLINICAL DATA: Cough.

EXAM:
CHEST - 2 VIEW

[chest pa]
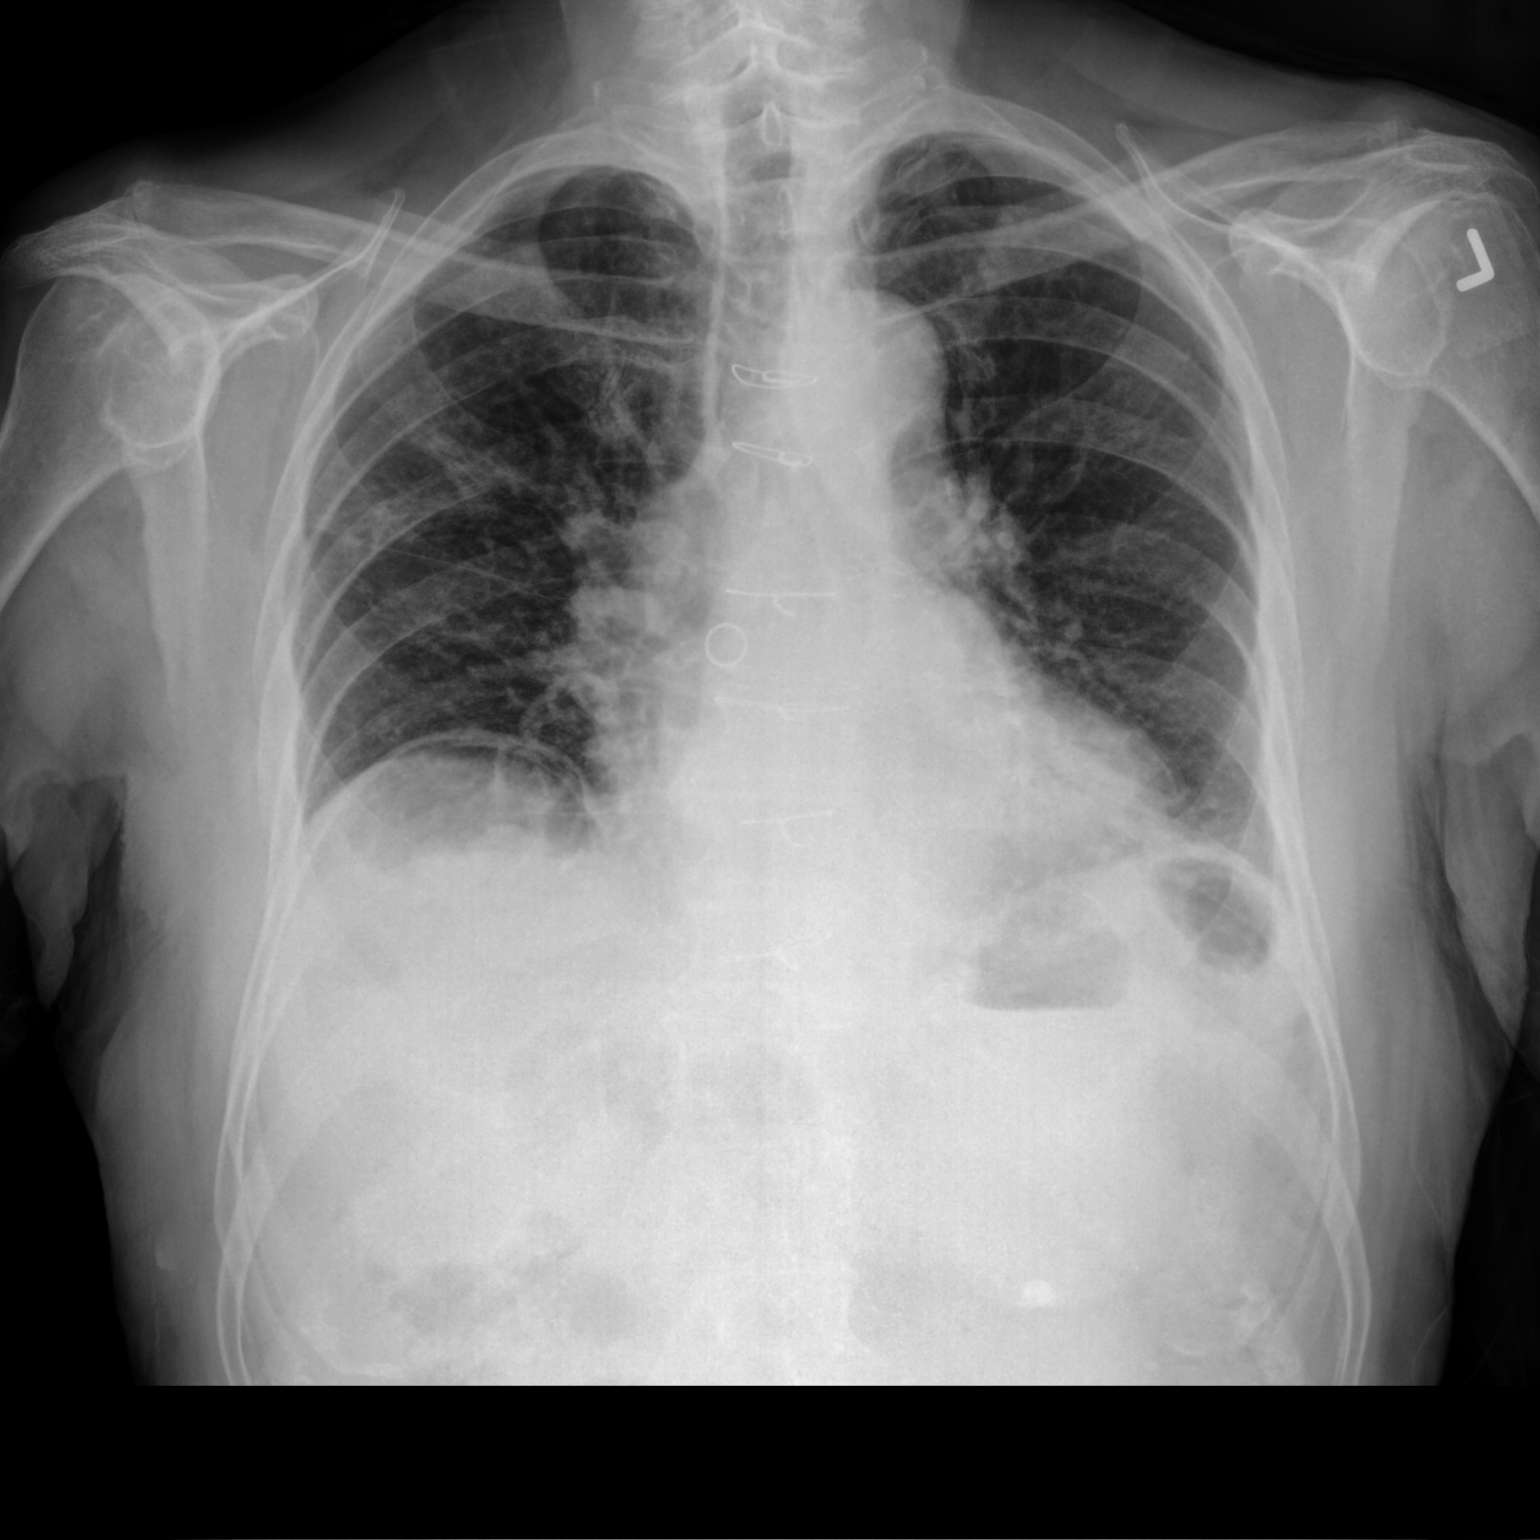

[chest lat]
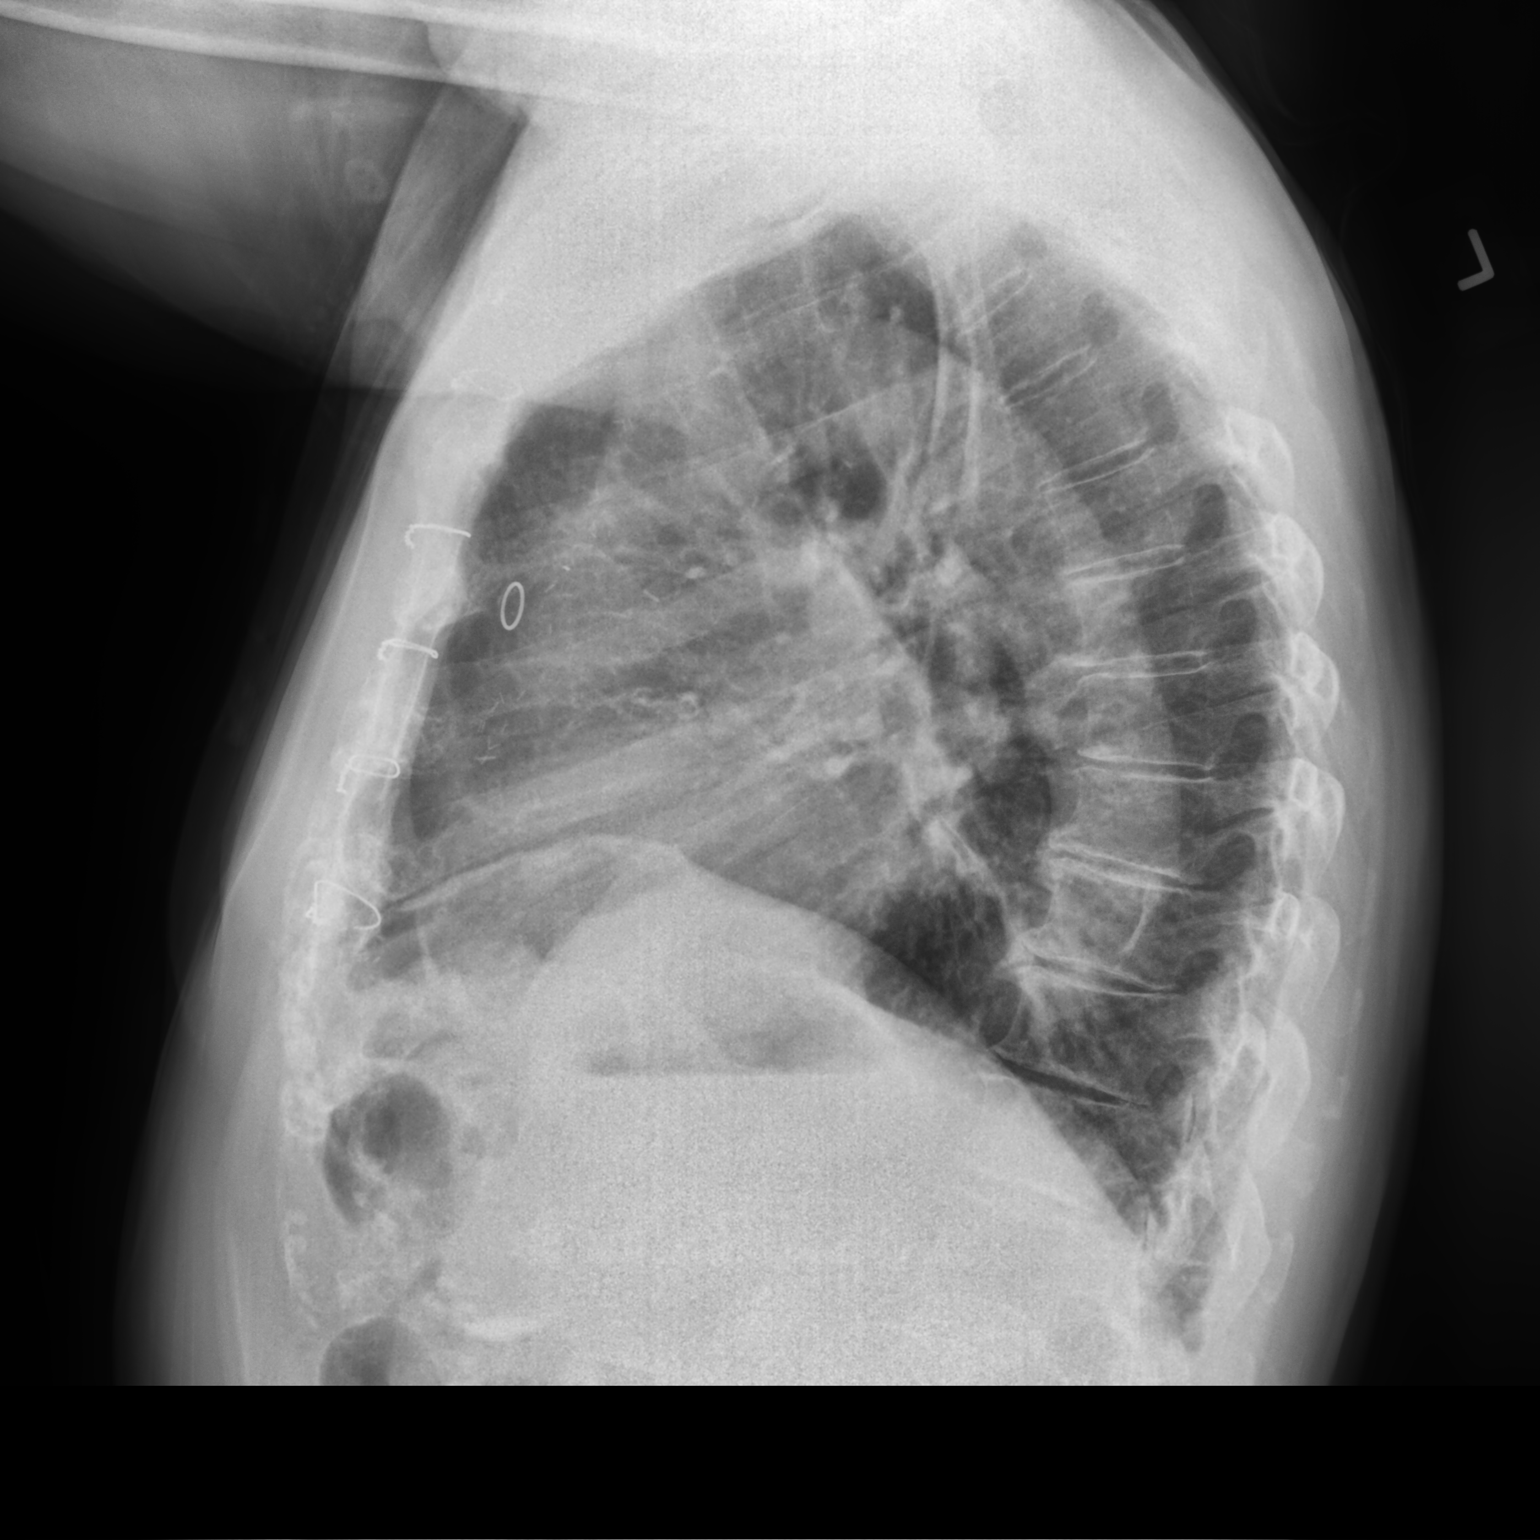

[2 of 2 positions shown; findings below may reference images not displayed]

FINDINGS: Trachea is midline. Heart is at the upper limits of normal in size
to mildly enlarged. Lungs are low in volume with streaky densities
in the right perihilar region and left lower lobe. No dense airspace
consolidation or pleural fluid.

Gas in the colon is seen below the right hemidiaphragm. There may be
a stone in the lower pole left kidney.
IMPRESSION: 1. Streaky atelectasis or scarring in the right perihilar region and
left lung base.
2. Possible stone in the left kidney.

## 2023-12-09 IMAGING — CT CT HEAD W/O CM
4 series · 16 of 47 positions shown, 18 images · non-contrast
Comparison: 05/20/2021

CLINICAL DATA: Chronic subdural hematoma



[Series 3: head wo · axial · 0.43mm/px · z∈[-79,+41]mm · 7 of 33 slices shown, 9 images]
[im 5/33  brain]
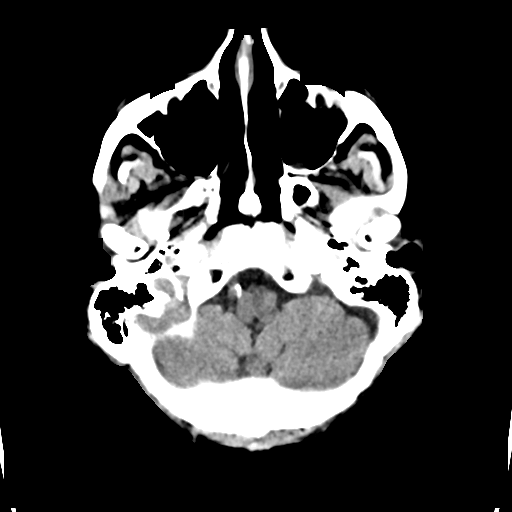
[im 5/33  bone]
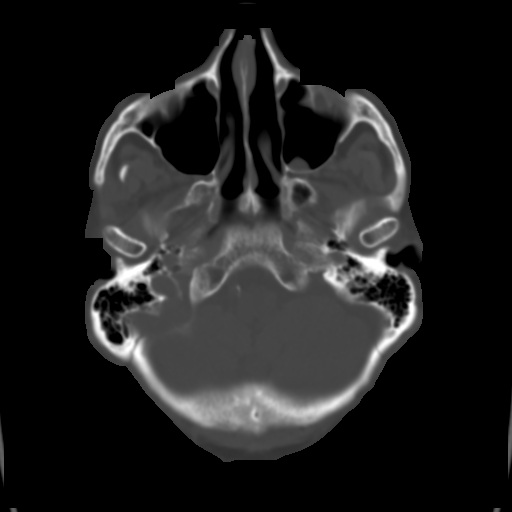
[im 9/33  brain]
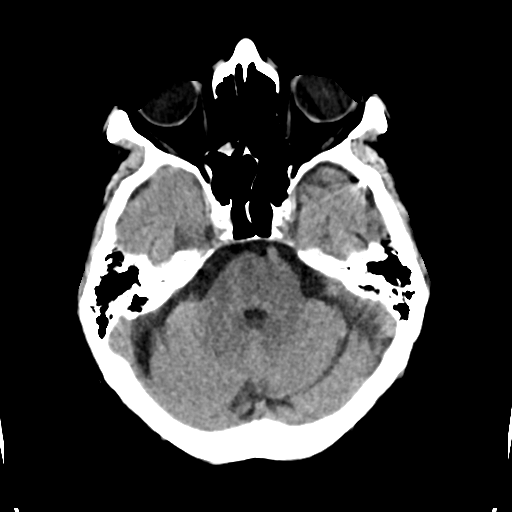
[im 13/33  brain]
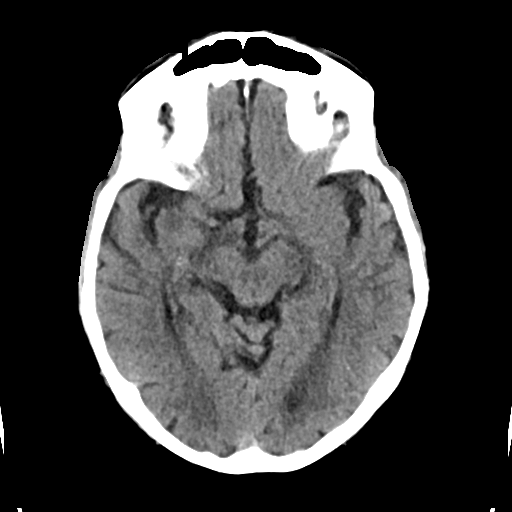
[im 17/33  brain]
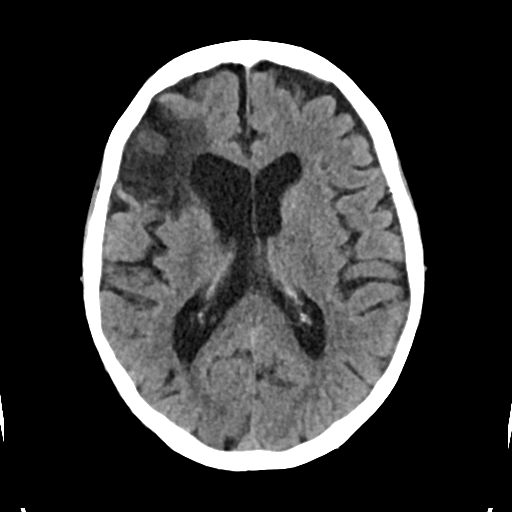
[im 21/33  brain]
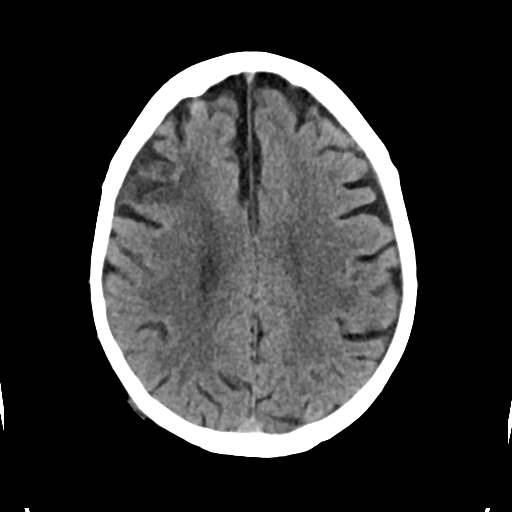
[im 21/33  bone]
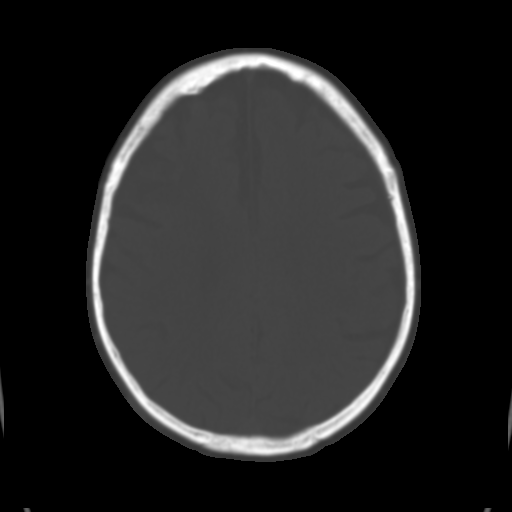
[im 25/33  brain]
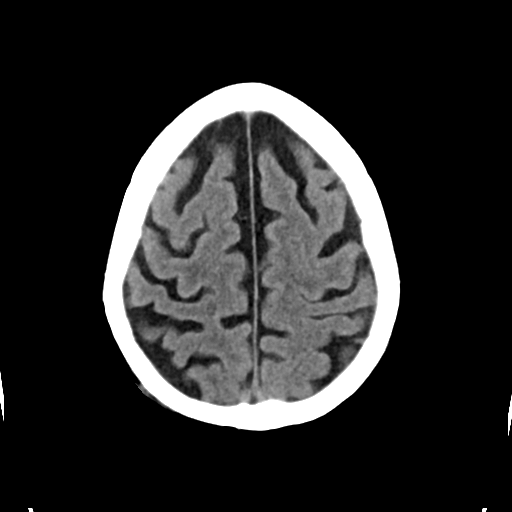
[im 29/33  brain]
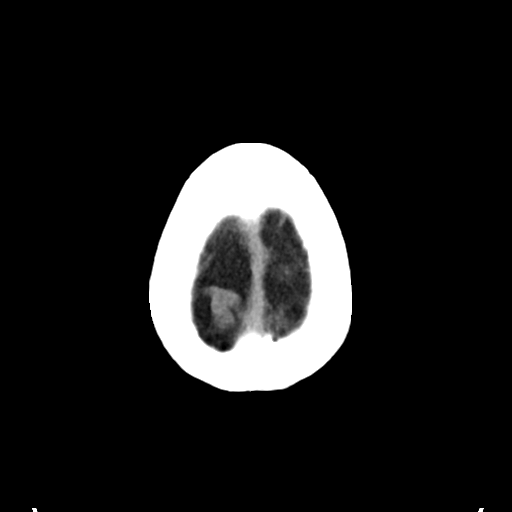

[Series 4: head bone · axial · 0.43mm/px · z∈[-83,-51]mm · 3 of 83 slices shown]
[im 9/83  bone]
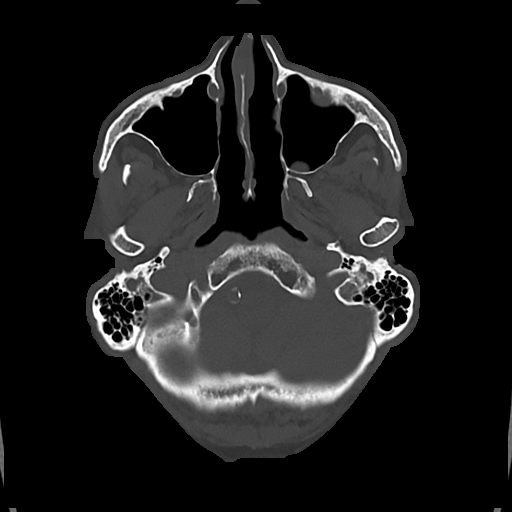
[im 17/83  bone]
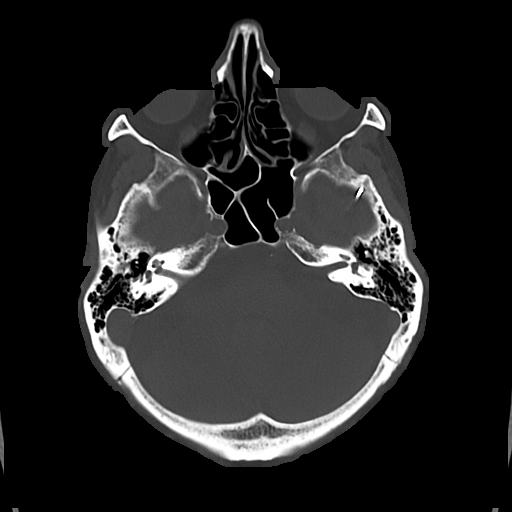
[im 25/83  bone]
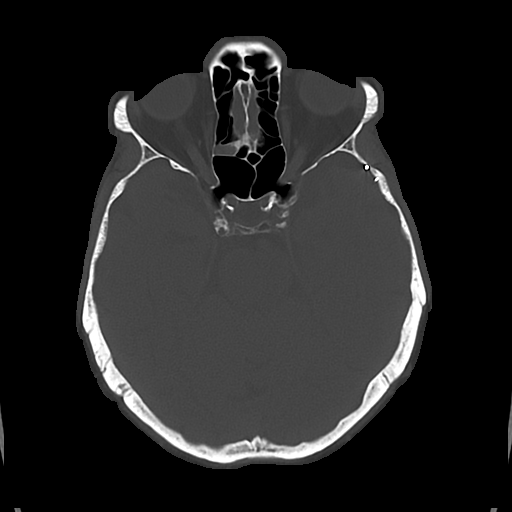

[Series 5: cor soft · coronal · 0.31mm/px · 3 of 71 slices shown]
[im 24/71  brain]
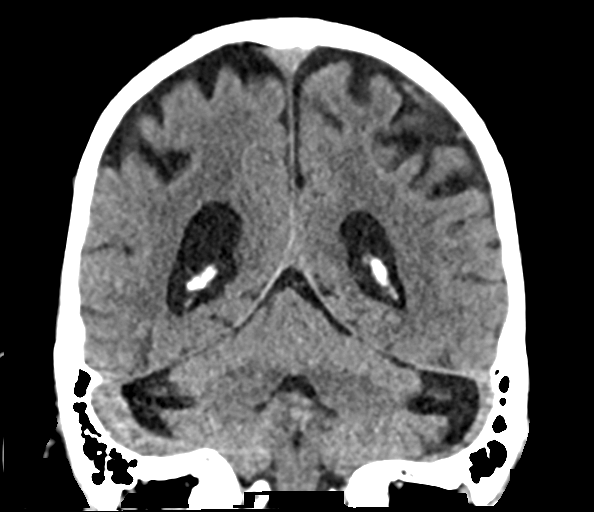
[im 32/71  brain]
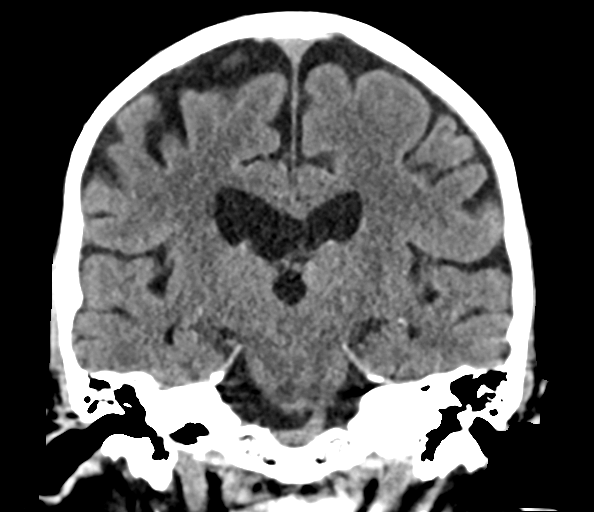
[im 39/71  brain]
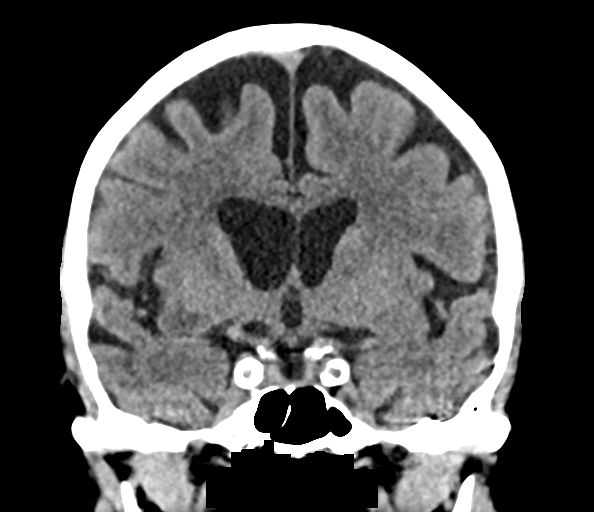

[Series 6: sag soft · sagittal · 0.31mm/px · 3 of 61 slices shown]
[im 21/61  brain]
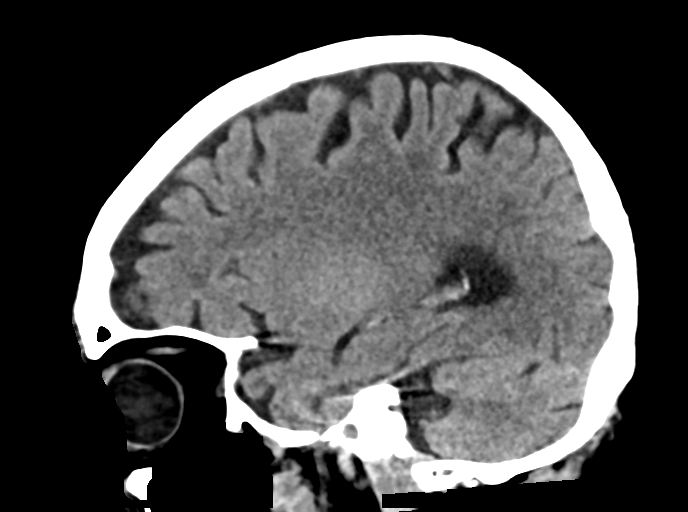
[im 31/61  brain]
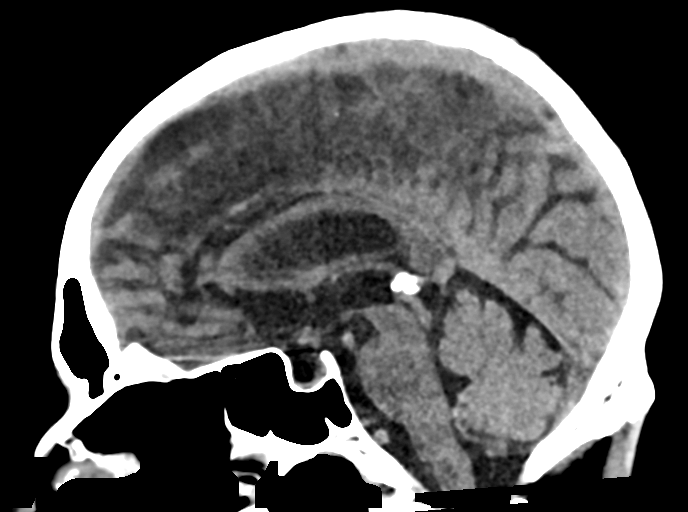
[im 41/61  brain]
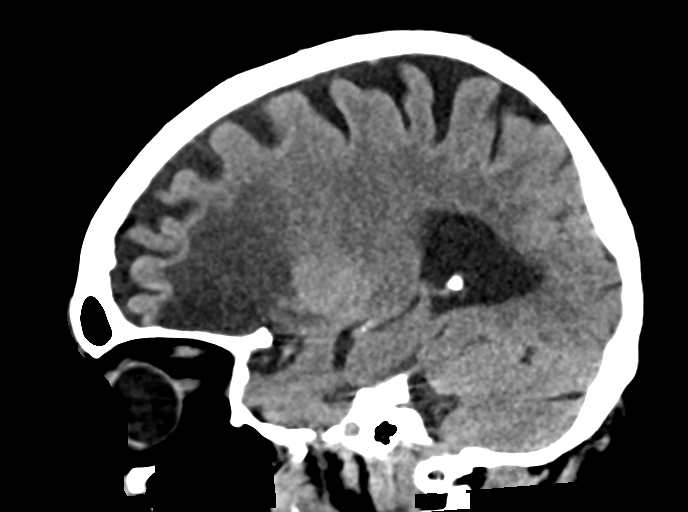

[16 of 47 positions shown; findings below may reference images not displayed]

FINDINGS: Brain: No change in appearance of the brain itself. Chronic
small-vessel ischemic changes affect the pons. No cerebellar insult.
Old infarction of the anterior insula and right frontal operculum.
Chronic small-vessel ischemic changes of the hemispheric deep white
matter. No acute infarction or mass lesion. Previously seen
left-sided subdural hematoma has nearly completely resolved. Very
minimal residual component along the lateral frontal convexity.
Evidence of previous middle meningeal artery embolization.

Vascular: There is atherosclerotic calcification of the major
vessels at the base of the brain.

Skull: Negative

Sinuses/Orbits: Clear/normal

Other: None
IMPRESSION: Near complete resolution of the left-sided subdural hematoma. Very
minimal residual subdural material at the lateral frontal convexity.
Previous middle meningeal artery embolization.
